# Patient Record
Sex: Male | Born: 1995 | Race: Asian | Hispanic: No | Marital: Single | Smoking: Never smoker
Health system: Southern US, Community
[De-identification: ages and names within clinical notes are randomized; demographics above are authoritative.]

## PROBLEM LIST (undated history)

## (undated) DIAGNOSIS — S83519A Sprain of anterior cruciate ligament of unspecified knee, initial encounter: Secondary | ICD-10-CM

## (undated) HISTORY — PX: WISDOM TOOTH EXTRACTION: SHX21

---
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
## 2020-03-13 ENCOUNTER — Ambulatory Visit (HOSPITAL_COMMUNITY)
Admission: EM | Admit: 2020-03-13 | Discharge: 2020-03-13 | Disposition: A | Discharge status: Home / Self Care | Payer: Self-pay | Attending: Internal Medicine | Admitting: Internal Medicine

## 2020-03-13 ENCOUNTER — Encounter (HOSPITAL_COMMUNITY): Payer: Self-pay | Admitting: *Deleted

## 2020-03-13 ENCOUNTER — Ambulatory Visit (INDEPENDENT_AMBULATORY_CARE_PROVIDER_SITE_OTHER): Payer: Self-pay

## 2020-03-13 ENCOUNTER — Other Ambulatory Visit: Payer: Self-pay

## 2020-03-13 DIAGNOSIS — S86911A Strain of unspecified muscle(s) and tendon(s) at lower leg level, right leg, initial encounter: Secondary | ICD-10-CM

## 2020-03-13 DIAGNOSIS — M25561 Pain in right knee: Secondary | ICD-10-CM

## 2020-03-13 MED ORDER — TIZANIDINE HCL 2 MG PO CAPS: 2.0000 mg | ORAL_CAPSULE | Freq: Three times a day (TID) | ORAL | 0 refills | Status: DC

## 2020-03-13 NOTE — ED Provider Notes (Signed)
[facility]    CSN: [id number] Arrival date & time: 03/13/20  1007      History   Chief Complaint Chief Complaint  Patient presents with  . Leg Pain    Rt    HPI Dean Nichols is a 25 y.o. male presenting with R knee pain and swelling x3-4 weeks, with current exacerbation. States  he was playing basketball 3 to 4 weeks ago, when he fell onto his R kneecap.  States he heard a pop when this happened.  He used a brace and this felt better; however, 3 days ago he played basketball again, and when he landed from a jump he heard another pop and immediately felt pain again.  Today states that the knee looks swollen. denies sensation changes. Has been using brace at home with some improvement. Denies injury elsewhere, head trauma, LOC, headaches. No history of prior injuries or surgeries to this knee.  HPI  History reviewed. No pertinent past medical history.  There are no problems to display for this patient.   History reviewed. No pertinent surgical history.     Home Medications    Prior to Admission medications   Medication Sig Start Date End Date Taking? Authorizing Provider  tizanidine (ZANAFLEX) 2 MG capsule Take 1 capsule (2 mg total) by mouth 3 (three) times daily. 03/13/20  Yes Rhys Martini, PA-C    Family History History reviewed. No pertinent family history.  Social History Social History   Tobacco Use  . Smoking status: Never Smoker  . Smokeless tobacco: Never Used     Allergies   Patient has no known allergies.   Review of Systems Review of Systems  Musculoskeletal:       R knee pain and swelling  All other systems reviewed and are negative.    Physical Exam Triage Vital Signs ED Triage Vitals  Enc Vitals Group     BP 03/13/20 1036 135/75     Pulse Rate 03/13/20 1036 65     Resp 03/13/20 1036 16     Temp 03/13/20 1036 98.4 F (36.9 C)     Temp Source 03/13/20 1036 Oral     SpO2 03/13/20 1036 100 %     Weight --      Height  --      Head Circumference --      Peak Flow --      Pain Score 03/13/20 1038 7     Pain Loc --      Pain Edu? --      Excl. in GC? --    No data found.  Updated Vital Signs BP 135/75 (BP Location: Right Arm)   Pulse 65   Temp 98.4 F (36.9 C) (Oral)   Resp 16   SpO2 100%   Visual Acuity Right Eye Distance:   Left Eye Distance:   Bilateral Distance:    Right Eye Near:   Left Eye Near:    Bilateral Near:     Physical Exam Vitals reviewed.  Constitutional:      Appearance: Normal appearance.  Cardiovascular:     Rate and Rhythm: Normal rate and regular rhythm.     Heart sounds: Normal heart sounds.  Pulmonary:     Effort: Pulmonary effort is normal.     Breath sounds: Normal breath sounds.  Musculoskeletal:     Right knee: Swelling and effusion present. No deformity, erythema, ecchymosis, lacerations, bony tenderness or crepitus. Normal range of motion. Tenderness present. No  medial joint line, lateral joint line, MCL, LCL, ACL, PCL or patellar tendon tenderness. No LCL laxity, MCL laxity, ACL laxity or PCL laxity. Normal alignment, normal meniscus and normal patellar mobility. Normal pulse.     Instability Tests: Anterior drawer test negative. Posterior drawer test negative. Anterior Lachman test negative. Medial McMurray test negative and lateral McMurray test negative.     Left knee: Normal.     Comments: R knee mildly swollen, TTP distal hamstring. ROM intact but pain with full extension. Sensation intact, neurovascularly intact.   Neurological:     General: No focal deficit present.     Mental Status: He is alert and oriented to person, place, and time.  Psychiatric:        Mood and Affect: Mood normal.        Behavior: Behavior normal.        Thought Content: Thought content normal.        Judgment: Judgment normal.      UC Treatments / Results  Labs (all labs ordered are listed, but only abnormal results are displayed) Labs Reviewed - No data to  display  EKG   Radiology DG Knee Complete 4 Views Right  Result Date: 03/13/2020 CLINICAL DATA:  Right knee pain. EXAM: RIGHT KNEE - COMPLETE 4+ VIEW COMPARISON:  None. FINDINGS: Right knee is located without a fracture. There appears to be a suprapatellar joint effusion. Alignment is normal. No significant joint space narrowing or degenerative changes. IMPRESSION: Probable suprapatellar joint effusion without acute bone abnormality. Electronically Signed   By: Richarda Overlie M.D.   On: 03/13/2020 11:01    Procedures Procedures (including critical care time)  Medications Ordered in UC Medications - No data to display  Initial Impression / Assessment and Plan / UC Course  I have reviewed the triage vital signs and the nursing notes.  Pertinent labs & imaging results that were available during my care of the patient were reviewed by me and considered in my medical decision making (see chart for details).      25 year old male presenting with R knee pain x3-4 weeks following injury, with repeat injury 3 days ago.  Xray R knee- Probable suprapatellar joint effusion without acute bone abnormality.  Discussed option of new knee brace today; pt prefers to use the one he already has. RICE. F/u with ortho given duration of symptoms; information provided.  Spent over 40 minutes obtaining H&P, performing physical, interpreting films, discussing results, treatment plan and plan for follow-up with patient. Patient agrees with plan.     Final Clinical Impressions(s) / UC Diagnoses   Final diagnoses:  Knee strain, right, initial encounter     Discharge Instructions     -Continue knee brace. Also try ice, resting the leg as much as possible, and tylenol/ibuprofen as needed.  -You can also try the muscle relaxer- Tizanidine (Zanaflex), up to 3x daily. This can make you drowsy so take when you don't need to drive or operate machinery. -If no improvement in 5-7 days, contact ortho for  follow-up appointment (information below).    ED Prescriptions    Medication Sig Dispense Auth. Provider   tizanidine (ZANAFLEX) 2 MG capsule Take 1 capsule (2 mg total) by mouth 3 (three) times daily. 21 capsule Rhys Martini, PA-C     PDMP not reviewed this encounter.   Rhys Martini, PA-C 03/13/20 1139

## 2020-03-13 NOTE — ED Triage Notes (Signed)
Pt reports Rt knee pain is worse over last 3 days. Pt injured Rt knee 3-4 weeks ago playing basket ball . P thas a brace on knee on arrival to room.

## 2020-03-13 NOTE — Discharge Instructions (Addendum)
-  Continue knee brace. Also try ice, resting the leg as much as possible, and tylenol/ibuprofen as needed.  -You can also try the muscle relaxer- Tizanidine (Zanaflex), up to 3x daily. This can make you drowsy so take when you don't need to drive or operate machinery. -If no improvement in 5-7 days, contact ortho for follow-up appointment (information below).

## 2020-03-19 ENCOUNTER — Ambulatory Visit (INDEPENDENT_AMBULATORY_CARE_PROVIDER_SITE_OTHER): Payer: Self-pay | Admitting: Family Medicine

## 2020-03-19 ENCOUNTER — Other Ambulatory Visit: Payer: Self-pay

## 2020-03-19 VITALS — BP 122/70 | Ht 72.0 in | Wt 155.0 lb

## 2020-03-19 DIAGNOSIS — M25561 Pain in right knee: Secondary | ICD-10-CM

## 2020-03-19 NOTE — Progress Notes (Addendum)
Office Visit Note   Patient: Dean Nichols           Date of Birth: 1995-11-05           MRN: [id number] Visit Date: 03/19/2020 Requested by: No referring provider defined for this encounter. PCP: Pcp, No  Subjective: CC: Right Knee Pain  HPI: 25 year old male presenting to clinic with concerns of approximately 1 month of right knee pain.  Patient states he was playing basketball, went up for a lay up and fell down awkwardly on his right leg.  He says he immediately had to stop playing and get a ride home.  He noticed his knee became very swollen, and had trouble walking on the leg for several days afterward.  He bought a knee brace over-the-counter, and says that this offered some support, so he decided to try playing basketball on his leg again.  He says while he was playing he felt another "pop" within his right knee, accompanied by similar severe pain, and return of significant swelling.  He then presented to the urgent care for evaluation, where he was diagnosed with a knee strain and recommended to follow-up here.  He has been wearing his knee brace, which she says offer significant improvement to his symptoms.  Denies any catching or locking of the knee.  States that his knee feels "unstable" when he is not wearing his knee brace.  He is also been trying to ice the knee, which helps with the comfort but now with the swelling.  He says otherwise he is healthy, with no additional concerns today.              ROS:   All other systems were reviewed and are negative.  Objective: Vital Signs: BP 122/70   Ht 6' (1.829 m)   Wt 155 lb (70.3 kg)   BMI 21.02 kg/m   Physical Exam:  General:  Alert and oriented, in no acute distress. Pulm:  Breathing unlabored. Psy:  Normal mood, congruent affect. Skin:  Right knee with no bruising, rashes, or erythema. Overlying skin intact.   Right knee exam:  General: Antalgic gait favoring the right leg  Seated Exam:  No significant patellar crepitus,  Negative J-Sign.   Palpation: No significant tenderness to palpation over medial or lateral joint lines. No tenderness with palpation of patella or patellar tendon. No tenderness over patellar facets.   Supine exam: Large effusion, normal patellar mobility.   Ligamentous Exam:  Endorses pain and has laxity with anterior drawer.  Left knee without pain or laxity upon anterior drawer or Lachman.  No pain or laxity with varus/valgus stress across the knee.   Meniscus:  McMurray with no pain or deep clicking.   Strength: Hip flexion (L1), Hip Aduction (L2), Knee Extension (L3) are 5/5 Bilaterally Foot Inversion (L4), Dorsiflexion (L5), and Eversion (S1) 5/5 Bilaterally  Sensation: Intact to light touch medial and lateral aspects of lower extremities, and lateral, dorsal, and medial aspects of foot.    Imaging: No results found.  Assessment & Plan: 25 year old male presenting to clinic with 1 month of right knee pain after awkward landing while playing basketball.  Examination with a large effusion as well as laxity upon Lachman's and anterior drawer.  Concern for ACL tear.   -Given his age and active lifestyle, recommend that patient speak with orthopedic surgeon to consider repair.  Referral placed to [facility], for assistance with evaluation and treatment. -Continue to wear previously obtained knee  brace.  Provided with crutches. -Patient was agreeable with plan, and no further questions or concerns today.   I was the preceptor for this visit and available for immediate consultation Marsa Aris, DO

## 2020-03-19 NOTE — Patient Instructions (Signed)
Thank you for visiting [place] today! I am concerned that you may have torn your ACL, major supportive ligament within the knee. A referral has been placed for you to see an orthopedic surgeon to assist with evaluation of this injury.  While you are waiting for this referral, continue to wear your knee brace and ice your knee.  Use crutches if you feel that you are limping.

## 2021-09-29 IMAGING — DX DG KNEE COMPLETE 4+V*R*
5 series · 5 of 5 positions shown · non-contrast
Comparison: None.

CLINICAL DATA: Right knee pain.

EXAM:
RIGHT KNEE - COMPLETE 4+ VIEW

[knee ap]
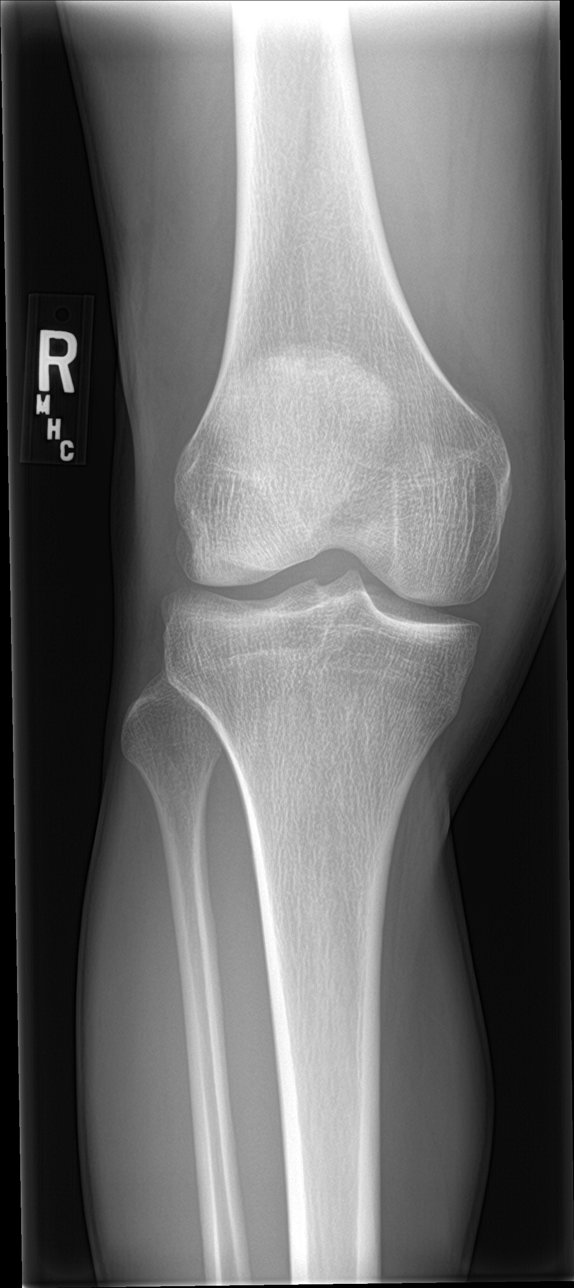

[knee obl (1 of 2)]
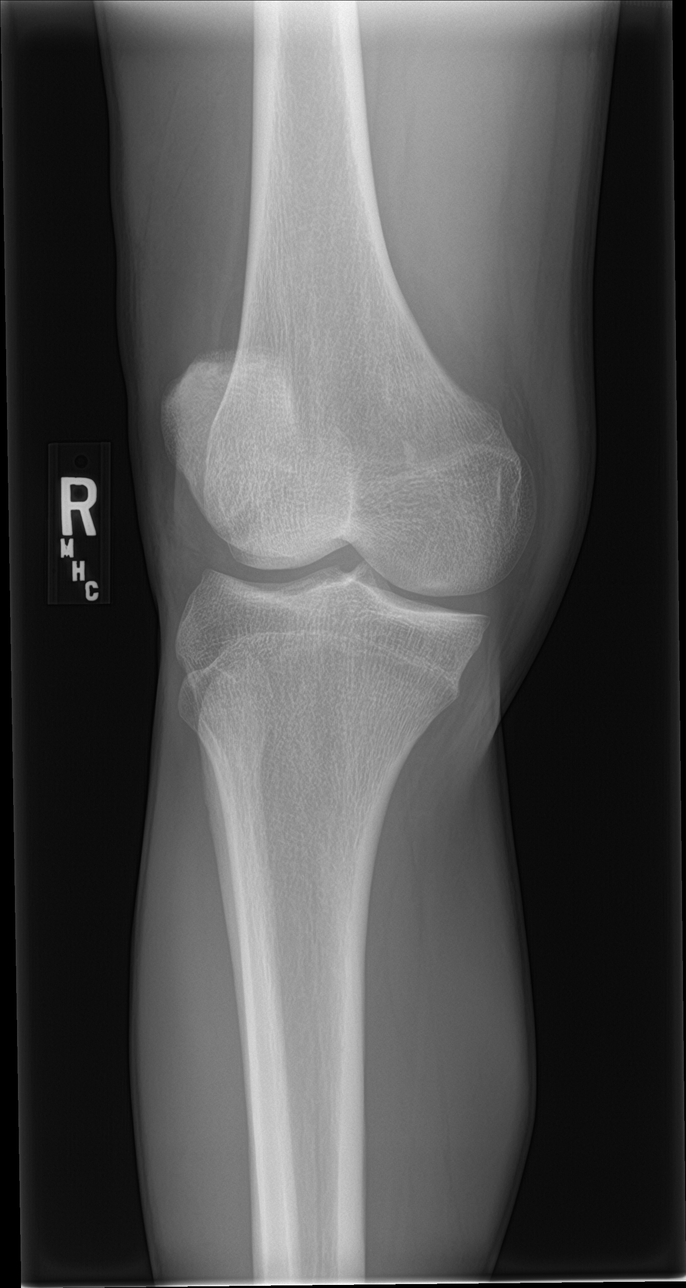

[knee obl (2 of 2)]
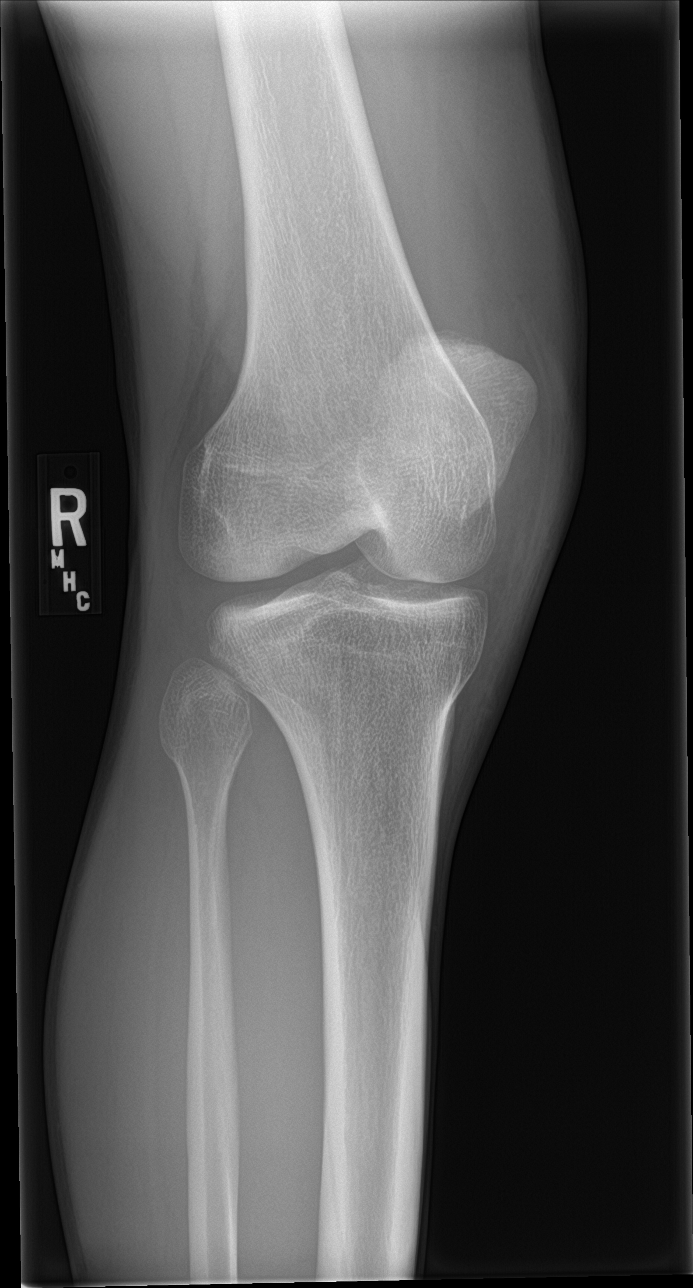

[knee lat]
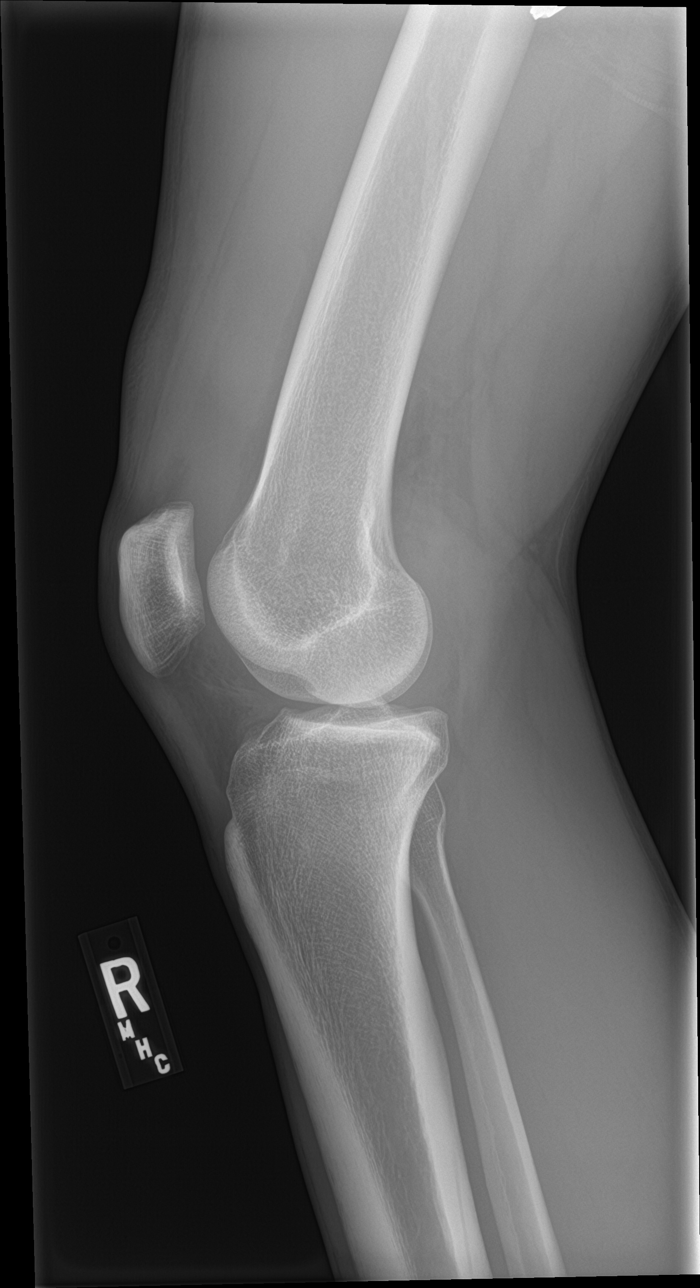

[knee sunrise]
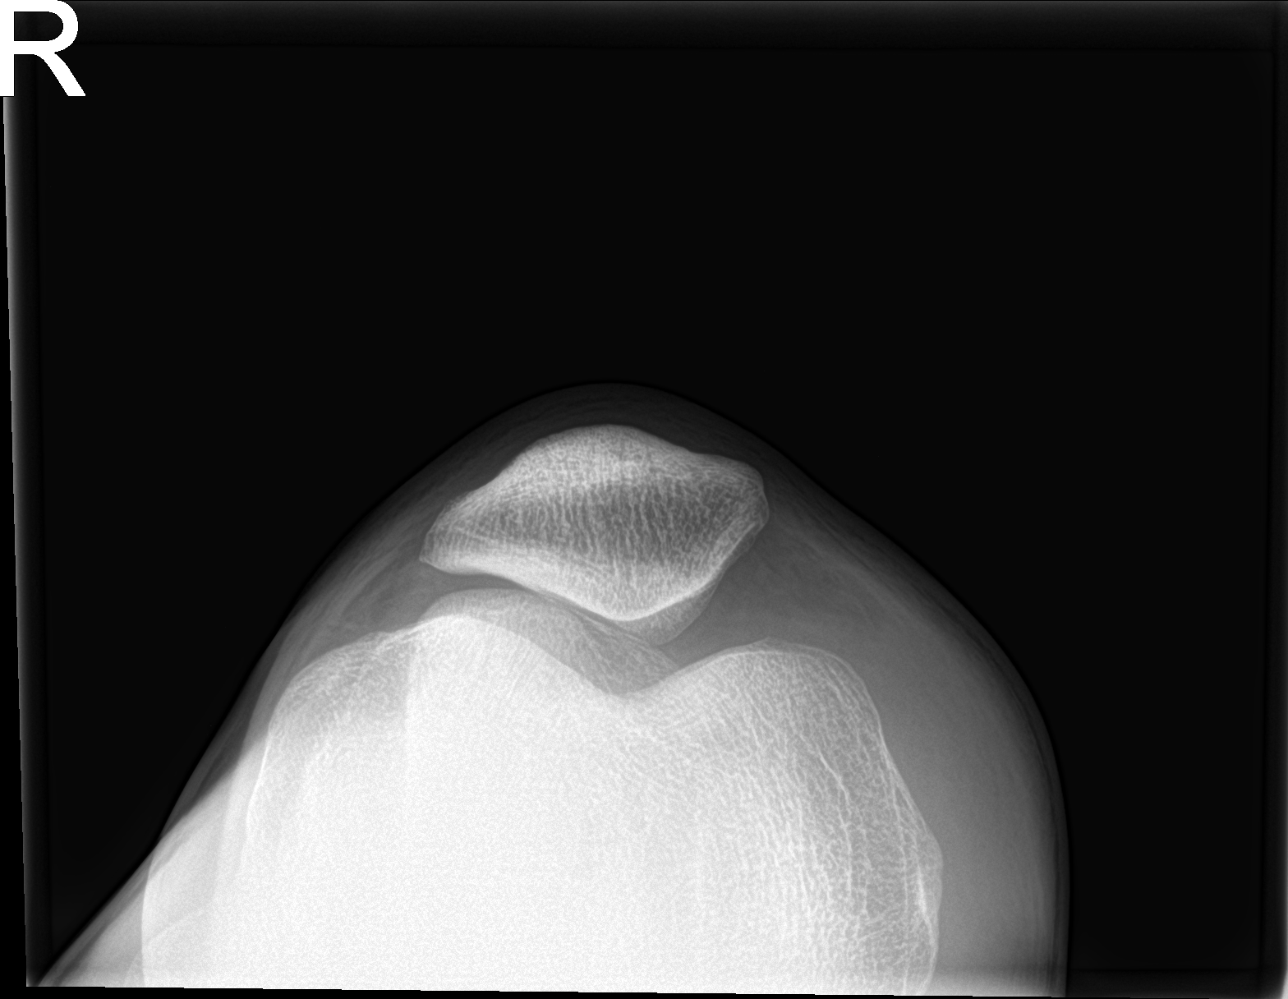

[5 of 5 positions shown; findings below may reference images not displayed]

FINDINGS: Right knee is located without a fracture. There appears to be a
suprapatellar joint effusion. Alignment is normal. No significant
joint space narrowing or degenerative changes.
IMPRESSION: Probable suprapatellar joint effusion without acute bone
abnormality.

## 2021-12-16 DIAGNOSIS — Z419 Encounter for procedure for purposes other than remedying health state, unspecified: Secondary | ICD-10-CM | POA: Diagnosis not present

## 2022-01-16 DIAGNOSIS — Z419 Encounter for procedure for purposes other than remedying health state, unspecified: Secondary | ICD-10-CM | POA: Diagnosis not present

## 2022-02-16 DIAGNOSIS — Z419 Encounter for procedure for purposes other than remedying health state, unspecified: Secondary | ICD-10-CM | POA: Diagnosis not present

## 2022-03-17 DIAGNOSIS — Z419 Encounter for procedure for purposes other than remedying health state, unspecified: Secondary | ICD-10-CM | POA: Diagnosis not present

## 2022-04-17 DIAGNOSIS — Z419 Encounter for procedure for purposes other than remedying health state, unspecified: Secondary | ICD-10-CM | POA: Diagnosis not present

## 2022-05-17 DIAGNOSIS — Z419 Encounter for procedure for purposes other than remedying health state, unspecified: Secondary | ICD-10-CM | POA: Diagnosis not present

## 2022-05-23 ENCOUNTER — Other Ambulatory Visit: Payer: Self-pay

## 2022-06-17 DIAGNOSIS — Z419 Encounter for procedure for purposes other than remedying health state, unspecified: Secondary | ICD-10-CM | POA: Diagnosis not present

## 2022-06-20 ENCOUNTER — Ambulatory Visit: Payer: Commercial Managed Care - HMO | Admitting: Family Medicine

## 2022-06-20 ENCOUNTER — Telehealth: Payer: Self-pay | Admitting: Family Medicine

## 2022-06-20 NOTE — Telephone Encounter (Signed)
6.4.24 new pt no show no letter sent but block for future schedule

## 2022-07-17 DIAGNOSIS — Z419 Encounter for procedure for purposes other than remedying health state, unspecified: Secondary | ICD-10-CM | POA: Diagnosis not present

## 2022-08-17 DIAGNOSIS — Z419 Encounter for procedure for purposes other than remedying health state, unspecified: Secondary | ICD-10-CM | POA: Diagnosis not present

## 2022-09-17 DIAGNOSIS — Z419 Encounter for procedure for purposes other than remedying health state, unspecified: Secondary | ICD-10-CM | POA: Diagnosis not present

## 2022-10-17 DIAGNOSIS — Z419 Encounter for procedure for purposes other than remedying health state, unspecified: Secondary | ICD-10-CM | POA: Diagnosis not present

## 2022-10-18 ENCOUNTER — Emergency Department (HOSPITAL_BASED_OUTPATIENT_CLINIC_OR_DEPARTMENT_OTHER): Payer: Commercial Managed Care - HMO | Admitting: Radiology

## 2022-10-18 ENCOUNTER — Inpatient Hospital Stay (HOSPITAL_BASED_OUTPATIENT_CLINIC_OR_DEPARTMENT_OTHER)
Admission: EM | Admit: 2022-10-18 | Discharge: 2022-10-30 | DRG: 853 | Disposition: A | Discharge status: Home Health | Payer: Commercial Managed Care - HMO | Attending: Family Medicine | Admitting: Family Medicine

## 2022-10-18 ENCOUNTER — Encounter (HOSPITAL_BASED_OUTPATIENT_CLINIC_OR_DEPARTMENT_OTHER): Payer: Self-pay

## 2022-10-18 ENCOUNTER — Other Ambulatory Visit: Payer: Self-pay

## 2022-10-18 ENCOUNTER — Emergency Department (HOSPITAL_BASED_OUTPATIENT_CLINIC_OR_DEPARTMENT_OTHER): Payer: Commercial Managed Care - HMO

## 2022-10-18 DIAGNOSIS — B950 Streptococcus, group A, as the cause of diseases classified elsewhere: Secondary | ICD-10-CM

## 2022-10-18 DIAGNOSIS — R6521 Severe sepsis with septic shock: Secondary | ICD-10-CM | POA: Diagnosis not present

## 2022-10-18 DIAGNOSIS — Z23 Encounter for immunization: Secondary | ICD-10-CM

## 2022-10-18 DIAGNOSIS — K7200 Acute and subacute hepatic failure without coma: Secondary | ICD-10-CM | POA: Diagnosis not present

## 2022-10-18 DIAGNOSIS — A400 Sepsis due to streptococcus, group A: Principal | ICD-10-CM | POA: Diagnosis present

## 2022-10-18 DIAGNOSIS — E876 Hypokalemia: Secondary | ICD-10-CM | POA: Diagnosis not present

## 2022-10-18 DIAGNOSIS — S81011A Laceration without foreign body, right knee, initial encounter: Secondary | ICD-10-CM | POA: Diagnosis not present

## 2022-10-18 DIAGNOSIS — A419 Sepsis, unspecified organism: Secondary | ICD-10-CM | POA: Diagnosis not present

## 2022-10-18 DIAGNOSIS — R7989 Other specified abnormal findings of blood chemistry: Secondary | ICD-10-CM | POA: Diagnosis not present

## 2022-10-18 DIAGNOSIS — W268XXA Contact with other sharp object(s), not elsewhere classified, initial encounter: Secondary | ICD-10-CM

## 2022-10-18 DIAGNOSIS — M726 Necrotizing fasciitis: Secondary | ICD-10-CM | POA: Diagnosis not present

## 2022-10-18 DIAGNOSIS — E871 Hypo-osmolality and hyponatremia: Secondary | ICD-10-CM | POA: Diagnosis present

## 2022-10-18 DIAGNOSIS — K130 Diseases of lips: Secondary | ICD-10-CM | POA: Diagnosis not present

## 2022-10-18 DIAGNOSIS — M25561 Pain in right knee: Secondary | ICD-10-CM | POA: Diagnosis not present

## 2022-10-18 DIAGNOSIS — N179 Acute kidney failure, unspecified: Secondary | ICD-10-CM | POA: Diagnosis not present

## 2022-10-18 DIAGNOSIS — R000 Tachycardia, unspecified: Secondary | ICD-10-CM | POA: Diagnosis not present

## 2022-10-18 DIAGNOSIS — M7989 Other specified soft tissue disorders: Secondary | ICD-10-CM | POA: Diagnosis not present

## 2022-10-18 DIAGNOSIS — L03115 Cellulitis of right lower limb: Secondary | ICD-10-CM | POA: Diagnosis not present

## 2022-10-18 DIAGNOSIS — L0390 Cellulitis, unspecified: Secondary | ICD-10-CM

## 2022-10-18 DIAGNOSIS — K820 Obstruction of gallbladder: Secondary | ICD-10-CM | POA: Diagnosis not present

## 2022-10-18 DIAGNOSIS — R7881 Bacteremia: Secondary | ICD-10-CM | POA: Diagnosis not present

## 2022-10-18 DIAGNOSIS — R600 Localized edema: Secondary | ICD-10-CM | POA: Diagnosis not present

## 2022-10-18 HISTORY — DX: Sprain of anterior cruciate ligament of unspecified knee, initial encounter: S83.519A

## 2022-10-18 LAB — CBC WITH DIFFERENTIAL/PLATELET
Abs Immature Granulocytes: 0.9 10*3/uL — ABNORMAL HIGH (ref 0.00–0.07)
Band Neutrophils: 20 %
Basophils Absolute: 0 10*3/uL (ref 0.0–0.1)
Basophils Relative: 0 %
Eosinophils Absolute: 0 10*3/uL (ref 0.0–0.5)
Eosinophils Relative: 0 %
HCT: 44.9 % (ref 39.0–52.0)
Hemoglobin: 16.2 g/dL (ref 13.0–17.0)
Lymphocytes Relative: 0 %
Lymphs Abs: 0 10*3/uL — ABNORMAL LOW (ref 0.7–4.0)
MCH: 31.2 pg (ref 26.0–34.0)
MCHC: 36.1 g/dL — ABNORMAL HIGH (ref 30.0–36.0)
MCV: 86.3 fL (ref 80.0–100.0)
Metamyelocytes Relative: 4 %
Monocytes Absolute: 0.7 10*3/uL (ref 0.1–1.0)
Monocytes Relative: 4 %
Myelocytes: 1 %
Neutro Abs: 16.3 10*3/uL — ABNORMAL HIGH (ref 1.7–7.7)
Neutrophils Relative %: 71 %
Platelets: 227 10*3/uL (ref 150–400)
RBC Morphology: NONE SEEN
RBC: 5.2 MIL/uL (ref 4.22–5.81)
RDW: 12.2 % (ref 11.5–15.5)
WBC Morphology: INCREASED
WBC: 17.9 10*3/uL — ABNORMAL HIGH (ref 4.0–10.5)
nRBC: 0 % (ref 0.0–0.2)

## 2022-10-18 LAB — COMPREHENSIVE METABOLIC PANEL
ALT: 19 U/L (ref 0–44)
AST: 21 U/L (ref 15–41)
Albumin: 4.6 g/dL (ref 3.5–5.0)
Alkaline Phosphatase: 70 U/L (ref 38–126)
Anion gap: 13 (ref 5–15)
BUN: 25 mg/dL — ABNORMAL HIGH (ref 6–20)
CO2: 25 mmol/L (ref 22–32)
Calcium: 9.5 mg/dL (ref 8.9–10.3)
Chloride: 93 mmol/L — ABNORMAL LOW (ref 98–111)
Creatinine, Ser: 1.5 mg/dL — ABNORMAL HIGH (ref 0.61–1.24)
GFR, Estimated: >60 mL/min (ref >60)
Glucose, Bld: 113 mg/dL — ABNORMAL HIGH (ref 70–99)
Potassium: 3.7 mmol/L (ref 3.5–5.1)
Sodium: 131 mmol/L — ABNORMAL LOW (ref 135–145)
Total Bilirubin: 1.2 mg/dL (ref 0.3–1.2)
Total Protein: 8.5 g/dL — ABNORMAL HIGH (ref 6.5–8.1)

## 2022-10-18 LAB — CBC
HCT: 37.9 % — ABNORMAL LOW (ref 39.0–52.0)
Hemoglobin: 13.8 g/dL (ref 13.0–17.0)
MCH: 31.4 pg (ref 26.0–34.0)
MCHC: 36.4 g/dL — ABNORMAL HIGH (ref 30.0–36.0)
MCV: 86.1 fL (ref 80.0–100.0)
Platelets: 174 10*3/uL (ref 150–400)
RBC: 4.4 MIL/uL (ref 4.22–5.81)
RDW: 12.1 % (ref 11.5–15.5)
WBC: 13.9 10*3/uL — ABNORMAL HIGH (ref 4.0–10.5)
nRBC: 0 % (ref 0.0–0.2)

## 2022-10-18 LAB — URINALYSIS, W/ REFLEX TO CULTURE (INFECTION SUSPECTED)
Bacteria, UA: NONE SEEN
Bilirubin Urine: NEGATIVE
Glucose, UA: NEGATIVE mg/dL
Nitrite: NEGATIVE
Protein, ur: 30 mg/dL — AB
Specific Gravity, Urine: 1.028 (ref 1.005–1.030)
Trans Epithel, UA: <1
pH: 6.5 (ref 5.0–8.0)

## 2022-10-18 LAB — LACTIC ACID, PLASMA
Lactic Acid, Venous: 3.1 mmol/L (ref 0.5–1.9)
Lactic Acid, Venous: 2 mmol/L (ref 0.5–1.9)

## 2022-10-18 LAB — CREATININE, SERUM
Creatinine, Ser: 1.43 mg/dL — ABNORMAL HIGH (ref 0.61–1.24)
GFR, Estimated: >60 mL/min (ref >60)

## 2022-10-18 LAB — PROTIME-INR
INR: 1.4 — ABNORMAL HIGH (ref 0.8–1.2)
Prothrombin Time: 17 s — ABNORMAL HIGH (ref 11.4–15.2)

## 2022-10-18 LAB — C-REACTIVE PROTEIN: CRP: 34.2 mg/dL — ABNORMAL HIGH (ref <1.0)

## 2022-10-18 LAB — HIV ANTIBODY (ROUTINE TESTING W REFLEX): HIV Screen 4th Generation wRfx: NONREACTIVE

## 2022-10-18 LAB — SEDIMENTATION RATE: Sed Rate: 26 mm/h — ABNORMAL HIGH (ref 0–16)

## 2022-10-18 MED ORDER — SODIUM CHLORIDE 0.9 % IV BOLUS
1000.0000 mL | Freq: Once | INTRAVENOUS | Status: AC
  Administered 2022-10-18: 1000 mL via INTRAVENOUS

## 2022-10-18 MED ORDER — ACETAMINOPHEN 650 MG RE SUPP: 650.0000 mg | Freq: Four times a day (QID) | RECTAL | Status: DC | PRN

## 2022-10-18 MED ORDER — ONDANSETRON HCL 4 MG/2ML IJ SOLN: 4.0000 mg | Freq: Four times a day (QID) | INTRAMUSCULAR | Status: DC | PRN

## 2022-10-18 MED ORDER — MORPHINE SULFATE (PF) 4 MG/ML IV SOLN
4.0000 mg | Freq: Once | INTRAVENOUS | Status: AC
  Administered 2022-10-18: 4 mg via INTRAVENOUS
  Filled 2022-10-18: qty 1

## 2022-10-18 MED ORDER — LACTATED RINGERS IV SOLN
150.0000 mL/h | INTRAVENOUS | Status: DC
  Administered 2022-10-18 – 2022-10-19 (×2): 150 mL/h via INTRAVENOUS

## 2022-10-18 MED ORDER — METRONIDAZOLE 500 MG/100ML IV SOLN
500.0000 mg | Freq: Once | INTRAVENOUS | Status: AC
  Administered 2022-10-18: 500 mg via INTRAVENOUS
  Filled 2022-10-18: qty 100

## 2022-10-18 MED ORDER — ACETAMINOPHEN 325 MG PO TABS
650.0000 mg | ORAL_TABLET | Freq: Four times a day (QID) | ORAL | Status: DC | PRN
  Administered 2022-10-19 – 2022-10-20 (×5): 650 mg via ORAL
  Filled 2022-10-18 (×5): qty 2

## 2022-10-18 MED ORDER — SODIUM CHLORIDE 0.9 % IV SOLN
2.0000 g | Freq: Once | INTRAVENOUS | Status: AC
  Administered 2022-10-18: 2 g via INTRAVENOUS
  Filled 2022-10-18: qty 12.5

## 2022-10-18 MED ORDER — LACTATED RINGERS IV BOLUS
500.0000 mL | Freq: Once | INTRAVENOUS | Status: AC
  Administered 2022-10-18: 500 mL via INTRAVENOUS

## 2022-10-18 MED ORDER — ACETAMINOPHEN 500 MG PO TABS
1000.0000 mg | ORAL_TABLET | Freq: Once | ORAL | Status: AC
  Administered 2022-10-18: 1000 mg via ORAL
  Filled 2022-10-18: qty 2

## 2022-10-18 MED ORDER — VANCOMYCIN HCL IN DEXTROSE 1-5 GM/200ML-% IV SOLN
1000.0000 mg | Freq: Two times a day (BID) | INTRAVENOUS | Status: DC
  Administered 2022-10-19: 1000 mg via INTRAVENOUS
  Filled 2022-10-18: qty 200

## 2022-10-18 MED ORDER — LACTATED RINGERS IV BOLUS (SEPSIS)
1220.0000 mL | Freq: Once | INTRAVENOUS | Status: AC
  Administered 2022-10-18: 1220 mL via INTRAVENOUS

## 2022-10-18 MED ORDER — KETOROLAC TROMETHAMINE 15 MG/ML IJ SOLN
15.0000 mg | Freq: Four times a day (QID) | INTRAMUSCULAR | Status: AC | PRN
  Administered 2022-10-19 – 2022-10-23 (×4): 15 mg via INTRAVENOUS
  Filled 2022-10-18 (×5): qty 1

## 2022-10-18 MED ORDER — ENOXAPARIN SODIUM 40 MG/0.4ML IJ SOSY
40.0000 mg | PREFILLED_SYRINGE | INTRAMUSCULAR | Status: DC
  Administered 2022-10-19 – 2022-10-24 (×6): 40 mg via SUBCUTANEOUS
  Filled 2022-10-18 (×7): qty 0.4

## 2022-10-18 MED ORDER — ONDANSETRON HCL 4 MG PO TABS
4.0000 mg | ORAL_TABLET | Freq: Four times a day (QID) | ORAL | Status: DC | PRN
  Administered 2022-10-20: 4 mg via ORAL
  Filled 2022-10-18: qty 1

## 2022-10-18 MED ORDER — IBUPROFEN 800 MG PO TABS
800.0000 mg | ORAL_TABLET | Freq: Once | ORAL | Status: AC
  Administered 2022-10-18: 800 mg via ORAL
  Filled 2022-10-18: qty 1

## 2022-10-18 MED ORDER — VANCOMYCIN HCL IN DEXTROSE 1-5 GM/200ML-% IV SOLN: 1000.0000 mg | Freq: Once | INTRAVENOUS | Status: DC

## 2022-10-18 MED ORDER — SODIUM CHLORIDE 0.9 % IV SOLN
2.0000 g | Freq: Three times a day (TID) | INTRAVENOUS | Status: DC
  Administered 2022-10-19 (×2): 2 g via INTRAVENOUS
  Filled 2022-10-18 (×2): qty 12.5

## 2022-10-18 MED ORDER — VANCOMYCIN HCL IN DEXTROSE 1-5 GM/200ML-% IV SOLN
1000.0000 mg | Freq: Once | INTRAVENOUS | Status: AC
  Administered 2022-10-18: 1000 mg via INTRAVENOUS
  Filled 2022-10-18: qty 200

## 2022-10-18 MED ORDER — TETANUS-DIPHTH-ACELL PERTUSSIS 5-2.5-18.5 LF-MCG/0.5 IM SUSY
0.5000 mL | PREFILLED_SYRINGE | Freq: Once | INTRAMUSCULAR | Status: AC
  Administered 2022-10-18: 0.5 mL via INTRAMUSCULAR
  Filled 2022-10-18: qty 0.5

## 2022-10-18 MED ORDER — LACTATED RINGERS IV SOLN: INTRAVENOUS | Status: AC

## 2022-10-18 NOTE — ED Notes (Signed)
Attempted to call report again to [facility]. Was placed on hold for 5+ minutes with no further answer. Attempted to call again, no answer from unit. [facility] given report and pt being transported at this time.

## 2022-10-18 NOTE — ED Triage Notes (Addendum)
Send from urgent care for further eval of right knee swelling, pain, erythema, and possible infection. Cut his knee Monday with a metal pipe. No medical treatment previous to today. Chills at home intermittently. Nausea and vomiting last night. Last dose ibuprofen 1000 today and last dose "fever medication" 0530 this am. BP at urgent care 92/70 and HR 124.

## 2022-10-18 NOTE — ED Notes (Signed)
Blood cultures x2 collected before administration of antibiotics.

## 2022-10-18 NOTE — Sepsis Progress Note (Signed)
[facility] is following this Code Sepsis. °

## 2022-10-18 NOTE — Progress Notes (Signed)
Pharmacy Antibiotic Note  Dean Nichols is a 27 y.o. male for which pharmacy has been consulted for cefepime and vancomycin dosing for cellulitis.  SCr 1.5 WBC 17.9; LA 3.1; T 98.3; HR 122; RR 22  Plan: Cefepime 2g q8hr  Vancomycin 1000 mg given in ED --Plan for 1000 mg q12hr (eAUC 521.6) unless change in renal function Monitor WBC, fever, renal function, cultures De-escalate when able Levels at steady state  Height: 6' (182.9 cm) Weight: 74.8 kg (165 lb) IBW/kg (Calculated) : 77.6  Temp (24hrs), Avg:98.3 F (36.8 C), Min:98.3 F (36.8 C), Max:98.3 F (36.8 C)  Recent Labs  Lab 10/18/22 1527  WBC 17.9*  CREATININE 1.50*  LATICACIDVEN 3.1*    Estimated Creatinine Clearance: 77.6 mL/min (A) (by C-G formula based on SCr of 1.5 mg/dL (H)).    No Known Allergies  Microbiology results: Pending  Thank you for allowing pharmacy to be a part of this patient's care.  Delmar Landau, PharmD, BCPS 10/18/2022 4:28 PM ED Clinical Pharmacist -  [phone]

## 2022-10-18 NOTE — ED Notes (Signed)
CRITICAL VALUE STICKER  CRITICAL VALUE:3.1 lactic  RECEIVER (on-site recipient of call):Melodye Ped  DATE & TIME NOTIFIED: 10/18/2022 now  MESSENGER (representative from lab):  MD NOTIFIED: horton, kristie  TIME OF NOTIFICATION:4:17 PM   RESPONSE:

## 2022-10-18 NOTE — H&P (Signed)
History and Physical    Patient: Dean Nichols:[id number] DOB: [birth year] DOA: 10/18/2022 DOS: the patient was seen and examined on 10/18/2022 PCP: Pcp, No  Patient coming from: Home  Chief Complaint:  Chief Complaint  Patient presents with   Knee Pain   HPI: Dean Nichols is a 27 y.o. 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  He was working in the yard when he accidentally cut his leg around the lateral aspect of the right knee.  He was with a metal.  Subsequently started having pain and swelling.  Also redness of the knee.  This is associated with fever and some pain.  Patient went to the [facility] emergency room today where he was seen and evaluated.  His knee appeared to be markedly swollen.  Warm, red and tender.  Initially suspicion for septic arthritis was made.  After workup however there was no evidence of septic arthritis.  Patient appears to have cellulitis of the overlying skin and soft tissue of the right knee.  He also meets sepsis criteria with his white count and temperature.  As a result patient is being admitted to the hospital for full sepsis workup.  He is currently hemodynamically stable.  No evidence of sepsis or septic shock  Review of Systems: As mentioned in the history of present illness. All other systems reviewed and are negative. Past Medical History:  Diagnosis Date   ACL tear    right   History reviewed. No pertinent surgical history. Social History:  reports that he has never smoked. He has never used smokeless tobacco. He reports that he does not currently use alcohol. He reports current drug use. Drug: Marijuana.  No Known Allergies  History reviewed. No pertinent family history.  Prior to Admission medications   Medication Sig Start Date End Date Taking? Authorizing Provider  tizanidine (ZANAFLEX) 2 MG capsule  Take 1 capsule (2 mg total) by mouth 3 (three) times daily. 03/13/20   Rhys Martini, PA-C    Physical Exam: Vitals:   10/18/22 1920 10/18/22 1928 10/18/22 1930 10/18/22 2016  BP: (!) 99/51  (!) 103/57 107/60  Pulse: (!) 120  (!) 115 (!) 107  Resp: (!) 23  19 18   Temp:  (!) 102.4 F (39.1 C)  99.7 F (37.6 C)  TempSrc:  Oral  Oral  SpO2: 100%  100% 100%  Weight:      Height:       Constitutional: NAD, calm, comfortable Eyes: PERRL, lids and conjunctivae normal ENMT: Mucous membranes are moist. Posterior pharynx clear of any exudate or lesions.Normal dentition.  Neck: normal, supple, no masses, no thyromegaly Respiratory: clear to auscultation bilaterally, no wheezing, no crackles. Normal respiratory effort. No accessory muscle use.  Cardiovascular: Regular rate and rhythm, no murmurs / rubs / gallops. No extremity edema. 2+ pedal pulses. No carotid bruits.  Abdomen: no tenderness, no masses palpated. No hepatosplenomegaly. Bowel sounds positive.  Musculoskeletal: Right knee swollen, decreased range of motion, tender, red, and warm, Skin: no rashes, lesions, ulcers. No induration Neurologic: CN 2-12 grossly intact. Sensation intact, DTR normal. Strength 5/5 in all 4.  Psychiatric: Normal judgment and insight. Alert and oriented x 3. Normal mood  Data Reviewed:  Temperature is 102.4, blood pressure 99/51, pulse 120, respirate 23, lactic acid 2.0, CRP of 34.2, white count 13.9, hemoglobin 13.8 and platelets 174.  Sed rate  of 26.  HIV screen negative.  Doppler ultrasound showed no DVT.  X-ray of the knee showed no active disease.  PT 17.0 INR 1.4.  Sodium 131, chloride 93, glucose 113 creatinine 1.50.  Assessment and Plan:  #1 sepsis due to cellulitis: Patient has sepsis due to cellulitis.  At this point no evidence of septic arthritis.  Patient will be admitted for IV antibiotics.  Treatment of sepsis per protocol.  Initiate cefepime and vancomycin.  Orthopedic consult if there is  evidence of abscess.  At this point no clear-cut evidence of abscess.  Patient has been evaluated for tetanus shot and updated in the ER.  #2 AKI: Most likely prerenal.  Hydrate and monitor renal function.  #3 hyponatremia: Continue to hydrate.    Advance Care Planning:   Code Status: Full Code   Consults: None  Family Communication: Mother at bedside  Severity of Illness: The appropriate patient status for this patient is INPATIENT. Inpatient status is judged to be reasonable and necessary in order to provide the required intensity of service to ensure the patient's safety. The patient's presenting symptoms, physical exam findings, and initial radiographic and laboratory data in the context of their chronic comorbidities is felt to place them at high risk for further clinical deterioration. Furthermore, it is not anticipated that the patient will be medically stable for discharge from the hospital within 2 midnights of admission.   * I certify that at the point of admission it is my clinical judgment that the patient will require inpatient hospital care spanning beyond 2 midnights from the point of admission due to high intensity of service, high risk for further deterioration and high frequency of surveillance required.*  AuthorLonia Blood, MD 10/18/2022 8:34 PM  For on call review [email or web address].

## 2022-10-18 NOTE — ED Provider Notes (Signed)
Dean Nichols   CSN: [id number] Arrival date & time: 10/18/22  1502     History  Chief Complaint  Patient presents with   Knee Pain    Dean Nichols is a 27 y.o. male.  HPI   27 year old male presents emergency department with right knee injury.  Patient states 2 days ago he cut the lateral side of his right knee on a metal pipe.  Does not know when his last tetanus was.  He states since then he has been developing redness and pain around the right knee with some redness tracking up his medial thigh.  He denies any other injury to the knee.  Denies any discoloration or numbness/coldness of the right foot.  He endorses fever, chills, decreased appetite and episode of nausea/vomiting.  Home Medications Prior to Admission medications   Medication Sig Start Date End Date Taking? Authorizing Provider  tizanidine (ZANAFLEX) 2 MG capsule Take 1 capsule (2 mg total) by mouth 3 (three) times daily. 03/13/20   Rhys Martini, PA-C      Allergies    Patient has no known allergies.    Review of Systems   Review of Systems  Constitutional:  Positive for chills, fatigue and fever.  Respiratory:  Negative for shortness of breath.   Cardiovascular:  Negative for chest pain.  Gastrointestinal:  Negative for abdominal pain, diarrhea and vomiting.  Musculoskeletal:        + Right knee pain and swelling  Skin:        Overlying cellulitis, warmth involving the right knee, posterior calf as well as streaking up the right thigh  Neurological:  Negative for headaches.    Physical Exam Updated Vital Signs BP 121/75 (BP Location: Right Arm)   Pulse (!) 126   Temp 98.3 F (36.8 C) (Oral)   Resp (!) 24   Ht 6' (1.829 m)   Wt 74.8 kg   SpO2 100%   BMI 22.38 kg/m  Physical Exam Vitals and nursing Nichols reviewed.  Constitutional:      General: He is not in acute distress.    Appearance: Normal appearance.  HENT:     Head:  Normocephalic.     Mouth/Throat:     Mouth: Mucous membranes are moist.  Cardiovascular:     Rate and Rhythm: Normal rate.  Pulmonary:     Effort: Pulmonary effort is normal. No respiratory distress.  Abdominal:     Palpations: Abdomen is soft.     Tenderness: There is no abdominal tenderness.  Musculoskeletal:     Comments: The right lower lateral knee is erythematous, with cellulitic changes involving the posterior knee cap area and wrapping around laterally to the posterior calf/popliteal fossa.  There is also redness streaking up the right mid thigh.  Patient is able to fully flex the knee actively and has minimal pain with passive range of motion in flexion all the way to 90 degrees.  There is a approximately 3 cm linear laceration to the right lateral knee that has begun healing.  He also has multiple scabs to the anterior right shin.  Normal peripheral perfusion.  Skin:    General: Skin is warm.  Neurological:     Mental Status: He is alert and oriented to person, place, and time. Mental status is at baseline.  Psychiatric:        Mood and Affect: Mood normal.  ED Results / Procedures / Treatments   Labs (all labs ordered are listed, but only abnormal results are displayed) Labs Reviewed  CBC WITH DIFFERENTIAL/PLATELET - Abnormal; Notable for the following components:      Result Value   WBC 17.9 (*)    MCHC 36.1 (*)    All other components within normal limits  CULTURE, BLOOD (ROUTINE X 2)  CULTURE, BLOOD (ROUTINE X 2)  COMPREHENSIVE METABOLIC PANEL  LACTIC ACID, PLASMA  LACTIC ACID, PLASMA  PROTIME-INR  URINALYSIS, W/ REFLEX TO CULTURE (INFECTION SUSPECTED)  C-REACTIVE PROTEIN  SEDIMENTATION RATE    EKG EKG Interpretation Date/Time:  Wednesday October 18 2022 15:15:43 EDT Ventricular Rate:  122 PR Interval:  122 QRS Duration:  90 QT Interval:  294 QTC Calculation: 419 R Axis:   86  Text Interpretation: Sinus tachycardia Consider right  atrial enlargement ST elev, probable normal early repol pattern Confirmed by Coralee Pesa [phone]) on 10/18/2022 3:37:25 PM  Radiology No results found.  Procedures .Critical Care  Performed by: Rozelle Logan, DO Authorized by: Rozelle Logan, DO   Critical care provider statement:    Critical care time (minutes):  45   Critical care time was exclusive of:  Separately billable procedures and treating other patients   Critical care was necessary to treat or prevent imminent or life-threatening deterioration of the following conditions:  Sepsis   Critical care was time spent personally by me on the following activities:  Development of treatment plan with patient or surrogate, discussions with consultants, evaluation of patient's response to treatment, examination of patient, ordering and review of laboratory studies, ordering and review of radiographic studies, ordering and performing treatments and interventions, pulse oximetry, re-evaluation of patient's condition and review of old charts   I assumed direction of critical care for this patient from another provider in my specialty: no     Care discussed with: admitting provider       Medications Ordered in ED Medications  sodium chloride 0.9 % bolus 1,000 mL (has no administration in time range)  morphine (PF) 4 MG/ML injection 4 mg (has no administration in time range)    ED Course/ Medical Decision Making/ A&P                                 Medical Decision Making Amount and/or Complexity of Data Reviewed Labs: ordered. Radiology: ordered.  Risk OTC drugs. Prescription drug management. Decision regarding hospitalization.   27 year old male presents emergency department with redness and warmth of the right lateral knee secondary to a laceration he sustained 2 days ago.  Tetanus was updated at this visit.  He is tachycardic but stable blood pressure, not acutely ill-appearing.  The right knee has findings of  cellulitis which wraps around to the popliteal fossa and upper posterior calf.  He also has a linear area of cellulitis tracking in the medial thigh.  Otherwise the knee itself is not swollen, the patella is nontender.  He is able to actively and passively range it with full motion.  I doubt septic arthritis at this time.  Blood work shows a leukocytosis and AKI.  DVT study is negative.  X-ray shows no effusion or other bony findings.  Again lower suspicion for septic arthritis even though his ESR is slightly elevated.  Code sepsis placed and patient hydrated, culture sent, initial lactic over 3 but downtrending to 2.  Antibiotics already given and we  will plan for admission secondary to sepsis.  Patients evaluation and results requires admission for further treatment and care.  Spoke with hospitalist, reviewed patient's ED course and they accept admission.  Patient agrees with admission plan, offers no new complaints and is stable/unchanged at time of admit.        Final Clinical Impression(s) / ED Diagnoses Final diagnoses:  None    Rx / DC Orders ED Discharge Orders     None         Rozelle Logan, DO 10/18/22 1846

## 2022-10-18 NOTE — ED Notes (Signed)
Attempted to call report x1, no nurse available or assigned to pt at this time per 2W. Will call back in 10 min as requested by 2W.

## 2022-10-18 NOTE — ED Notes (Signed)
Blood Cultures drawn before start of antibotics

## 2022-10-18 NOTE — ED Notes (Signed)
Patient given another 500cc bolus per Dr. Wilkie Aye

## 2022-10-19 ENCOUNTER — Inpatient Hospital Stay (HOSPITAL_COMMUNITY): Payer: Commercial Managed Care - HMO

## 2022-10-19 DIAGNOSIS — L0390 Cellulitis, unspecified: Secondary | ICD-10-CM | POA: Diagnosis not present

## 2022-10-19 DIAGNOSIS — N179 Acute kidney failure, unspecified: Secondary | ICD-10-CM | POA: Diagnosis not present

## 2022-10-19 DIAGNOSIS — R7881 Bacteremia: Secondary | ICD-10-CM

## 2022-10-19 DIAGNOSIS — B950 Streptococcus, group A, as the cause of diseases classified elsewhere: Secondary | ICD-10-CM | POA: Diagnosis not present

## 2022-10-19 DIAGNOSIS — A419 Sepsis, unspecified organism: Secondary | ICD-10-CM | POA: Diagnosis not present

## 2022-10-19 LAB — CBC
HCT: 34.6 % — ABNORMAL LOW (ref 39.0–52.0)
Hemoglobin: 12.4 g/dL — ABNORMAL LOW (ref 13.0–17.0)
MCH: 30.6 pg (ref 26.0–34.0)
MCHC: 35.8 g/dL (ref 30.0–36.0)
MCV: 85.4 fL (ref 80.0–100.0)
Platelets: 155 10*3/uL (ref 150–400)
RBC: 4.05 MIL/uL — ABNORMAL LOW (ref 4.22–5.81)
RDW: 12.5 % (ref 11.5–15.5)
WBC: 11.9 10*3/uL — ABNORMAL HIGH (ref 4.0–10.5)
nRBC: 0 % (ref 0.0–0.2)

## 2022-10-19 LAB — COMPREHENSIVE METABOLIC PANEL
ALT: 36 U/L (ref 0–44)
AST: 48 U/L — ABNORMAL HIGH (ref 15–41)
Albumin: 2.5 g/dL — ABNORMAL LOW (ref 3.5–5.0)
Alkaline Phosphatase: 54 U/L (ref 38–126)
Anion gap: 11 (ref 5–15)
BUN: 17 mg/dL (ref 6–20)
CO2: 25 mmol/L (ref 22–32)
Calcium: 8 mg/dL — ABNORMAL LOW (ref 8.9–10.3)
Chloride: 98 mmol/L (ref 98–111)
Creatinine, Ser: 1.47 mg/dL — ABNORMAL HIGH (ref 0.61–1.24)
GFR, Estimated: >60 mL/min (ref >60)
Glucose, Bld: 113 mg/dL — ABNORMAL HIGH (ref 70–99)
Potassium: 3.7 mmol/L (ref 3.5–5.1)
Sodium: 134 mmol/L — ABNORMAL LOW (ref 135–145)
Total Bilirubin: 1.1 mg/dL (ref 0.3–1.2)
Total Protein: 5.4 g/dL — ABNORMAL LOW (ref 6.5–8.1)

## 2022-10-19 LAB — BLOOD CULTURE ID PANEL (REFLEXED) - BCID2

## 2022-10-19 LAB — PROTIME-INR
INR: 1.5 — ABNORMAL HIGH (ref 0.8–1.2)
Prothrombin Time: 18.8 s — ABNORMAL HIGH (ref 11.4–15.2)

## 2022-10-19 LAB — PROCALCITONIN: Procalcitonin: 45.04 ng/mL

## 2022-10-19 LAB — CORTISOL-AM, BLOOD: Cortisol - AM: 26 ug/dL — ABNORMAL HIGH (ref 6.7–22.6)

## 2022-10-19 MED ORDER — LACTATED RINGERS IV SOLN: INTRAVENOUS | Status: DC

## 2022-10-19 MED ORDER — LACTATED RINGERS IV BOLUS
1000.0000 mL | Freq: Once | INTRAVENOUS | Status: AC
  Administered 2022-10-19: 1000 mL via INTRAVENOUS

## 2022-10-19 MED ORDER — LINEZOLID 600 MG/300ML IV SOLN
600.0000 mg | Freq: Two times a day (BID) | INTRAVENOUS | Status: DC
  Administered 2022-10-19 – 2022-10-20 (×3): 600 mg via INTRAVENOUS
  Filled 2022-10-19 (×3): qty 300

## 2022-10-19 MED ORDER — LACTATED RINGERS IV BOLUS
500.0000 mL | Freq: Once | INTRAVENOUS | Status: AC
  Administered 2022-10-19: 500 mL via INTRAVENOUS

## 2022-10-19 MED ORDER — PENICILLIN G POTASSIUM 20000000 UNITS IJ SOLR
12.0000 10*6.[IU] | Freq: Two times a day (BID) | INTRAVENOUS | Status: DC
  Administered 2022-10-19 – 2022-10-30 (×22): 12 10*6.[IU] via INTRAVENOUS
  Filled 2022-10-19 (×26): qty 12

## 2022-10-19 NOTE — Plan of Care (Signed)
  Problem: Education: Goal: Knowledge of General Education information will improve Description: Including pain rating scale, medication(s)/side effects and non-pharmacologic comfort measures Outcome: Progressing   Problem: Health Behavior/Discharge Planning: Goal: Ability to manage health-related needs will improve Outcome: Progressing   Problem: Activity: Goal: Risk for activity intolerance will decrease Outcome: Progressing   Problem: Coping: Goal: Level of anxiety will decrease Outcome: Progressing   Problem: Elimination: Goal: Will not experience complications related to bowel motility Outcome: Progressing   Problem: Safety: Goal: Ability to remain free from injury will improve Outcome: Progressing   Problem: Skin Integrity: Goal: Risk for impaired skin integrity will decrease Outcome: Progressing   

## 2022-10-19 NOTE — Consult Note (Signed)
[facility]    Date of Admission:  10/18/2022     Total days of antibiotics 2  Penicillin 10/3 >> c   Linezolid 10/3 >> c   Vancomycin x 1 + Cefepime x 3 dose                Reason for Consult: GAS Bacteremia     Referring Provider: [name]  Primary Care Provider: Pcp, No   Assessment: Dean Nichols is a 27 y.o. male otherwise healthy presenting with acute abrupt illness after laceration of right knee found to have:    Group A Streptococcus Bacteremia -  -Start continuous IV penicillin infusion  -Start linezolid to assist with toxin inhibition - can do IV today and if he proves to avoid vomiting can switch this to PO tomorrow.  -Not typical offender for endocarditis given pyogenic strep - don't feel strongly we need to pursue endocarditis work up at this time    Right Knee / LE Cellulitis 2/2 Wound -  -Knee joint does not seem involved as he is able to bear weight and perform ROM to at least 90d. Feels more tight around the knee vs in it. Ortho has seen and also don't suspect intraarticular infection.  -Continue to follow on IV antibiotics  -Wounds on knee and anterior shin are dry appearing but with surrounding cellulitis.  -A1C in AM with chronic LE wound    SIRS  -  -HR 101 and BPs hanging Systolic 90s and low 100s. Lactate 2.0 -Follow for improvement, IVF as directed by [name].     Plan: IV penicillin and IV linezolid  Follow for improvement in nausea to time switch to oral linezolid to continue 48-72h for toxin inhibition  Agree no concern for intraarticular infection on exam Rt knee - follow for changes  IVF / management per [name].      Principal Problem:   Sepsis due to cellulitis [facility]) Active Problems:   AKI (acute kidney injury) (HCC)   Hyponatremia    enoxaparin (LOVENOX) injection  40 mg Subcutaneous Q24H    HPI: Dean Nichols is a 27 y.o. male otherwise healthy admitted for malaise, fatigue,   Was  helping his grandmother Monday at home outside build a fence around a pond in backyard and got a cut on the right knee. Tuesday 10/1 he was not feeling well and left work early. Wednesday he continued to feel poorly and sought care at urgent care - given lymphangitic spread up more proximal thigh he was recommended to go to ER for care.  Blood cultures were collected and growing  He had some vomiting and malaise at home PTA but not vomiting currently and holding some fluids down.   Works at [employer].  Also has some abrasions on Right lower anterior shin that he says are more chronic and non painful.  Marijuana use recreationally. No other drug use and infrequent alcohol use.     Review of Systems: Review of Systems  Constitutional:  Positive for chills, fever and malaise/fatigue.  Gastrointestinal:  Positive for nausea and vomiting.  Musculoskeletal:  Positive for joint pain.  Skin:  Positive for rash.    Past Medical History:  Diagnosis Date   ACL tear    right    Social History   Tobacco Use   Smoking status: Never   Smokeless tobacco: Never  Vaping Use   Vaping status: Never Used  Substance Use Topics   Alcohol use: Not Currently   Drug use: Yes    Types: Marijuana    History reviewed. No pertinent family history. No Known Allergies  OBJECTIVE: Blood pressure (!) 106/52, pulse (!) 101, temperature 99 F (37.2 C), temperature source Oral, resp. rate 18, height 6' (1.829 m), weight 75.1 kg, SpO2 100%.  Physical Exam Constitutional:      Appearance: Normal appearance. He is not ill-appearing or toxic-appearing.  Cardiovascular:     Rate and Rhythm: Regular rhythm. Tachycardia present.  Pulmonary:     Effort: Pulmonary effort is normal.  Abdominal:     General: Bowel sounds are normal. There is no distension.  Musculoskeletal:        General: Swelling present.  Skin:    General: Skin is warm.     Capillary Refill: Capillary refill takes less than 2 seconds.      Findings: Rash present.  Neurological:     Mental Status: He is alert and oriented to person, place, and time.     Lab Results Lab Results  Component Value Date   WBC 11.9 (H) 10/19/2022   HGB 12.4 (L) 10/19/2022   HCT 34.6 (L) 10/19/2022   MCV 85.4 10/19/2022   PLT 155 10/19/2022    Lab Results  Component Value Date   CREATININE 1.47 (H) 10/19/2022   BUN 17 10/19/2022   NA 134 (L) 10/19/2022   K 3.7 10/19/2022   CL 98 10/19/2022   CO2 25 10/19/2022    Lab Results  Component Value Date   ALT 36 10/19/2022   AST 48 (H) 10/19/2022   ALKPHOS 54 10/19/2022   BILITOT 1.1 10/19/2022     Microbiology: Recent Results (from the past 240 hour(s))  Culture, blood (Routine x 2)     Status: None (Preliminary result)   Collection Time: 10/18/22  3:26 PM   Specimen: BLOOD LEFT ARM  Result Value Ref Range Status   Specimen Description   Final    BLOOD LEFT ARM Performed at [facility], [address] [place], [place] [address]    Special Requests   Final    BOTTLES DRAWN AEROBIC AND ANAEROBIC Blood Culture adequate volume Performed at [facility], [address], [place], [place] [address]    Culture  Setup Time   Final    GRAM POSITIVE COCCI IN CHAINS ANAEROBIC BOTTLE ONLY CRITICAL RESULT CALLED TO, READ BACK BY AND VERIFIED WITH: PHARMD BAILEY A 1153 FCP Performed at [facility], [address] [place], [place] [address]    Culture GRAM POSITIVE COCCI  Final   Report Status PENDING  Incomplete  Blood Culture ID Panel (Reflexed)     Status: Abnormal   Collection Time: 10/18/22  3:26 PM  Result Value Ref Range Status   Enterococcus faecalis NOT DETECTED NOT DETECTED Final   Enterococcus Faecium NOT DETECTED NOT DETECTED Final   Listeria monocytogenes NOT DETECTED NOT DETECTED Final   Staphylococcus species NOT DETECTED NOT DETECTED Final   Staphylococcus aureus (BCID) NOT DETECTED NOT DETECTED Final   Staphylococcus epidermidis NOT  DETECTED NOT DETECTED Final   Staphylococcus lugdunensis NOT DETECTED NOT DETECTED Final   Streptococcus species DETECTED (A) NOT DETECTED Final    Comment: CRITICAL RESULT CALLED TO, READ BACK BY AND VERIFIED WITH: PHARMD BAILEY A 1153 FCP    Streptococcus agalactiae NOT DETECTED NOT DETECTED Final   Streptococcus pneumoniae NOT DETECTED NOT DETECTED Final   Streptococcus pyogenes DETECTED (  A) NOT DETECTED Final    Comment: CRITICAL RESULT CALLED TO, READ BACK BY AND VERIFIED WITH: PHARMD BAILEY A 1153 FCP    A.calcoaceticus-baumannii NOT DETECTED NOT DETECTED Final   Bacteroides fragilis NOT DETECTED NOT DETECTED Final   Enterobacterales NOT DETECTED NOT DETECTED Final   Enterobacter cloacae complex NOT DETECTED NOT DETECTED Final   Escherichia coli NOT DETECTED NOT DETECTED Final   Klebsiella aerogenes NOT DETECTED NOT DETECTED Final   Klebsiella oxytoca NOT DETECTED NOT DETECTED Final   Klebsiella pneumoniae NOT DETECTED NOT DETECTED Final   Proteus species NOT DETECTED NOT DETECTED Final   Salmonella species NOT DETECTED NOT DETECTED Final   Serratia marcescens NOT DETECTED NOT DETECTED Final   Haemophilus influenzae NOT DETECTED NOT DETECTED Final   Neisseria meningitidis NOT DETECTED NOT DETECTED Final   Pseudomonas aeruginosa NOT DETECTED NOT DETECTED Final   Stenotrophomonas maltophilia NOT DETECTED NOT DETECTED Final   Candida albicans NOT DETECTED NOT DETECTED Final   Candida auris NOT DETECTED NOT DETECTED Final   Candida glabrata NOT DETECTED NOT DETECTED Final   Candida krusei NOT DETECTED NOT DETECTED Final   Candida parapsilosis NOT DETECTED NOT DETECTED Final   Candida tropicalis NOT DETECTED NOT DETECTED Final   Cryptococcus neoformans/gattii NOT DETECTED NOT DETECTED Final    Comment: Performed at [facility], [address] [place], [place] [address]  Culture, blood (Routine x 2)     Status: None (Preliminary result)   Collection Time: 10/18/22  3:31 PM    Specimen: BLOOD  Result Value Ref Range Status   Specimen Description   Final    BLOOD LEFT ANTECUBITAL Performed at [facility], [address] [place], [place] [address]    Special Requests   Final    BOTTLES DRAWN AEROBIC AND ANAEROBIC Blood Culture adequate volume Performed at [facility], [address], [place], [place] [address]    Culture   Final    NO GROWTH < 24 HOURS Performed at [facility], [address], [place], [place] [address]    Report Status PENDING  Incomplete     Rexene Alberts, MSN, NP-C [facility]  [email or web address] Pager: [phone] Office: [phone] [facility] Main Line: [phone] *Secure Chat Communication Welcome

## 2022-10-19 NOTE — Plan of Care (Signed)

## 2022-10-19 NOTE — Progress Notes (Signed)
Echocardiogram 2D Echocardiogram has been performed.  Dean Nichols 10/19/2022, 5:59 PM

## 2022-10-19 NOTE — Progress Notes (Addendum)
PHARMACY - PHYSICIAN COMMUNICATION CRITICAL VALUE ALERT - BLOOD CULTURE IDENTIFICATION (BCID)  Dean Nichols is an 27 y.o. male who presented to [facility] on 10/18/2022 with a chief complaint of skin and soft tissue infection  Assessment:  Group A Strep Bacteremia, likely SSTI source  Name of physician (or Provider) Contacted: Dr. Lacretia Nicks  Current antibiotics: Vancomycin + Cefepime  Changes to prescribed antibiotics recommended: IV Penicillin G + Linezolid Recommendations accepted by provider  Results for orders placed or performed during the hospital encounter of 10/18/22  Blood Culture ID Panel (Reflexed) (Collected: 10/18/2022  3:26 PM)  Result Value Ref Range   Enterococcus faecalis NOT DETECTED NOT DETECTED   Enterococcus Faecium NOT DETECTED NOT DETECTED   Listeria monocytogenes NOT DETECTED NOT DETECTED   Staphylococcus species NOT DETECTED NOT DETECTED   Staphylococcus aureus (BCID) NOT DETECTED NOT DETECTED   Staphylococcus epidermidis NOT DETECTED NOT DETECTED   Staphylococcus lugdunensis NOT DETECTED NOT DETECTED   Streptococcus species DETECTED (A) NOT DETECTED   Streptococcus agalactiae NOT DETECTED NOT DETECTED   Streptococcus pneumoniae NOT DETECTED NOT DETECTED   Streptococcus pyogenes DETECTED (A) NOT DETECTED   A.calcoaceticus-baumannii NOT DETECTED NOT DETECTED   Bacteroides fragilis NOT DETECTED NOT DETECTED   Enterobacterales NOT DETECTED NOT DETECTED   Enterobacter cloacae complex NOT DETECTED NOT DETECTED   Escherichia coli NOT DETECTED NOT DETECTED   Klebsiella aerogenes NOT DETECTED NOT DETECTED   Klebsiella oxytoca NOT DETECTED NOT DETECTED   Klebsiella pneumoniae NOT DETECTED NOT DETECTED   Proteus species NOT DETECTED NOT DETECTED   Salmonella species NOT DETECTED NOT DETECTED   Serratia marcescens NOT DETECTED NOT DETECTED   Haemophilus influenzae NOT DETECTED NOT DETECTED   Neisseria meningitidis NOT DETECTED NOT DETECTED   Pseudomonas  aeruginosa NOT DETECTED NOT DETECTED   Stenotrophomonas maltophilia NOT DETECTED NOT DETECTED   Candida albicans NOT DETECTED NOT DETECTED   Candida auris NOT DETECTED NOT DETECTED   Candida glabrata NOT DETECTED NOT DETECTED   Candida krusei NOT DETECTED NOT DETECTED   Candida parapsilosis NOT DETECTED NOT DETECTED   Candida tropicalis NOT DETECTED NOT DETECTED   Cryptococcus neoformans/gattii NOT DETECTED NOT DETECTED     [name] 10/19/2022  12:54 PM

## 2022-10-19 NOTE — Consult Note (Signed)
Reason for Consult:Right leg cellulitis Referring Physician: Lacretia Nicks Time called: 1159 Time at bedside: 1338   Dean Nichols is an 27 y.o. male.  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 He treated it at home but his leg became red and swollen and he went to [facility] yesterday who directed him come to the ED. He was admitted and orthopedic surgery was consulted the following day to r/o septic joint. He had fevers, chills, sweats, and N/V early on but all of that has resolved. He c/o significant pain with WB that is the entire leg. He denies prior hx/o similar.  Past Medical History:  Diagnosis Date   ACL tear    right    History reviewed. No pertinent surgical history.  History reviewed. No pertinent family history.  Social History:  reports that he has never smoked. He has never used smokeless tobacco. He reports that he does not currently use alcohol. He reports current drug use. Drug: Marijuana.  Allergies: No Known Allergies  Medications: I have reviewed the patient's current medications.  Results for orders placed or performed during the hospital encounter of 10/18/22 (from the past 48 hour(s))  Culture, blood (Routine x 2)     Status: None (Preliminary result)   Collection Time: 10/18/22  3:26 PM   Specimen: BLOOD LEFT ARM  Result Value Ref Range   Specimen Description      BLOOD LEFT ARM Performed at [facility], [address] [place], [place] [address]    Special Requests      BOTTLES DRAWN AEROBIC AND ANAEROBIC Blood Culture adequate volume Performed at [facility], [address], [place], [place] [address]    Culture  Setup Time      GRAM POSITIVE COCCI IN CHAINS ANAEROBIC BOTTLE ONLY CRITICAL RESULT CALLED TO, READ BACK BY AND VERIFIED WITH: PHARMD BAILEY A 1153 FCP Performed at [facility], [address], [place], [place] [address]    Culture GRAM POSITIVE COCCI    Report  Status PENDING   Blood Culture ID Panel (Reflexed)     Status: Abnormal   Collection Time: 10/18/22  3:26 PM  Result Value Ref Range   Enterococcus faecalis NOT DETECTED NOT DETECTED   Enterococcus Faecium NOT DETECTED NOT DETECTED   Listeria monocytogenes NOT DETECTED NOT DETECTED   Staphylococcus species NOT DETECTED NOT DETECTED   Staphylococcus aureus (BCID) NOT DETECTED NOT DETECTED   Staphylococcus epidermidis NOT DETECTED NOT DETECTED   Staphylococcus lugdunensis NOT DETECTED NOT DETECTED   Streptococcus species DETECTED (A) NOT DETECTED    Comment: CRITICAL RESULT CALLED TO, READ BACK BY AND VERIFIED WITH: PHARMD BAILEY A 1153 FCP    Streptococcus agalactiae NOT DETECTED NOT DETECTED   Streptococcus pneumoniae NOT DETECTED NOT DETECTED   Streptococcus pyogenes DETECTED (A) NOT DETECTED    Comment: CRITICAL RESULT CALLED TO, READ BACK BY AND VERIFIED WITH: PHARMD BAILEY A 1153 FCP    A.calcoaceticus-baumannii NOT DETECTED NOT DETECTED   Bacteroides fragilis NOT DETECTED NOT DETECTED   Enterobacterales NOT DETECTED NOT DETECTED   Enterobacter cloacae complex NOT DETECTED NOT DETECTED   Escherichia coli NOT DETECTED NOT DETECTED   Klebsiella aerogenes NOT DETECTED NOT DETECTED   Klebsiella oxytoca NOT DETECTED NOT DETECTED   Klebsiella pneumoniae NOT DETECTED NOT DETECTED   Proteus species NOT DETECTED NOT DETECTED   Salmonella species NOT DETECTED NOT DETECTED   Serratia marcescens NOT DETECTED  NOT DETECTED   Haemophilus influenzae NOT DETECTED NOT DETECTED   Neisseria meningitidis NOT DETECTED NOT DETECTED   Pseudomonas aeruginosa NOT DETECTED NOT DETECTED   Stenotrophomonas maltophilia NOT DETECTED NOT DETECTED   Candida albicans NOT DETECTED NOT DETECTED   Candida auris NOT DETECTED NOT DETECTED   Candida glabrata NOT DETECTED NOT DETECTED   Candida krusei NOT DETECTED NOT DETECTED   Candida parapsilosis NOT DETECTED NOT DETECTED   Candida tropicalis NOT DETECTED NOT  DETECTED   Cryptococcus neoformans/gattii NOT DETECTED NOT DETECTED    Comment: Performed at [facility], [address] [place], [place] [address]  Comprehensive metabolic panel     Status: Abnormal   Collection Time: 10/18/22  3:27 PM  Result Value Ref Range   Sodium 131 (L) 135 - 145 mmol/L   Potassium 3.7 3.5 - 5.1 mmol/L   Chloride 93 (L) 98 - 111 mmol/L   CO2 25 22 - 32 mmol/L   Glucose, Bld 113 (H) 70 - 99 mg/dL    Comment: Glucose reference range applies only to samples taken after fasting for at least 8 hours.   BUN 25 (H) 6 - 20 mg/dL   Creatinine, Ser 6.04 (H) 0.61 - 1.24 mg/dL   Calcium 9.5 8.9 - 54.0 mg/dL   Total Protein 8.5 (H) 6.5 - 8.1 g/dL   Albumin 4.6 3.5 - 5.0 g/dL   AST 21 15 - 41 U/L   ALT 19 0 - 44 U/L   Alkaline Phosphatase 70 38 - 126 U/L   Total Bilirubin 1.2 0.3 - 1.2 mg/dL   GFR, Estimated >98 >11 mL/min    Comment: (NOTE) Calculated using the CKD-EPI Creatinine Equation (2021)    Anion gap 13 5 - 15    Comment: Performed at [facility], [address], [place], [place] [address]  Lactic acid, plasma     Status: Abnormal   Collection Time: 10/18/22  3:27 PM  Result Value Ref Range   Lactic Acid, Venous 3.1 (HH) 0.5 - 1.9 mmol/L    Comment: CRITICAL RESULT CALLED TO, READ BACK BY AND VERIFIED WITH: ANGELA BARLOW RN,10/18/22 @ 1615 BY JYANG Performed at [facility], [address] [place], [place], [place] [address]   CBC with Differential     Status: Abnormal   Collection Time: 10/18/22  3:27 PM  Result Value Ref Range   WBC 17.9 (H) 4.0 - 10.5 K/uL   RBC 5.20 4.22 - 5.81 MIL/uL   Hemoglobin 16.2 13.0 - 17.0 g/dL   HCT 13.0 86.5 - 78.4 %   MCV 86.3 80.0 - 100.0 fL   MCH 31.2 26.0 - 34.0 pg   MCHC 36.1 (H) 30.0 - 36.0 g/dL   RDW 69.6 29.5 - 28.4 %   Platelets 227 150 - 400 K/uL   nRBC 0.0 0.0 - 0.2 %   Neutrophils Relative % 71 %   Neutro Abs 16.3 (H) 1.7 - 7.7 K/uL   Band Neutrophils 20 %    Lymphocytes Relative 0 %   Lymphs Abs 0.0 (L) 0.7 - 4.0 K/uL   Monocytes Relative 4 %   Monocytes Absolute 0.7 0.1 - 1.0 K/uL   Eosinophils Relative 0 %   Eosinophils Absolute 0.0 0.0 - 0.5 K/uL   Basophils Relative 0 %   Basophils Absolute 0.0 0.0 - 0.1 K/uL   WBC Morphology INCREASED BANDS (>20% BANDS)     Comment: Mild Left Shift (1-5% metas, occ myelo) VACUOLATED NEUTROPHILS  RBC Morphology NO SCHISTOCYTES SEEN    Metamyelocytes Relative 4 %   Myelocytes 1 %   Abs Immature Granulocytes 0.90 (H) 0.00 - 0.07 K/uL    Comment: Performed at [facility], [address] [place], [place] [address]  Protime-INR     Status: Abnormal   Collection Time: 10/18/22  3:27 PM  Result Value Ref Range   Prothrombin Time 17.0 (H) 11.4 - 15.2 seconds   INR 1.4 (H) 0.8 - 1.2    Comment: (NOTE) INR goal varies based on device and disease states. Performed at [facility], [address], [place], [place] [address]   Culture, blood (Routine x 2)     Status: None (Preliminary result)   Collection Time: 10/18/22  3:31 PM   Specimen: BLOOD  Result Value Ref Range   Specimen Description      BLOOD LEFT ANTECUBITAL Performed at [facility], [address], [place], [place] [address]    Special Requests      BOTTLES DRAWN AEROBIC AND ANAEROBIC Blood Culture adequate volume Performed at [facility], [address] [place], [place] [address]    Culture      NO GROWTH < 24 HOURS Performed at [facility], [address] [place], [place] [address]    Report Status PENDING   Urinalysis, w/ Reflex to Culture (Infection Suspected) -Urine, Clean Catch     Status: Abnormal   Collection Time: 10/18/22  3:34 PM  Result Value Ref Range   Specimen Source URINE, CATHETERIZED    Color, Urine YELLOW YELLOW   APPearance CLEAR CLEAR   Specific Gravity, Urine 1.028 1.005 - 1.030   pH 6.5 5.0 - 8.0   Glucose, UA  NEGATIVE NEGATIVE mg/dL   Hgb urine dipstick TRACE (A) NEGATIVE   Bilirubin Urine NEGATIVE NEGATIVE   Ketones, ur TRACE (A) NEGATIVE mg/dL   Protein, ur 30 (A) NEGATIVE mg/dL   Nitrite NEGATIVE NEGATIVE   Leukocytes,Ua TRACE (A) NEGATIVE   RBC / HPF 0-5 0 - 5 RBC/hpf   WBC, UA 0-5 0 - 5 WBC/hpf    Comment:        Reflex urine culture not performed if WBC <=10, OR if Squamous epithelial cells >5. If Squamous epithelial cells >5 suggest recollection.    Bacteria, UA NONE SEEN NONE SEEN   Squamous Epithelial / HPF 0-5 0 - 5 /HPF   Trans Epithel, UA <1     Comment: Performed at [facility], [address] [place], [place] [address]  C-reactive protein     Status: Abnormal   Collection Time: 10/18/22  4:15 PM  Result Value Ref Range   CRP 34.2 (H) <1.0 mg/dL    Comment: Performed at [facility], [address], [place], [place] [address]  Sedimentation rate     Status: Abnormal   Collection Time: 10/18/22  4:25 PM  Result Value Ref Range   Sed Rate 26 (H) 0 - 16 mm/hr    Comment: Performed at [facility], [address], [place], [place] [address]  Lactic acid, plasma     Status: Abnormal   Collection Time: 10/18/22  5:39 PM  Result Value Ref Range   Lactic Acid, Venous 2.0 (HH) 0.5 - 1.9 mmol/L    Comment: CRITICAL VALUE NOTED.  VALUE IS CONSISTENT WITH PREVIOUSLY REPORTED AND CALLED VALUE. Performed at [facility], [address] [place], [place] [address]   HIV Antibody (routine testing  w rflx)     Status: None   Collection Time: 10/18/22  8:50 PM  Result Value Ref Range   HIV Screen 4th Generation wRfx Non Reactive Non Reactive    Comment: Performed at [facility], [address] [place], [place] [address]  CBC     Status: Abnormal   Collection Time: 10/18/22  8:50 PM  Result Value Ref Range   WBC 13.9 (H) 4.0 - 10.5 K/uL   RBC 4.40 4.22 - 5.81 MIL/uL   Hemoglobin 13.8 13.0 - 17.0 g/dL   HCT  62.1 (L) 30.8 - 52.0 %   MCV 86.1 80.0 - 100.0 fL   MCH 31.4 26.0 - 34.0 pg   MCHC 36.4 (H) 30.0 - 36.0 g/dL   RDW 65.7 84.6 - 96.2 %   Platelets 174 150 - 400 K/uL   nRBC 0.0 0.0 - 0.2 %    Comment: Performed at [facility], [address], [place], [place] [address]  Creatinine, serum     Status: Abnormal   Collection Time: 10/18/22  8:50 PM  Result Value Ref Range   Creatinine, Ser 1.43 (H) 0.61 - 1.24 mg/dL   GFR, Estimated >13 >24 mL/min    Comment: (NOTE) Calculated using the CKD-EPI Creatinine Equation (2021) Performed at [facility], [address], [place], [place] [address]   Protime-INR     Status: Abnormal   Collection Time: 10/19/22  6:33 AM  Result Value Ref Range   Prothrombin Time 18.8 (H) 11.4 - 15.2 seconds   INR 1.5 (H) 0.8 - 1.2    Comment: (NOTE) INR goal varies based on device and disease states. Performed at [facility], [address] [place], [place] [address]   Cortisol-am, blood     Status: Abnormal   Collection Time: 10/19/22  6:33 AM  Result Value Ref Range   Cortisol - AM 26.0 (H) 6.7 - 22.6 ug/dL    Comment: Performed at [facility], [address] [place], [place] [address]  Procalcitonin     Status: None   Collection Time: 10/19/22  6:33 AM  Result Value Ref Range   Procalcitonin 45.04 ng/mL    Comment:        Interpretation: PCT >= 10 ng/mL: Important systemic inflammatory response, almost exclusively due to severe bacterial sepsis or septic shock. (NOTE)       Sepsis PCT Algorithm           Lower Respiratory Tract                                      Infection PCT Algorithm    ----------------------------     ----------------------------         PCT < 0.25 ng/mL                PCT < 0.10 ng/mL          Strongly encourage             Strongly discourage   discontinuation of antibiotics    initiation of antibiotics    ----------------------------     -----------------------------       PCT 0.25 - 0.50  ng/mL            PCT 0.10 - 0.25 ng/mL               OR       >  80% decrease in PCT            Discourage initiation of                                            antibiotics      Encourage discontinuation           of antibiotics    ----------------------------     -----------------------------         PCT >= 0.50 ng/mL              PCT 0.26 - 0.50 ng/mL                AND       <80% decrease in PCT             Encourage initiation of                                             antibiotics       Encourage continuation           of antibiotics    ----------------------------     -----------------------------        PCT >= 0.50 ng/mL                  PCT > 0.50 ng/mL               AND         increase in PCT                  Strongly encourage                                      initiation of antibiotics    Strongly encourage escalation           of antibiotics                                     -----------------------------                                           PCT <= 0.25 ng/mL                                                 OR                                        > 80% decrease in PCT                                      Discontinue / Do not initiate  antibiotics  Performed at [facility], [address] [place], [place] [address]   CBC     Status: Abnormal   Collection Time: 10/19/22  6:33 AM  Result Value Ref Range   WBC 11.9 (H) 4.0 - 10.5 K/uL   RBC 4.05 (L) 4.22 - 5.81 MIL/uL   Hemoglobin 12.4 (L) 13.0 - 17.0 g/dL   HCT 60.4 (L) 54.0 - 98.1 %   MCV 85.4 80.0 - 100.0 fL   MCH 30.6 26.0 - 34.0 pg   MCHC 35.8 30.0 - 36.0 g/dL   RDW 19.1 47.8 - 29.5 %   Platelets 155 150 - 400 K/uL   nRBC 0.0 0.0 - 0.2 %    Comment: Performed at [facility], [address], [place], [place] [address]  Comprehensive metabolic panel     Status: Abnormal   Collection Time: 10/19/22  6:33 AM  Result Value Ref Range    Sodium 134 (L) 135 - 145 mmol/L   Potassium 3.7 3.5 - 5.1 mmol/L   Chloride 98 98 - 111 mmol/L   CO2 25 22 - 32 mmol/L   Glucose, Bld 113 (H) 70 - 99 mg/dL    Comment: Glucose reference range applies only to samples taken after fasting for at least 8 hours.   BUN 17 6 - 20 mg/dL   Creatinine, Ser 8.65 (H) 0.61 - 1.24 mg/dL   Calcium 8.0 (L) 8.9 - 10.3 mg/dL   Total Protein 5.4 (L) 6.5 - 8.1 g/dL   Albumin 2.5 (L) 3.5 - 5.0 g/dL   AST 48 (H) 15 - 41 U/L   ALT 36 0 - 44 U/L   Alkaline Phosphatase 54 38 - 126 U/L   Total Bilirubin 1.1 0.3 - 1.2 mg/dL   GFR, Estimated >78 >46 mL/min    Comment: (NOTE) Calculated using the CKD-EPI Creatinine Equation (2021)    Anion gap 11 5 - 15    Comment: Performed at [facility], [address] [place], [place] [address]    US Venous Img Lower Right (DVT Study)  Result Date: 10/18/2022 CLINICAL DATA:  Right knee cellulitis, now with pain and edema. Evaluate for DVT. EXAM: RIGHT LOWER EXTREMITY VENOUS DOPPLER ULTRASOUND TECHNIQUE: Gray-scale sonography with graded compression, as well as color Doppler and duplex ultrasound were performed to evaluate the lower extremity deep venous systems from the level of the common femoral vein and including the common femoral, femoral, profunda femoral, popliteal and calf veins including the posterior tibial, peroneal and gastrocnemius veins when visible. The superficial great saphenous vein was also interrogated. Spectral Doppler was utilized to evaluate flow at rest and with distal augmentation maneuvers in the common femoral, femoral and popliteal veins. COMPARISON:  None Available. FINDINGS: Common Femoral Vein: No evidence of thrombus. Normal compressibility, respiratory phasicity and response to augmentation. Saphenofemoral Junction: No evidence of thrombus. Normal compressibility and flow on color Doppler imaging. Profunda Femoral Vein: No evidence of thrombus. Normal compressibility and flow on color Doppler  imaging. Femoral Vein: No evidence of thrombus. Normal compressibility, respiratory phasicity and response to augmentation. Popliteal Vein: No evidence of thrombus. Normal compressibility, respiratory phasicity and response to augmentation. Calf Veins: No evidence of thrombus. Normal compressibility and flow on color Doppler imaging. Superficial Great Saphenous Vein: No evidence of thrombus. Normal compressibility. Other Findings: Note is made of a mildly prominent though non pathologically enlarged and benign-appearing right inguinal lymph node. The lymph node is not enlarged by size criteria measuring  1 cm in greatest short axis diameter and maintains a benign fatty hilum (image 8), presumably reactive in etiology. IMPRESSION: No evidence of DVT within the right lower extremity. Electronically Signed   By: Simonne Come M.D.   On: 10/18/2022 16:46   DG Chest 2 View  Result Date: 10/18/2022 CLINICAL DATA:  Extremity cellulitis, infection, concern for sepsis EXAM: CHEST - 2 VIEW COMPARISON:  None Available. FINDINGS: The heart size and mediastinal contours are within normal limits. Both lungs are clear. The visualized skeletal structures are unremarkable. IMPRESSION: No active cardiopulmonary disease. Electronically Signed   By: Judie Petit.  Shick M.D.   On: 10/18/2022 16:19   DG Knee Complete 4 Views Right  Result Date: 10/18/2022 CLINICAL DATA:  Right knee pain, soft tissue swelling, concern for cellulitis EXAM: RIGHT KNEE - COMPLETE 4+ VIEW COMPARISON:  03/13/2020 FINDINGS: No evidence of fracture, dislocation, or joint effusion. No evidence of arthropathy or other focal bone abnormality. Diffuse soft tissue swelling noted more pronounced laterally. IMPRESSION: Soft tissue swelling. No acute osseous finding. Electronically Signed   By: Judie Petit.  Shick M.D.   On: 10/18/2022 16:18    Review of Systems  HENT:  Negative for ear discharge, ear pain, hearing loss and tinnitus.   Eyes:  Negative for photophobia and pain.   Respiratory:  Negative for cough and shortness of breath.   Cardiovascular:  Negative for chest pain.  Gastrointestinal:  Negative for abdominal pain, nausea and vomiting.  Genitourinary:  Negative for dysuria, flank pain, frequency and urgency.  Musculoskeletal:  Positive for arthralgias (RLE). Negative for back pain, myalgias and neck pain.  Neurological:  Negative for dizziness and headaches.  Hematological:  Does not bruise/bleed easily.  Psychiatric/Behavioral:  The patient is not nervous/anxious.    Blood pressure (!) 101/49, pulse (!) 112, temperature (!) 100.5 F (38.1 C), resp. rate 18, height 6' (1.829 m), weight 75.1 kg, SpO2 100%. Physical Exam Constitutional:      General: He is not in acute distress.    Appearance: He is well-developed. He is not diaphoretic.  HENT:     Head: Normocephalic and atraumatic.  Eyes:     General: No scleral icterus.       Right eye: No discharge.        Left eye: No discharge.     Conjunctiva/sclera: Conjunctivae normal.  Cardiovascular:     Rate and Rhythm: Normal rate and regular rhythm.  Pulmonary:     Effort: Pulmonary effort is normal. No respiratory distress.  Musculoskeletal:     Cervical back: Normal range of motion.     Comments: RLE Ant lower leg excoriations, 2cm lac anterolateral inferior knee, generalized erythema and edema, mild TTP  Full AROM with minimal pain  No knee or ankle effusion  Knee stable to varus/ valgus and anterior/posterior stress  Sens DPN, SPN, TN intact  Motor EHL, ext, flex, evers 5/5  DP 2+, PT 2+, No significant edema  Skin:    General: Skin is warm and dry.  Neurological:     Mental Status: He is alert.  Psychiatric:        Mood and Affect: Mood normal.        Behavior: Behavior normal.     Assessment/Plan: RLE cellulitis -- I find no e/o knee joint involvement. Do not think arthrocentesis would be diagnostic or therapeutic.As long as he continues to improve with treatment of his cellulitis  he does not need orthopedic f/u.    Freeman Caldron, PA-C Orthopedic  Surgery [phone] 10/19/2022, 2:40 PM

## 2022-10-19 NOTE — Progress Notes (Addendum)
PROGRESS NOTE    Dean Nichols  ZOX:[id number] DOB: 1995/10/01 DOA: 10/18/2022 PCP: Pcp, No  Chief Complaint  Patient presents with   Knee Pain    Brief Narrative:   Dean Nichols is Dean Nichols 27 y.o. 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  He was working in the yard when he accidentally cut his leg around the lateral aspect of the right knee with Dean Nichols metal pipe.  He presented to the ED from urgent care with fevers, chills, nausea, vomiting -> sepsis -> was admitted for sepsis due to right lower extremity cellulitis.  Assessment & Plan:   Principal Problem:   Sepsis due to cellulitis [facility]) Active Problems:   AKI (acute kidney injury) (HCC)   Hyponatremia  Sepsis due to Strep pyogenes Bacteremia Concern for Septic Arthritis  Cellulitis of Right Lower Extremity  Met criteria for sepsis on presentation, continues to meet criteria for sepsis with tachycardia, fever, leukocytosis Blood cultures with gram positive cocci (BCID with strep pyogenes) Concern for septic arthritis -> orthopedics consulted (PA messaged on secure chat) and MRI R knee pending Procalcitonin 45 CRP 34.2 Abx adjusted to pcn and linezolid after discussion with pharmacy Will consult infectious disease Repeat cultures 10/4 AM Echocardiogram  S/p Tdap 10/2 in ED  Acute kidney Injury  Baseline creatinine unknown Suspect due to sepsis Follow renal US, UA unrevealing Continue IVF    DVT prophylaxis: lovenox Code Status: full Family Communication: friend at bedside Disposition:   Status is: Inpatient Remains inpatient appropriate because: need for continued inpatient care   Consultants:  orthopedics  Procedures:  none  Antimicrobials:  Anti-infectives (From admission, onward)    Start     Dose/Rate Route Frequency Ordered Stop   10/19/22 1300  penicillin G potassium 12  Million Units in dextrose 5 % 500 mL CONTINUOUS infusion        12 Million Units 41.7 mL/hr over 12 Hours Intravenous Every 12 hours 10/19/22 1213     10/19/22 1300  linezolid (ZYVOX) IVPB 600 mg        600 mg 300 mL/hr over 60 Minutes Intravenous Every 12 hours 10/19/22 1213     10/19/22 0600  vancomycin (VANCOCIN) IVPB 1000 mg/200 mL premix  Status:  Discontinued        1,000 mg 200 mL/hr over 60 Minutes Intravenous Every 12 hours 10/18/22 1734 10/19/22 1213   10/19/22 0000  ceFEPIme (MAXIPIME) 2 g in sodium chloride 0.9 % 100 mL IVPB  Status:  Discontinued        2 g 200 mL/hr over 30 Minutes Intravenous Every 8 hours 10/18/22 1734 10/19/22 1213   10/18/22 2130  vancomycin (VANCOCIN) IVPB 1000 mg/200 mL premix  Status:  Discontinued        1,000 mg 200 mL/hr over 60 Minutes Intravenous  Once 10/18/22 2033 10/18/22 2047   10/18/22 1630  ceFEPIme (MAXIPIME) 2 g in sodium chloride 0.9 % 100 mL IVPB        2 g 200 mL/hr over 30 Minutes Intravenous  Once 10/18/22 1616 10/18/22 1820   10/18/22 1630  metroNIDAZOLE (FLAGYL) IVPB 500 mg        500 mg 100 mL/hr over 60 Minutes Intravenous  Once 10/18/22 1616 10/18/22 1819   10/18/22 1630  vancomycin (VANCOCIN) IVPB 1000 mg/200 mL premix        1,000 mg 200 mL/hr  over 60 Minutes Intravenous  Once 10/18/22 1616 10/18/22 1924       Subjective: C/o knee pain, unchanged  Objective: Vitals:   10/19/22 0150 10/19/22 0243 10/19/22 0350 10/19/22 0756  BP: (!) 82/36 (!) 94/55 99/63 (!) 101/49  Pulse: 81 83 (!) 102 (!) 112  Resp:   18   Temp:    (!) 100.5 F (38.1 C)  TempSrc:      SpO2: 99% 100% 100% 100%  Weight:   75.1 kg   Height:        Intake/Output Summary (Last 24 hours) at 10/19/2022 1349 Last data filed at 10/19/2022 0300 Gross per 24 hour  Intake 7069 ml  Output --  Net 7069 ml   Filed Weights   10/18/22 1518 10/19/22 0350  Weight: 74.8 kg 75.1 kg    Examination:  General exam: Appears calm and comfortable   Respiratory system: unlabored Cardiovascular system: S1 & S2 heard, RRR. Central nervous system: Alert and oriented. No focal neurological deficits. Extremities: RLE swollen and erythematous, most around the knee.  Relatively good ROM to R knee, but limited when compared to left and with significant pain with range.      Data Reviewed: I have personally reviewed following labs and imaging studies  CBC: Recent Labs  Lab 10/18/22 1527 10/18/22 2050 10/19/22 0633  WBC 17.9* 13.9* 11.9*  NEUTROABS 16.3*  --   --   HGB 16.2 13.8 12.4*  HCT 44.9 37.9* 34.6*  MCV 86.3 86.1 85.4  PLT 227 174 155    Basic Metabolic Panel: Recent Labs  Lab 10/18/22 1527 10/18/22 2050 10/19/22 0633  NA 131*  --  134*  K 3.7  --  3.7  CL 93*  --  98  CO2 25  --  25  GLUCOSE 113*  --  113*  BUN 25*  --  17  CREATININE 1.50* 1.43* 1.47*  CALCIUM 9.5  --  8.0*    GFR: Estimated Creatinine Clearance: 79.5 mL/min (Dean Nichols) (by C-G formula based on SCr of 1.47 mg/dL (H)).  Liver Function Tests: Recent Labs  Lab 10/18/22 1527 10/19/22 0633  AST 21 48*  ALT 19 36  ALKPHOS 70 54  BILITOT 1.2 1.1  PROT 8.5* 5.4*  ALBUMIN 4.6 2.5*    CBG: No results for input(s): "GLUCAP" in the last 168 hours.   Recent Results (from the past 240 hour(s))  Culture, blood (Routine x 2)     Status: None (Preliminary result)   Collection Time: 10/18/22  3:26 PM   Specimen: BLOOD LEFT ARM  Result Value Ref Range Status   Specimen Description   Final    BLOOD LEFT ARM Performed at [facility], [address] [place], [place] [address]    Special Requests   Final    BOTTLES DRAWN AEROBIC AND ANAEROBIC Blood Culture adequate volume Performed at [facility], [address], [place], [place] [address]    Culture  Setup Time   Final    GRAM POSITIVE COCCI IN CHAINS ANAEROBIC BOTTLE ONLY CRITICAL RESULT CALLED TO, READ BACK BY AND VERIFIED WITH: PHARMD Dean Nichols 1153 FCP Performed at  [facility], [address], [place], [place] [address]    Culture [facility] POSITIVE COCCI  Final   Report Status PENDING  Incomplete  Blood Culture ID Panel (Reflexed)     Status: Abnormal   Collection Time: 10/18/22  3:26 PM  Result Value Ref Range Status   Enterococcus faecalis  NOT DETECTED NOT DETECTED Final   Enterococcus Faecium NOT DETECTED NOT DETECTED Final   Listeria monocytogenes NOT DETECTED NOT DETECTED Final   Staphylococcus species NOT DETECTED NOT DETECTED Final   Staphylococcus aureus (BCID) NOT DETECTED NOT DETECTED Final   Staphylococcus epidermidis NOT DETECTED NOT DETECTED Final   Staphylococcus lugdunensis NOT DETECTED NOT DETECTED Final   Streptococcus species DETECTED (Eulises Kijowski) NOT DETECTED Final    Comment: CRITICAL RESULT CALLED TO, READ BACK BY AND VERIFIED WITH: PHARMD Dean Alexandre Faries 1153 FCP    Streptococcus agalactiae NOT DETECTED NOT DETECTED Final   Streptococcus pneumoniae NOT DETECTED NOT DETECTED Final   Streptococcus pyogenes DETECTED (Jondavid Schreier) NOT DETECTED Final    Comment: CRITICAL RESULT CALLED TO, READ BACK BY AND VERIFIED WITH: PHARMD Dean Jeancarlos Marchena 1153 FCP    Veola Cafaro.calcoaceticus-baumannii NOT DETECTED NOT DETECTED Final   Bacteroides fragilis NOT DETECTED NOT DETECTED Final   Enterobacterales NOT DETECTED NOT DETECTED Final   Enterobacter cloacae complex NOT DETECTED NOT DETECTED Final   Escherichia coli NOT DETECTED NOT DETECTED Final   Klebsiella aerogenes NOT DETECTED NOT DETECTED Final   Klebsiella oxytoca NOT DETECTED NOT DETECTED Final   Klebsiella pneumoniae NOT DETECTED NOT DETECTED Final   Proteus species NOT DETECTED NOT DETECTED Final   Salmonella species NOT DETECTED NOT DETECTED Final   Serratia marcescens NOT DETECTED NOT DETECTED Final   Haemophilus influenzae NOT DETECTED NOT DETECTED Final   Neisseria meningitidis NOT DETECTED NOT DETECTED Final   Pseudomonas aeruginosa NOT DETECTED NOT DETECTED Final   Stenotrophomonas maltophilia NOT DETECTED  NOT DETECTED Final   Candida albicans NOT DETECTED NOT DETECTED Final   Candida auris NOT DETECTED NOT DETECTED Final   Candida glabrata NOT DETECTED NOT DETECTED Final   Candida krusei NOT DETECTED NOT DETECTED Final   Candida parapsilosis NOT DETECTED NOT DETECTED Final   Candida tropicalis NOT DETECTED NOT DETECTED Final   Cryptococcus neoformans/gattii NOT DETECTED NOT DETECTED Final    Comment: Performed at [facility], [address] [place], [place] [address]  Culture, blood (Routine x 2)     Status: None (Preliminary result)   Collection Time: 10/18/22  3:31 PM   Specimen: BLOOD  Result Value Ref Range Status   Specimen Description   Final    BLOOD LEFT ANTECUBITAL Performed at [facility], [address] [place], [place] [address]    Special Requests   Final    BOTTLES DRAWN AEROBIC AND ANAEROBIC Blood Culture adequate volume Performed at [facility], [address] [place], [place] [address]    Culture   Final    NO GROWTH < 24 HOURS Performed at [facility], [address] [place], [place] [address]    Report Status PENDING  Incomplete         Radiology Studies: US Venous Img Lower Right (DVT Study)  Result Date: 10/18/2022 CLINICAL DATA:  Right knee cellulitis, now with pain and edema. Evaluate for DVT. EXAM: RIGHT LOWER EXTREMITY VENOUS DOPPLER ULTRASOUND TECHNIQUE: Gray-scale sonography with graded compression, as well as color Doppler and duplex ultrasound were performed to evaluate the lower extremity deep venous systems from the level of the common femoral vein and including the common femoral, femoral, profunda femoral, popliteal and calf veins including the posterior tibial, peroneal and gastrocnemius veins when visible. The superficial great saphenous vein was also interrogated. Spectral Doppler was utilized to evaluate flow at rest and with distal augmentation maneuvers in the common femoral, femoral  and popliteal veins. COMPARISON:  None Available. FINDINGS: Common Femoral Vein: No evidence of thrombus. Normal compressibility, respiratory phasicity and response to augmentation. Saphenofemoral Junction: No evidence of thrombus. Normal compressibility and flow on color Doppler imaging. Profunda Femoral Vein: No evidence of thrombus. Normal compressibility and flow on color Doppler imaging. Femoral Vein: No evidence of thrombus. Normal compressibility, respiratory phasicity and response to augmentation. Popliteal Vein: No evidence of thrombus. Normal compressibility, respiratory phasicity and response to augmentation. Calf Veins: No evidence of thrombus. Normal compressibility and flow on color Doppler imaging. Superficial Great Saphenous Vein: No evidence of thrombus. Normal compressibility. Other Findings: Note is made of Khloey Chern mildly prominent though non pathologically enlarged and benign-appearing right inguinal lymph node. The lymph node is not enlarged by size criteria measuring 1 cm in greatest short axis diameter and maintains Alesia Oshields benign fatty hilum (image 8), presumably reactive in etiology. IMPRESSION: No evidence of DVT within the right lower extremity. Electronically Signed   By: Simonne Come M.D.   On: 10/18/2022 16:46   DG Chest 2 View  Result Date: 10/18/2022 CLINICAL DATA:  Extremity cellulitis, infection, concern for sepsis EXAM: CHEST - 2 VIEW COMPARISON:  None Available. FINDINGS: The heart size and mediastinal contours are within normal limits. Both lungs are clear. The visualized skeletal structures are unremarkable. IMPRESSION: No active cardiopulmonary disease. Electronically Signed   By: Judie Petit.  Shick M.D.   On: 10/18/2022 16:19   DG Knee Complete 4 Views Right  Result Date: 10/18/2022 CLINICAL DATA:  Right knee pain, soft tissue swelling, concern for cellulitis EXAM: RIGHT KNEE - COMPLETE 4+ VIEW COMPARISON:  03/13/2020 FINDINGS: No evidence of fracture, dislocation, or joint effusion. No  evidence of arthropathy or other focal bone abnormality. Diffuse soft tissue swelling noted more pronounced laterally. IMPRESSION: Soft tissue swelling. No acute osseous finding. Electronically Signed   By: Judie Petit.  Shick M.D.   On: 10/18/2022 16:18        Scheduled Meds:  enoxaparin (LOVENOX) injection  40 mg Subcutaneous Q24H   Continuous Infusions:  lactated ringers     linezolid (ZYVOX) IV     penicillin G potassium 12 Million Units in dextrose 5 % 500 mL CONTINUOUS infusion       LOS: 1 day    Time spent: over 30 min 40 min critical care time with sepsis    Lacretia Nicks, MD [facility]   To contact the attending provider between 7A-7P or the covering provider during after hours 7P-7A, please log into the web site [email or web address] and access using universal [place] password for that web site. If you do not have the password, please call the hospital operator.  10/19/2022, 1:49 PM

## 2022-10-19 NOTE — Progress Notes (Addendum)
TR[facility] night cross cover note:   I was notified by RN that this patient's blood pressure is now slightly lower, most recently 96/59 with heart rates in the 70s, compared to systolic blood pressures in the low 100s during in the latter part of dayshift as well as to night shift.  Most recent temperature 98.5, respiratory rate 18, and he is maintaining oxygen saturations of 100% on room air.  Per brief chart review, he is here with sepsis due to cellulitis. is currently on lactated Ringer's at 150 cc/h.  I subsequently ordered a 500 cc LR bolus.   Update: BP without significant improvement following 500 cc LR bolus.  The patient is asymptomatic at this time.  Will proceed with a 1 L LR bolus, with close monitoring of ensuing blood pressure trend in response to these IV fluids.    Newton Pigg, DO Hospitalist

## 2022-10-20 ENCOUNTER — Inpatient Hospital Stay (HOSPITAL_COMMUNITY): Payer: Commercial Managed Care - HMO

## 2022-10-20 DIAGNOSIS — A419 Sepsis, unspecified organism: Secondary | ICD-10-CM | POA: Diagnosis not present

## 2022-10-20 DIAGNOSIS — L0390 Cellulitis, unspecified: Secondary | ICD-10-CM | POA: Diagnosis not present

## 2022-10-20 DIAGNOSIS — R7989 Other specified abnormal findings of blood chemistry: Secondary | ICD-10-CM | POA: Diagnosis not present

## 2022-10-20 DIAGNOSIS — K820 Obstruction of gallbladder: Secondary | ICD-10-CM | POA: Diagnosis not present

## 2022-10-20 LAB — COMPREHENSIVE METABOLIC PANEL
ALT: 271 U/L — ABNORMAL HIGH (ref 0–44)
AST: 406 U/L — ABNORMAL HIGH (ref 15–41)
Albumin: 2.2 g/dL — ABNORMAL LOW (ref 3.5–5.0)
Alkaline Phosphatase: 75 U/L (ref 38–126)
Anion gap: 8 (ref 5–15)
BUN: 14 mg/dL (ref 6–20)
CO2: 24 mmol/L (ref 22–32)
Calcium: 7.8 mg/dL — ABNORMAL LOW (ref 8.9–10.3)
Chloride: 98 mmol/L (ref 98–111)
Creatinine, Ser: 1.14 mg/dL (ref 0.61–1.24)
GFR, Estimated: >60 mL/min (ref >60)
Glucose, Bld: 112 mg/dL — ABNORMAL HIGH (ref 70–99)
Potassium: 3.5 mmol/L (ref 3.5–5.1)
Sodium: 130 mmol/L — ABNORMAL LOW (ref 135–145)
Total Bilirubin: 1.1 mg/dL (ref 0.3–1.2)
Total Protein: 5.1 g/dL — ABNORMAL LOW (ref 6.5–8.1)

## 2022-10-20 LAB — CBC WITH DIFFERENTIAL/PLATELET
Abs Immature Granulocytes: 0.17 10*3/uL — ABNORMAL HIGH (ref 0.00–0.07)
Basophils Absolute: 0.1 10*3/uL (ref 0.0–0.1)
Basophils Relative: 0 %
Eosinophils Absolute: 0.1 10*3/uL (ref 0.0–0.5)
Eosinophils Relative: 1 %
HCT: 32.7 % — ABNORMAL LOW (ref 39.0–52.0)
Hemoglobin: 12 g/dL — ABNORMAL LOW (ref 13.0–17.0)
Immature Granulocytes: 1 %
Lymphocytes Relative: 2 %
Lymphs Abs: 0.4 10*3/uL — ABNORMAL LOW (ref 0.7–4.0)
MCH: 32.1 pg (ref 26.0–34.0)
MCHC: 36.7 g/dL — ABNORMAL HIGH (ref 30.0–36.0)
MCV: 87.4 fL (ref 80.0–100.0)
Monocytes Absolute: 0.7 10*3/uL (ref 0.1–1.0)
Monocytes Relative: 4 %
Neutro Abs: 18.5 10*3/uL — ABNORMAL HIGH (ref 1.7–7.7)
Neutrophils Relative %: 92 %
Platelets: 172 10*3/uL (ref 150–400)
RBC: 3.74 MIL/uL — ABNORMAL LOW (ref 4.22–5.81)
RDW: 12.2 % (ref 11.5–15.5)
WBC: 19.9 10*3/uL — ABNORMAL HIGH (ref 4.0–10.5)
nRBC: 0 % (ref 0.0–0.2)

## 2022-10-20 LAB — C-REACTIVE PROTEIN: CRP: 28.3 mg/dL — ABNORMAL HIGH (ref <1.0)

## 2022-10-20 LAB — PHOSPHORUS: Phosphorus: 1.4 mg/dL — ABNORMAL LOW (ref 2.5–4.6)

## 2022-10-20 LAB — ECHOCARDIOGRAM COMPLETE
AR max vel: 3.66 cm2
AV Peak grad: 5.6 mm[Hg]
Ao pk vel: 1.18 m/s
Area-P 1/2: 4.63 cm2
Height: 72 in
S' Lateral: 3.4 cm
Weight: 2649.05 [oz_av]

## 2022-10-20 LAB — MAGNESIUM: Magnesium: 1.8 mg/dL (ref 1.7–2.4)

## 2022-10-20 LAB — HEPATITIS PANEL, ACUTE
HCV Ab: NONREACTIVE
Hep A IgM: NONREACTIVE
Hep B C IgM: NONREACTIVE
Hepatitis B Surface Ag: NONREACTIVE

## 2022-10-20 LAB — LACTIC ACID, PLASMA: Lactic Acid, Venous: 1.2 mmol/L (ref 0.5–1.9)

## 2022-10-20 LAB — HEMOGLOBIN A1C
Hgb A1c MFr Bld: 5 % (ref 4.8–5.6)
Mean Plasma Glucose: 96.8 mg/dL

## 2022-10-20 MED ORDER — LINEZOLID 600 MG PO TABS
600.0000 mg | ORAL_TABLET | Freq: Two times a day (BID) | ORAL | Status: AC
  Administered 2022-10-20 – 2022-10-27 (×15): 600 mg via ORAL
  Filled 2022-10-20 (×15): qty 1

## 2022-10-20 MED ORDER — LACTATED RINGERS IV BOLUS
1000.0000 mL | Freq: Once | INTRAVENOUS | Status: AC
  Administered 2022-10-20: 1000 mL via INTRAVENOUS

## 2022-10-20 MED ORDER — K PHOS MONO-SOD PHOS DI & MONO 155-852-130 MG PO TABS
500.0000 mg | ORAL_TABLET | Freq: Four times a day (QID) | ORAL | Status: AC
  Administered 2022-10-20 – 2022-10-22 (×7): 500 mg via ORAL
  Filled 2022-10-20 (×8): qty 2

## 2022-10-20 NOTE — Progress Notes (Signed)
PROGRESS NOTE    RYLON POITRA  WUJ:[id number] DOB: 1995-10-15 DOA: 10/18/2022 PCP: Pcp, No  Chief Complaint  Patient presents with   Knee Pain    Brief Narrative:   CANDON CARAS is Dung Prien 27 y.o. 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  He was working in the yard when he accidentally cut his leg around the lateral aspect of the right knee with Julizza Sassone metal pipe.  He presented to the ED from urgent care with fevers, chills, nausea, vomiting -> sepsis -> was admitted for sepsis due to right lower extremity cellulitis.  Assessment & Plan:   Principal Problem:   Sepsis due to cellulitis [facility]) Active Problems:   AKI (acute kidney injury) (HCC)   Hyponatremia  Sepsis due to Strep pyogenes Bacteremia Concern for Septic Arthritis  Cellulitis of Right Lower Extremity  Met criteria for sepsis on presentation, continues to meet criteria for sepsis with tachycardia, fever, leukocytosis Intermittent hypotension with systolics in the 90's Blood cultures with gram positive cocci (BCID with strep pyogenes) Concern for septic arthritis -> orthopedics consulted (PA messaged on secure chat) and MRI R knee pending Procalcitonin 45 CRP 34.2 Penicillin and linezolid  ID c/s Repeat cultures 10/4 AM Echocardiogram  S/p Tdap 10/2 in ED Repeat lactic acid pending Low threshold for critical care c/s if continued issues with hypotension  Elevated LFT's ? Shock liver RUQ [place] pending Acute hepatitis panel   Acute kidney Injury  Baseline creatinine unknown Suspect due to sepsis Follow renal US (normal), UA unrevealing Continue IVF    DVT prophylaxis: lovenox Code Status: full Family Communication: friend at bedside Disposition:   Status is: Inpatient Remains inpatient appropriate because: need for continued inpatient care   Consultants:   orthopedics  Procedures:  none  Antimicrobials:  Anti-infectives (From admission, onward)    Start     Dose/Rate Route Frequency Ordered Stop   10/20/22 2200  linezolid (ZYVOX) tablet 600 mg        600 mg Oral Every 12 hours 10/20/22 1018     10/19/22 1300  penicillin G potassium 12 Million Units in dextrose 5 % 500 mL CONTINUOUS infusion        12 Million Units 41.7 mL/hr over 12 Hours Intravenous Every 12 hours 10/19/22 1213     10/19/22 1300  linezolid (ZYVOX) IVPB 600 mg  Status:  Discontinued        600 mg 300 mL/hr over 60 Minutes Intravenous Every 12 hours 10/19/22 1213 10/20/22 1018   10/19/22 0600  vancomycin (VANCOCIN) IVPB 1000 mg/200 mL premix  Status:  Discontinued        1,000 mg 200 mL/hr over 60 Minutes Intravenous Every 12 hours 10/18/22 1734 10/19/22 1213   10/19/22 0000  ceFEPIme (MAXIPIME) 2 g in sodium chloride 0.9 % 100 mL IVPB  Status:  Discontinued        2 g 200 mL/hr over 30 Minutes Intravenous Every 8 hours 10/18/22 1734 10/19/22 1213   10/18/22 2130  vancomycin (VANCOCIN) IVPB 1000 mg/200 mL premix  Status:  Discontinued        1,000 mg 200 mL/hr over 60 Minutes Intravenous  Once 10/18/22 2033 10/18/22 2047   10/18/22 1630  ceFEPIme (MAXIPIME) 2 g in sodium chloride 0.9 % 100 mL IVPB        2 g 200 mL/hr over 30 Minutes Intravenous  Once 10/18/22 1616 10/18/22 1820   10/18/22 1630  metroNIDAZOLE (FLAGYL) IVPB 500 mg        500 mg 100 mL/hr over 60 Minutes Intravenous  Once 10/18/22 1616 10/18/22 1819   10/18/22 1630  vancomycin (VANCOCIN) IVPB 1000 mg/200 mL premix        1,000 mg 200 mL/hr over 60 Minutes Intravenous  Once 10/18/22 1616 10/18/22 1924       Subjective: Continued knee pain, unchanged  Objective: Vitals:   10/20/22 0253 10/20/22 0517 10/20/22 0838 10/20/22 1428  BP: (!) 103/53 (!) 108/53 (!) 110/49 (!) 90/52  Pulse: 99 94 94 89  Resp: 16 18 18 18   Temp: 100 F (37.8 C) 98.4 F (36.9 C)  98.9 F (37.2 C)  TempSrc: Oral  Oral    SpO2: 98% 99% 98% 100%  Weight:      Height:        Intake/Output Summary (Last 24 hours) at 10/20/2022 1511 Last data filed at 10/20/2022 0920 Gross per 24 hour  Intake 3056.9 ml  Output 801 ml  Net 2255.9 ml   Filed Weights   10/18/22 1518 10/19/22 0350  Weight: 74.8 kg 75.1 kg    Examination:  General: No acute distress. Cardiovascular: RRR Lungs: unlabored Neurological: Alert and oriented 3. Moves all extremities 4. Extremities: R knee swelling and erythema, no crepitus or fluctuance - seems slightly more swollen today    Data Reviewed: I have personally reviewed following labs and imaging studies  CBC: Recent Labs  Lab 10/18/22 1527 10/18/22 2050 10/19/22 0633 10/20/22 0552  WBC 17.9* 13.9* 11.9* 19.9*  NEUTROABS 16.3*  --   --  18.5*  HGB 16.2 13.8 12.4* 12.0*  HCT 44.9 37.9* 34.6* 32.7*  MCV 86.3 86.1 85.4 87.4  PLT 227 174 155 172    Basic Metabolic Panel: Recent Labs  Lab 10/18/22 1527 10/18/22 2050 10/19/22 0633 10/20/22 0552  NA 131*  --  134* 130*  K 3.7  --  3.7 3.5  CL 93*  --  98 98  CO2 25  --  25 24  GLUCOSE 113*  --  113* 112*  BUN 25*  --  17 14  CREATININE 1.50* 1.43* 1.47* 1.14  CALCIUM 9.5  --  8.0* 7.8*  MG  --   --   --  1.8  PHOS  --   --   --  1.4*    GFR: Estimated Creatinine Clearance: 102.5 mL/min (by C-G formula based on SCr of 1.14 mg/dL).  Liver Function Tests: Recent Labs  Lab 10/18/22 1527 10/19/22 0633 10/20/22 0552  AST 21 48* 406*  ALT 19 36 271*  ALKPHOS 70 54 75  BILITOT 1.2 1.1 1.1  PROT 8.5* 5.4* 5.1*  ALBUMIN 4.6 2.5* 2.2*    CBG: No results for input(s): "GLUCAP" in the last 168 hours.   Recent Results (from the past 240 hour(s))  Culture, blood (Routine x 2)     Status: Abnormal (Preliminary result)   Collection Time: 10/18/22  3:26 PM   Specimen: BLOOD LEFT ARM  Result Value Ref Range Status   Specimen Description   Final    BLOOD LEFT ARM Performed at [facility], [address] [place], [place] [address]    Special Requests   Final    BOTTLES DRAWN AEROBIC AND ANAEROBIC Blood Culture adequate volume Performed at [facility], [address] [place], [place] [address]    Culture  Setup Time  Final    GRAM POSITIVE COCCI IN CHAINS ANAEROBIC BOTTLE ONLY CRITICAL RESULT CALLED TO, READ BACK BY AND VERIFIED WITH: PHARMD BAILEY Mikailah Morel 1153 FCP    Culture (Jawaun Celmer)  Final    GROUP Edrie Ehrich STREP (S.PYOGENES) ISOLATED SUSCEPTIBILITIES TO FOLLOW HEALTH DEPARTMENT NOTIFIED Performed at [facility], [address], [place], [place] [address]    Report Status PENDING  Incomplete  Blood Culture ID Panel (Reflexed)     Status: Abnormal   Collection Time: 10/18/22  3:26 PM  Result Value Ref Range Status   Enterococcus faecalis NOT DETECTED NOT DETECTED Final   Enterococcus Faecium NOT DETECTED NOT DETECTED Final   Listeria monocytogenes NOT DETECTED NOT DETECTED Final   Staphylococcus species NOT DETECTED NOT DETECTED Final   Staphylococcus aureus (BCID) NOT DETECTED NOT DETECTED Final   Staphylococcus epidermidis NOT DETECTED NOT DETECTED Final   Staphylococcus lugdunensis NOT DETECTED NOT DETECTED Final   Streptococcus species DETECTED (Hadi Dubin) NOT DETECTED Final    Comment: CRITICAL RESULT CALLED TO, READ BACK BY AND VERIFIED WITH: PHARMD BAILEY Lyrical Sowle 1153 FCP    Streptococcus agalactiae NOT DETECTED NOT DETECTED Final   Streptococcus pneumoniae NOT DETECTED NOT DETECTED Final   Streptococcus pyogenes DETECTED (Nychelle Cassata) NOT DETECTED Final    Comment: CRITICAL RESULT CALLED TO, READ BACK BY AND VERIFIED WITH: PHARMD BAILEY Lillyann Ahart 1153 FCP    Champ [place].calcoaceticus-baumannii NOT DETECTED NOT DETECTED Final   Bacteroides fragilis NOT DETECTED NOT DETECTED Final   Enterobacterales NOT DETECTED NOT DETECTED Final   Enterobacter cloacae complex NOT DETECTED NOT DETECTED Final   Escherichia coli NOT DETECTED NOT DETECTED Final   Klebsiella aerogenes NOT DETECTED NOT  DETECTED Final   Klebsiella oxytoca NOT DETECTED NOT DETECTED Final   Klebsiella pneumoniae NOT DETECTED NOT DETECTED Final   Proteus species NOT DETECTED NOT DETECTED Final   Salmonella species NOT DETECTED NOT DETECTED Final   Serratia marcescens NOT DETECTED NOT DETECTED Final   Haemophilus influenzae NOT DETECTED NOT DETECTED Final   Neisseria meningitidis NOT DETECTED NOT DETECTED Final   Pseudomonas aeruginosa NOT DETECTED NOT DETECTED Final   Stenotrophomonas maltophilia NOT DETECTED NOT DETECTED Final   Candida albicans NOT DETECTED NOT DETECTED Final   Candida auris NOT DETECTED NOT DETECTED Final   Candida glabrata NOT DETECTED NOT DETECTED Final   Candida krusei NOT DETECTED NOT DETECTED Final   Candida parapsilosis NOT DETECTED NOT DETECTED Final   Candida tropicalis NOT DETECTED NOT DETECTED Final   Cryptococcus neoformans/gattii NOT DETECTED NOT DETECTED Final    Comment: Performed at [facility], [address], [place], [place] [address]  Culture, blood (Routine x 2)     Status: None (Preliminary result)   Collection Time: 10/18/22  3:31 PM   Specimen: BLOOD  Result Value Ref Range Status   Specimen Description   Final    BLOOD LEFT ANTECUBITAL Performed at [facility], [address] [place], [place] [address]    Special Requests   Final    BOTTLES DRAWN AEROBIC AND ANAEROBIC Blood Culture adequate volume Performed at [facility], [address] [place], [place] [address]    Culture   Final    NO GROWTH 2 DAYS Performed at [facility], [address], [place], [place] [address]    Report Status PENDING  Incomplete         Radiology Studies: ECHOCARDIOGRAM COMPLETE  Result Date: 10/20/2022    ECHOCARDIOGRAM REPORT   Patient Name:  Keelon Robyne Askew Date of Exam: 10/19/2022 Medical Rec #:  [id number]     Height:       72.0 in Accession #:    [id number]    Weight:       165.6 lb Date of Birth:  [birth year]       BSA:          1.966 m Patient Age:    27 years      BP:           106/52 mmHg Patient Gender: M             HR:           96 bpm. Exam Location:  Inpatient Procedure: 2D Echo, Cardiac Doppler and Color Doppler Indications:    Bacteremia R78.81  History:        Patient has no prior history of Echocardiogram examinations.  Sonographer:    Lucendia Herrlich RCS Referring Phys: [phone] Auston Halfmann CALDWELL POWELL JR IMPRESSIONS  1. Left ventricular ejection fraction, by estimation, is 55 to 60%. The left ventricle has normal function. The left ventricle has no regional wall motion abnormalities. Left ventricular diastolic parameters were normal.  2. Right ventricular systolic function is normal. The right ventricular size is normal. There is normal pulmonary artery systolic pressure. The estimated right ventricular systolic pressure is 25.1 mmHg.  3. The mitral valve is normal in structure. No evidence of mitral valve regurgitation. No evidence of mitral stenosis.  4. The aortic valve is tricuspid. Aortic valve regurgitation is not visualized. No aortic stenosis is present.  5. The inferior vena cava is dilated in size with >50% respiratory variability, suggesting right atrial pressure of 8 mmHg.  6. No valvular vegetation noted. FINDINGS  Left Ventricle: Left ventricular ejection fraction, by estimation, is 55 to 60%. The left ventricle has normal function. The left ventricle has no regional wall motion abnormalities. The left ventricular internal cavity size was normal in size. There is  no left ventricular hypertrophy. Left ventricular diastolic parameters were normal. Right Ventricle: The right ventricular size is normal. No increase in right ventricular wall thickness. Right ventricular systolic function is normal. There is normal pulmonary artery systolic pressure. The tricuspid regurgitant velocity is 2.07 m/s, and  with an assumed right atrial pressure of 8 mmHg, the estimated right ventricular systolic pressure is 25.1  mmHg. Left Atrium: Left atrial size was normal in size. Right Atrium: Right atrial size was normal in size. Pericardium: Trivial pericardial effusion is present. Mitral Valve: The mitral valve is normal in structure. No evidence of mitral valve regurgitation. No evidence of mitral valve stenosis. Tricuspid Valve: The tricuspid valve is normal in structure. Tricuspid valve regurgitation is trivial. Aortic Valve: The aortic valve is tricuspid. Aortic valve regurgitation is not visualized. No aortic stenosis is present. Aortic valve peak gradient measures 5.6 mmHg. Pulmonic Valve: The pulmonic valve was normal in structure. Pulmonic valve regurgitation is trivial. Aorta: The aortic root is normal in size and structure. Venous: The inferior vena cava is dilated in size with greater than 50% respiratory variability, suggesting right atrial pressure of 8 mmHg. IAS/Shunts: No atrial level shunt detected by color flow Doppler.  LEFT VENTRICLE PLAX 2D LVIDd:         5.30 cm   Diastology LVIDs:         3.40 cm   LV e' medial:    10.60 cm/s LV PW:         1.00 cm  LV E/e' medial:  8.0 LV IVS:        0.70 cm   LV e' lateral:   16.60 cm/s LVOT diam:     2.40 cm   LV E/e' lateral: 5.1 LV SV:         73 LV SV Index:   37 LVOT Area:     4.52 cm  RIGHT VENTRICLE             IVC RV S prime:     19.20 cm/s  IVC diam: 2.30 cm TAPSE (M-mode): 2.4 cm LEFT ATRIUM             Index        RIGHT ATRIUM           Index LA diam:        3.80 cm 1.93 cm/m   RA Area:     15.50 cm LA Vol (A2C):   57.9 ml 29.45 ml/m  RA Volume:   40.10 ml  20.40 ml/m LA Vol (A4C):   47.5 ml 24.16 ml/m LA Biplane Vol: 54.6 ml 27.77 ml/m  AORTIC VALVE                 PULMONIC VALVE AV Area (Vmax): 3.66 cm     PR End Diast Vel: 2.03 msec AV Vmax:        118.00 cm/s AV Peak Grad:   5.6 mmHg LVOT Vmax:      95.50 cm/s LVOT Vmean:     62.400 cm/s LVOT VTI:       0.161 m  AORTA Ao Root diam: 3.30 cm Ao Asc diam:  3.00 cm MITRAL VALVE               TRICUSPID  VALVE MV Area (PHT): 4.63 cm    TR Peak grad:   17.1 mmHg MV Decel Time: 164 msec    TR Vmax:        207.00 cm/s MV E velocity: 84.40 cm/s MV Anthoney Sheppard velocity: 51.40 cm/s  SHUNTS MV E/Teah [place] ratio:  1.64        Systemic VTI:  0.16 m                            Systemic Diam: 2.40 cm Dalton McleanMD Electronically signed by Wilfred Lacy Signature Date/Time: 10/20/2022/8:33:34 AM    Final    US RENAL  Result Date: 10/19/2022 CLINICAL DATA:  Acute kidney injury EXAM: RENAL / URINARY TRACT ULTRASOUND COMPLETE COMPARISON:  None Available. FINDINGS: Right Kidney: Renal measurements: 13 x 4.2 x 5.5 cm = volume: 157 mL. Echogenicity within normal limits. No mass or hydronephrosis visualized. Left Kidney: Renal measurements: 10.6 x 5.9 x 5.3 cm = volume: 174 mL. Echogenicity within normal limits. No mass or hydronephrosis visualized. Bladder: Incompletely distended Other: None. IMPRESSION: Normal renal ultrasound. Electronically Signed   By: Corlis Leak M.D.   On: 10/19/2022 21:18   MR KNEE RIGHT WO CONTRAST  Result Date: 10/19/2022 CLINICAL DATA:  Recent right lateral knee laceration with increasing pain, swelling and erythema. Evaluate for septic arthritis or cellulitis. History of ACL tear. EXAM: MRI OF THE RIGHT KNEE WITHOUT CONTRAST TECHNIQUE: Multiplanar, multisequence MR imaging of the knee was performed. No intravenous contrast was administered. COMPARISON:  Radiographs 10/18/2022 and 03/13/2020. FINDINGS: MENISCI Medial meniscus:  Intact with normal morphology. Lateral meniscus: Possible small radial tear at the junction of the anterior horn and body, best seen on  sagittal image 8/5 and coronal image 10/4. No displaced meniscal fragment. LIGAMENTS Cruciates: The anterior cruciate ligament fibers are indistinct, especially distally. There is no edema in the intercondylar notch, and this may relate to the reported previous ACL injury. The PCL is intact. Collaterals: The medial and lateral collateral ligament complexes  are intact. CARTILAGE Patellofemoral:  Preserved. Medial:  Preserved. Lateral: There is Kasarah Sitts chronic impaction injury of the lateral femoral condyle anteriorly with associated overlying chondral thinning, grossly stable from previous radiographs. MISCELLANEOUS Joint:  Small nonspecific knee joint effusion. Popliteal Fossa: The popliteus muscle and tendon are intact. No significant Baker's cyst. Extensor Mechanism: The visualized quadriceps and patellar tendons are intact. 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 No evidence of acute fracture, dislocation or osteomyelitis. Other: Severe subcutaneous edema surrounding the knee, greatest laterally. Edema extends into the popliteal fossa and Hoffa's fat. No focal fluid collections are identified on noncontrast imaging. No focal muscular abnormalities are seen. IMPRESSION: 1. Severe subcutaneous edema surrounding the knee, greatest laterally. No focal fluid collections identified on noncontrast imaging. In this clinical context, these findings are consistent with cellulitis. The full extent of this is not imaged on this examination of the knee, and fasciitis and compartment syndrome not excluded. 2. Small nonspecific knee joint effusion. No evidence of osteomyelitis. 3. Chronic impaction fracture of the lateral femoral condyle anteriorly with associated overlying chondral thinning, grossly stable from previous radiographs. No acute osseous findings. 4. Possible small radial tear at the junction of the anterior horn and body of the lateral meniscus. The medial meniscus, collateral ligaments and PCL are intact. 5. Indistinct anterior cruciate ligament fibers, especially distally, likely related to the reported previous ACL injury. No acute ligamentous findings. Electronically Signed   By: Carey Bullocks M.D.   On: 10/19/2022 17:34   US Venous Img  Lower Right (DVT Study)  Result Date: 10/18/2022 CLINICAL DATA:  Right knee cellulitis, now with pain and edema. Evaluate for DVT. EXAM: RIGHT LOWER EXTREMITY VENOUS DOPPLER ULTRASOUND TECHNIQUE: Gray-scale sonography with graded compression, as well as color Doppler and duplex ultrasound were performed to evaluate the lower extremity deep venous systems from the level of the common femoral vein and including the common femoral, femoral, profunda femoral, popliteal and calf veins including the posterior tibial, peroneal and gastrocnemius veins when visible. The superficial great saphenous vein was also interrogated. Spectral Doppler was utilized to evaluate flow at rest and with distal augmentation maneuvers in the common femoral, femoral and popliteal veins. COMPARISON:  None Available. FINDINGS: Common Femoral Vein: No evidence of thrombus. Normal compressibility, respiratory phasicity and response to augmentation. Saphenofemoral Junction: No evidence of thrombus. Normal compressibility and flow on color Doppler imaging. Profunda Femoral Vein: No evidence of thrombus. Normal compressibility and flow on color Doppler imaging. Femoral Vein: No evidence of thrombus. Normal compressibility, respiratory phasicity and response to augmentation. Popliteal Vein: No evidence of thrombus. Normal compressibility, respiratory phasicity and response to augmentation. Calf Veins: No evidence of thrombus. Normal compressibility and flow on color Doppler imaging. Superficial Great Saphenous Vein: No evidence of thrombus. Normal compressibility. Other Findings: Note is made of Dahlila Pfahler mildly prominent though non pathologically enlarged and benign-appearing right inguinal lymph node. The lymph node is not enlarged by size criteria measuring 1 cm in greatest short axis diameter and maintains Amyre Segundo benign fatty hilum (image 8), presumably reactive in etiology. IMPRESSION: No evidence of DVT within the right  lower extremity. Electronically  Signed   By: Simonne Come M.D.   On: 10/18/2022 16:46   DG Chest 2 View  Result Date: 10/18/2022 CLINICAL DATA:  Extremity cellulitis, infection, concern for sepsis EXAM: CHEST - 2 VIEW COMPARISON:  None Available. FINDINGS: The heart size and mediastinal contours are within normal limits. Both lungs are clear. The visualized skeletal structures are unremarkable. IMPRESSION: No active cardiopulmonary disease. Electronically Signed   By: Judie Petit.  Shick M.D.   On: 10/18/2022 16:19   DG Knee Complete 4 Views Right  Result Date: 10/18/2022 CLINICAL DATA:  Right knee pain, soft tissue swelling, concern for cellulitis EXAM: RIGHT KNEE - COMPLETE 4+ VIEW COMPARISON:  03/13/2020 FINDINGS: No evidence of fracture, dislocation, or joint effusion. No evidence of arthropathy or other focal bone abnormality. Diffuse soft tissue swelling noted more pronounced laterally. IMPRESSION: Soft tissue swelling. No acute osseous finding. Electronically Signed   By: Judie Petit.  Shick M.D.   On: 10/18/2022 16:18        Scheduled Meds:  enoxaparin (LOVENOX) injection  40 mg Subcutaneous Q24H   linezolid  600 mg Oral Q12H   phosphorus  500 mg Oral QID   Continuous Infusions:  lactated ringers 125 mL/hr at 10/20/22 0543   penicillin G potassium 12 Million Units in dextrose 5 % 500 mL CONTINUOUS infusion 12 Million Units (10/20/22 0541)     LOS: 2 days    Time spent: over 30 min 40 min critical care time with sepsis    Lacretia Nicks, MD [facility]   To contact the attending provider between 7A-7P or the covering provider during after hours 7P-7A, please log into the web site [email or web address] and access using universal [place] password for that web site. If you do not have the password, please call the hospital operator.  10/20/2022, 3:11 PM

## 2022-10-20 NOTE — Progress Notes (Signed)
TR[facility] night cross cover note:   I was notified by RN of the patient's blood pressure, most recently 93/36 with MAP of 53 mmHg, in this patient her with cellulitis.  No new complaints at this time.  Additional vital signs appear stable, including afebrile, heart rates in the 90s, and oxygen saturation 97 to 98% on room air.  He is currently on lactated Ringer's at 125 cc/h.  I subsequently placed order for a 1 L LR bolus.     Newton Pigg, DO Hospitalist

## 2022-10-20 NOTE — Progress Notes (Signed)
[facility]  Date of Admission:  10/18/2022      Total days of antibiotics 3  Penicillin 10/03 >>  Linezolid 10/03 >>   Vanc/Cefepime 10/02 >> 10/03            ASSESSMENT: Dean Nichols is a 27 y.o. male otherwise healthy presenting with acute abrupt illness after laceration of right knee Monday 10/16/22 - found to have:      Group A Streptococcus Bacteremia -  -Continue IV penicillin infusion  -Continue Linezolid - if continues to hold down POs can switch from IV to PO this evening.  -Not typical offender for endocarditis given pyogenic strep - don't feel strongly we need to pursue endocarditis work up at this time  -Transaminitis > 2 fold - see below.  -Hypotensive O/N requiring IVF bolus and maintenance IVF - no tachycardia, no fevers > 100. Follow closely - if another episode of hypotension would consider consult with PCCM for supportive care in setting of possible TSS presentation.      Right Knee / LE Cellulitis 2/2 Wound -  -Knee joint does not seem involved as he is able to bear weight and perform ROM to at least 90d. Feels more tight around the knee vs in it. Ortho has seen and also don't suspect intraarticular infection.  -Continue to follow on IV antibiotics      SIRS  -  -Hypotensive O/N requiring IVF bolus and maintenance IVF - no tachycardia, no fevers > 100. Follow closely - if another episode of hypotension would consider consult with PCCM for supportive care out of concern for possible TSS presentation.   Transaminitis -  -Greater than 5x increase in ALT/AST over night - may be more due to hypotension/sepsis.  -D/W [name] - RUQ ultrasound (he is non-tender, non-icteric and n/v improved).  -FU acute hepatitis panel    PLAN: Continue IV pcn  If holding POs down and no further hypotension can switch linezolid to PO for next dose  Continue maintenance IVF Consider PCCM consult if needs BP intervention again.  FU RUQ  U/S  Repeat lactic acid + CRP   [name] is covering over the weekend and will continue to follow     Principal Problem:   Sepsis due to cellulitis [facility]) Active Problems:   AKI (acute kidney injury) (HCC)   Hyponatremia    enoxaparin (LOVENOX) injection  40 mg Subcutaneous Q24H   linezolid  600 mg Oral Q12H   phosphorus  500 mg Oral QID    SUBJECTIVE: Feeling about the same. No longer vomiting and holding down PO medications.  Not much appetite  Has some peri-umbilical abdominal discomfort.  Rt knee pain about the same - got up and walked / hobbled to the bathroom and unable to put weight on it much.  IVF o/n for SBP low 90s.  No new complaints.  TMax 100 - getting regular tylenol    Review of Systems: Review of Systems  Constitutional:  Positive for diaphoresis and malaise/fatigue. Negative for fever.  Cardiovascular:  Positive for leg swelling (rt).  Gastrointestinal:  Positive for abdominal pain (peri-umbilical discomfort.) and nausea. Negative for vomiting.  Genitourinary: Negative.   Musculoskeletal:  Positive for joint pain.  Skin:  Positive for rash (stable lymphangitic spread up right inner leg.).  Neurological:  Negative for dizziness and headaches.     No Known Allergies  OBJECTIVE: Vitals:   10/20/22 0037 10/20/22 0253 10/20/22  0517 10/20/22 0838  BP: (!) 93/36 (!) 103/53 (!) 108/53 (!) 110/49  Pulse: 99 99 94 94  Resp:  16 18 18   Temp:  100 F (37.8 C) 98.4 F (36.9 C)   TempSrc:  Oral Oral   SpO2: 97% 98% 99% 98%  Weight:      Height:       Body mass index is 22.45 kg/m.   Physical Exam Vitals and nursing note reviewed.  Constitutional:      General: He is not in acute distress.    Appearance: He is ill-appearing. He is not toxic-appearing.  HENT:     Mouth/Throat:     Mouth: Mucous membranes are moist.     Pharynx: No posterior oropharyngeal erythema.  Eyes:     General: No scleral icterus.    Conjunctiva/sclera: Conjunctivae normal.      Pupils: Pupils are equal, round, and reactive to light.  Cardiovascular:     Rate and Rhythm: Normal rate and regular rhythm.     Heart sounds: No murmur heard. Pulmonary:     Effort: Pulmonary effort is normal.     Breath sounds: Normal breath sounds.  Abdominal:     General: There is no distension.     Tenderness: There is no abdominal tenderness.     Comments: peri-umbilical discomfort reported but not guarding and no rebound on exam.  No hepatomegaly appreciated. No RUQ tenderness  Musculoskeletal:        General: Swelling (Rt thigh swollen. Stable appearing cut on lateral knee) present.  Skin:    General: Skin is warm.     Capillary Refill: Capillary refill takes less than 2 seconds.     Findings: Erythema (streaking erythema up inner thigh similar as yesterday) present.  Neurological:     Mental Status: He is alert and oriented to person, place, and time.      Lab Results Lab Results  Component Value Date   WBC 19.9 (H) 10/20/2022   HGB 12.0 (L) 10/20/2022   HCT 32.7 (L) 10/20/2022   MCV 87.4 10/20/2022   PLT 172 10/20/2022    Lab Results  Component Value Date   CREATININE 1.14 10/20/2022   BUN 14 10/20/2022   NA 130 (L) 10/20/2022   K 3.5 10/20/2022   CL 98 10/20/2022   CO2 24 10/20/2022    Lab Results  Component Value Date   ALT 271 (H) 10/20/2022   AST 406 (H) 10/20/2022   ALKPHOS 75 10/20/2022   BILITOT 1.1 10/20/2022     Microbiology: Recent Results (from the past 240 hour(s))  Culture, blood (Routine x 2)     Status: Abnormal (Preliminary result)   Collection Time: 10/18/22  3:26 PM   Specimen: BLOOD LEFT ARM  Result Value Ref Range Status   Specimen Description   Final    BLOOD LEFT ARM Performed at [facility], [address] [place], [place] [address]    Special Requests   Final    BOTTLES DRAWN AEROBIC AND ANAEROBIC Blood Culture adequate volume Performed at [facility], [address] [place],  [place] [address]    Culture  Setup Time   Final    GRAM POSITIVE COCCI IN CHAINS ANAEROBIC BOTTLE ONLY CRITICAL RESULT CALLED TO, READ BACK BY AND VERIFIED WITH: PHARMD BAILEY A 1153 FCP    Culture (A)  Final    GROUP A STREP (S.PYOGENES) ISOLATED SUSCEPTIBILITIES TO FOLLOW Performed at [facility], [address] [place], [place] [address]    Report Status PENDING  Incomplete  Blood Culture ID Panel (Reflexed)     Status: Abnormal   Collection Time: 10/18/22  3:26 PM  Result Value Ref Range Status   Enterococcus faecalis NOT DETECTED NOT DETECTED Final   Enterococcus Faecium NOT DETECTED NOT DETECTED Final   Listeria monocytogenes NOT DETECTED NOT DETECTED Final   Staphylococcus species NOT DETECTED NOT DETECTED Final   Staphylococcus aureus (BCID) NOT DETECTED NOT DETECTED Final   Staphylococcus epidermidis NOT DETECTED NOT DETECTED Final   Staphylococcus lugdunensis NOT DETECTED NOT DETECTED Final   Streptococcus species DETECTED (A) NOT DETECTED Final    Comment: CRITICAL RESULT CALLED TO, READ BACK BY AND VERIFIED WITH: PHARMD BAILEY A 1153 FCP    Streptococcus agalactiae NOT DETECTED NOT DETECTED Final   Streptococcus pneumoniae NOT DETECTED NOT DETECTED Final   Streptococcus pyogenes DETECTED (A) NOT DETECTED Final    Comment: CRITICAL RESULT CALLED TO, READ BACK BY AND VERIFIED WITH: PHARMD BAILEY A 1153 FCP    A.calcoaceticus-baumannii NOT DETECTED NOT DETECTED Final   Bacteroides fragilis NOT DETECTED NOT DETECTED Final   Enterobacterales NOT DETECTED NOT DETECTED Final   Enterobacter cloacae complex NOT DETECTED NOT DETECTED Final   Escherichia coli NOT DETECTED NOT DETECTED Final   Klebsiella aerogenes NOT DETECTED NOT DETECTED Final   Klebsiella oxytoca NOT DETECTED NOT DETECTED Final   Klebsiella pneumoniae NOT DETECTED NOT DETECTED Final   Proteus species NOT DETECTED NOT DETECTED Final   Salmonella species NOT DETECTED NOT DETECTED Final   Serratia  marcescens NOT DETECTED NOT DETECTED Final   Haemophilus influenzae NOT DETECTED NOT DETECTED Final   Neisseria meningitidis NOT DETECTED NOT DETECTED Final   Pseudomonas aeruginosa NOT DETECTED NOT DETECTED Final   Stenotrophomonas maltophilia NOT DETECTED NOT DETECTED Final   Candida albicans NOT DETECTED NOT DETECTED Final   Candida auris NOT DETECTED NOT DETECTED Final   Candida glabrata NOT DETECTED NOT DETECTED Final   Candida krusei NOT DETECTED NOT DETECTED Final   Candida parapsilosis NOT DETECTED NOT DETECTED Final   Candida tropicalis NOT DETECTED NOT DETECTED Final   Cryptococcus neoformans/gattii NOT DETECTED NOT DETECTED Final    Comment: Performed at [facility], [address], [place], [place] [address]  Culture, blood (Routine x 2)     Status: None (Preliminary result)   Collection Time: 10/18/22  3:31 PM   Specimen: BLOOD  Result Value Ref Range Status   Specimen Description   Final    BLOOD LEFT ANTECUBITAL Performed at [facility], [address], [place], [place] [address]    Special Requests   Final    BOTTLES DRAWN AEROBIC AND ANAEROBIC Blood Culture adequate volume Performed at [facility], [address], [place], [place] [address]    Culture   Final    NO GROWTH 2 DAYS Performed at [facility], [address] [place], [place] [address]    Report Status PENDING  Incomplete    Rexene Alberts, MSN, NP-C [facility]  [email or web address] Pager: [phone] Office: [phone] [facility] Main Line: [phone] *Secure Chat Communication Welcome

## 2022-10-20 NOTE — TOC Initial Note (Signed)
Transition of Care [facility]) - Initial/Assessment Note    Patient Details  Name: Dean Nichols MRN: [id number] Date of Birth: [birth year]  [facility] CM/SW Contact:    Janae Bridgeman, RN Phone Number: 10/20/2022, 4:08 PM  Clinical Narrative:                 CM met with the patient at the bedside to discuss TOC needs.  The patient admitted to the hospital with Septicemia relating to cellulitis in his leg after cutting it on a pipe at work.  Patient works as a Administrator.  Patient continues to be inpatient to receive IV antibiotics at this time and will be followed by ID over the weekend.  CM received call from [facility] - Maurine Minister with his worker's compensation company and states that he would reach out to them himself at [phone], fax [phone] and does not want clinicals shared without his permission at this time.    Patient remains inpatient over the weekend to receive IV antibiotics and should discharge home with family when medically stable for discharge.  Instructions for follow up to schedule with a PCP - [facility] was placed in the AVS for patient to call and schedule a hospital follow up.    Expected Discharge Plan: Home/Self Care Barriers to Discharge: Continued Medical Work up   Patient Goals and CMS Choice Patient states their goals for this hospitalization and ongoing recovery are:: To get better and return home CMS Medicare.gov Compare Post Acute Care list provided to:: Patient Choice offered to / list presented to : Patient [place] ownership interest in [facility]provided to:: Patient    Expected Discharge Plan and Services   Discharge Planning Services: CM Consult   Living arrangements for the past 2 months: Single Family Home                                      Prior Living Arrangements/Services Living arrangements for the past 2 months: Single Family Home Lives with:: Parents Patient  language and need for interpreter reviewed:: Yes        Need for Family Participation in Patient Care: Yes (Comment) Care giver support system in place?: Yes (comment) Current home services: Other (comment) (None) Criminal Activity/Legal Involvement Pertinent to Current Situation/Hospitalization: No - Comment as needed  Activities of Daily Living   ADL Screening (condition at time of admission) Independently performs ADLs?: Yes (appropriate for developmental age) Does the patient have a NEW difficulty with bathing/dressing/toileting/self-feeding that is expected to last >3 days?: No Does the patient have a NEW difficulty with getting in/out of bed, walking, or climbing stairs that is expected to last >3 days?: No Does the patient have a NEW difficulty with communication that is expected to last >3 days?: No Is the patient deaf or have difficulty hearing?: No Does the patient have difficulty seeing, even when wearing glasses/contacts?: No Does the patient have difficulty concentrating, remembering, or making decisions?: No  Permission Sought/Granted Permission sought to share information with : Case Manager, Magazine features editor Permission granted to share information with : No (Patient does not want information shared with [employer] RNCM without his permission)              Emotional Assessment Appearance:: Appears stated age Attitude/Demeanor/Rapport: Gracious Affect (typically observed): Accepting Orientation: : Oriented to Self, Oriented to Place, Oriented to  Time, Oriented to Situation Alcohol / Substance Use: Not Applicable Psych Involvement: No (comment)  Admission diagnosis:  Cellulitis [L03.90] Sepsis, due to unspecified organism, unspecified whether acute organ dysfunction present [facility]) [A41.9] Patient Active Problem List   Diagnosis Date Noted   Sepsis due to cellulitis (HCC) 10/18/2022   AKI (acute kidney injury) (HCC) 10/18/2022   Hyponatremia  10/18/2022   PCP:  Pcp, No Pharmacy:   [id number] - [place], [place] - [address] AT [facility] OF [address] [place] [address] Phone: [phone] Fax: [phone]  [id number] [place], [place] - [address] AT [facility] OF [address] [place] [address] Phone: [phone] Fax: [phone]     Social Determinants of Health (SDOH) Social History: SDOH Screenings   Food Insecurity: No Food Insecurity (10/18/2022)  Housing: Patient Declined (10/18/2022)  Transportation Needs: No Transportation Needs (10/18/2022)  Utilities: Not At Risk (10/18/2022)  Tobacco Use: Low Risk  (10/18/2022)   SDOH Interventions:     Readmission Risk Interventions     No data to display

## 2022-10-20 NOTE — Plan of Care (Signed)
  Problem: Education: Goal: Knowledge of General Education information will improve Description: Including pain rating scale, medication(s)/side effects and non-pharmacologic comfort measures Outcome: Progressing   Problem: Health Behavior/Discharge Planning: Goal: Ability to manage health-related needs will improve Outcome: Progressing   Problem: Activity: Goal: Risk for activity intolerance will decrease Outcome: Progressing   Problem: Coping: Goal: Level of anxiety will decrease Outcome: Progressing   Problem: Safety: Goal: Ability to remain free from injury will improve Outcome: Progressing   Problem: Skin Integrity: Goal: Risk for impaired skin integrity will decrease Outcome: Progressing

## 2022-10-20 NOTE — Plan of Care (Signed)

## 2022-10-21 DIAGNOSIS — A419 Sepsis, unspecified organism: Secondary | ICD-10-CM | POA: Diagnosis not present

## 2022-10-21 DIAGNOSIS — L0390 Cellulitis, unspecified: Secondary | ICD-10-CM | POA: Diagnosis not present

## 2022-10-21 LAB — COMPREHENSIVE METABOLIC PANEL
ALT: 184 U/L — ABNORMAL HIGH (ref 0–44)
AST: 157 U/L — ABNORMAL HIGH (ref 15–41)
Albumin: 2 g/dL — ABNORMAL LOW (ref 3.5–5.0)
Alkaline Phosphatase: 105 U/L (ref 38–126)
Anion gap: 8 (ref 5–15)
BUN: 12 mg/dL (ref 6–20)
CO2: 24 mmol/L (ref 22–32)
Calcium: 7.6 mg/dL — ABNORMAL LOW (ref 8.9–10.3)
Chloride: 100 mmol/L (ref 98–111)
Creatinine, Ser: 1.23 mg/dL (ref 0.61–1.24)
GFR, Estimated: >60 mL/min (ref >60)
Glucose, Bld: 100 mg/dL — ABNORMAL HIGH (ref 70–99)
Potassium: 3.3 mmol/L — ABNORMAL LOW (ref 3.5–5.1)
Sodium: 132 mmol/L — ABNORMAL LOW (ref 135–145)
Total Bilirubin: 1.2 mg/dL (ref 0.3–1.2)
Total Protein: 5.1 g/dL — ABNORMAL LOW (ref 6.5–8.1)

## 2022-10-21 LAB — CBC WITH DIFFERENTIAL/PLATELET
Abs Immature Granulocytes: 0.7 10*3/uL — ABNORMAL HIGH (ref 0.00–0.07)
Basophils Absolute: 0.1 10*3/uL (ref 0.0–0.1)
Basophils Relative: 0 %
Eosinophils Absolute: 0.2 10*3/uL (ref 0.0–0.5)
Eosinophils Relative: 1 %
HCT: 31.8 % — ABNORMAL LOW (ref 39.0–52.0)
Hemoglobin: 11.6 g/dL — ABNORMAL LOW (ref 13.0–17.0)
Immature Granulocytes: 3 %
Lymphocytes Relative: 6 %
Lymphs Abs: 1.4 10*3/uL (ref 0.7–4.0)
MCH: 30.9 pg (ref 26.0–34.0)
MCHC: 36.5 g/dL — ABNORMAL HIGH (ref 30.0–36.0)
MCV: 84.8 fL (ref 80.0–100.0)
Monocytes Absolute: 1.7 10*3/uL — ABNORMAL HIGH (ref 0.1–1.0)
Monocytes Relative: 8 %
Neutro Abs: 17.6 10*3/uL — ABNORMAL HIGH (ref 1.7–7.7)
Neutrophils Relative %: 82 %
Platelets: 206 10*3/uL (ref 150–400)
RBC: 3.75 MIL/uL — ABNORMAL LOW (ref 4.22–5.81)
RDW: 12.4 % (ref 11.5–15.5)
WBC: 21.6 10*3/uL — ABNORMAL HIGH (ref 4.0–10.5)
nRBC: 0 % (ref 0.0–0.2)

## 2022-10-21 LAB — MAGNESIUM: Magnesium: 1.8 mg/dL (ref 1.7–2.4)

## 2022-10-21 LAB — CULTURE, BLOOD (ROUTINE X 2): Special Requests: ADEQUATE

## 2022-10-21 LAB — PHOSPHORUS: Phosphorus: 2.7 mg/dL (ref 2.5–4.6)

## 2022-10-21 NOTE — Plan of Care (Signed)

## 2022-10-21 NOTE — Progress Notes (Signed)
PROGRESS NOTE    Dean Nichols  WGN:[id number] DOB: 11/30/1995 DOA: 10/18/2022 PCP: Pcp, No  Chief Complaint  Patient presents with   Knee Pain    Brief Narrative:   Dean Nichols is Dean Nichols 27 y.o. 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  He was working in the yard when he accidentally cut his leg around the lateral aspect of the right knee with Dean Nichols metal pipe.  He presented to the ED from urgent care with fevers, chills, nausea, vomiting -> sepsis -> was admitted for sepsis due to right lower extremity cellulitis.  Assessment & Plan:   Principal Problem:   Sepsis due to cellulitis [facility]) Active Problems:   AKI (acute kidney injury) (HCC)   Hyponatremia  Sepsis due to Strep pyogenes Bacteremia Cellulitis of Right Lower Extremity  Met criteria for sepsis on presentation, continues to meet criteria for sepsis with tachycardia, fever, leukocytosis Worsening leukocytosis, LE edema maybe slightly worse - no fluctuance/crepitus appreciated Blood cultures with gram positive cocci (BCID with strep pyogenes) Procalcitonin 45 CRP 34.2 MRI 10/3 with severe subcutaneous edema surrounding the knee, greatest laterally - c/w cellulitis - nonspecific small knee joint effusion Appreciate ortho recs - no rec for arthrocentesis  Penicillin and linezolid  ID c/s -> appreciate recs. low threshold for surgical consultation Repeat cultures 10/4 AM - NG Echocardiogram EF 55-60%, no RWMA S/p Tdap 10/2 in ED  Elevated LFT's ? Shock liver RUQ [place] without concerning findings of liver and gallbladder Acute hepatitis panel  negative  Acute kidney Injury  Baseline creatinine unknown Suspect due to sepsis Follow renal US (normal), UA unrevealing    DVT prophylaxis: lovenox Code Status: full Family Communication: friend at bedside Disposition:   Status is:  Inpatient Remains inpatient appropriate because: need for continued inpatient care   Consultants:  orthopedics  Procedures:  none  Antimicrobials:  Anti-infectives (From admission, onward)    Start     Dose/Rate Route Frequency Ordered Stop   10/20/22 2200  linezolid (ZYVOX) tablet 600 mg        600 mg Oral Every 12 hours 10/20/22 1018     10/19/22 1300  penicillin G potassium 12 Million Units in dextrose 5 % 500 mL CONTINUOUS infusion        12 Million Units 41.7 mL/hr over 12 Hours Intravenous Every 12 hours 10/19/22 1213     10/19/22 1300  linezolid (ZYVOX) IVPB 600 mg  Status:  Discontinued        600 mg 300 mL/hr over 60 Minutes Intravenous Every 12 hours 10/19/22 1213 10/20/22 1018   10/19/22 0600  vancomycin (VANCOCIN) IVPB 1000 mg/200 mL premix  Status:  Discontinued        1,000 mg 200 mL/hr over 60 Minutes Intravenous Every 12 hours 10/18/22 1734 10/19/22 1213   10/19/22 0000  ceFEPIme (MAXIPIME) 2 g in sodium chloride 0.9 % 100 mL IVPB  Status:  Discontinued        2 g 200 mL/hr over 30 Minutes Intravenous Every 8 hours 10/18/22 1734 10/19/22 1213   10/18/22 2130  vancomycin (VANCOCIN) IVPB 1000 mg/200 mL premix  Status:  Discontinued        1,000 mg 200 mL/hr over 60 Minutes Intravenous  Once 10/18/22 2033 10/18/22 2047   10/18/22 1630  ceFEPIme (MAXIPIME) 2 g in sodium chloride 0.9 % 100 mL IVPB  2 g 200 mL/hr over 30 Minutes Intravenous  Once 10/18/22 1616 10/18/22 1820   10/18/22 1630  metroNIDAZOLE (FLAGYL) IVPB 500 mg        500 mg 100 mL/hr over 60 Minutes Intravenous  Once 10/18/22 1616 10/18/22 1819   10/18/22 1630  vancomycin (VANCOCIN) IVPB 1000 mg/200 mL premix        1,000 mg 200 mL/hr over 60 Minutes Intravenous  Once 10/18/22 1616 10/18/22 1924       Subjective: Feels things are about the same  Objective: Vitals:   10/20/22 1525 10/20/22 2100 10/21/22 0421 10/21/22 0811  BP: 111/69 (!) 118/56 111/61 113/61  Pulse:  99 92 88  Resp:  18  16 18   Temp:  99.1 F (37.3 C) 99 F (37.2 C) 99.4 F (37.4 C)  TempSrc:  Oral Oral   SpO2:  96% 98% 98%  Weight:      Height:        Intake/Output Summary (Last 24 hours) at 10/21/2022 1528 Last data filed at 10/21/2022 0700 Gross per 24 hour  Intake 3295.87 ml  Output --  Net 3295.87 ml   Filed Weights   10/18/22 1518 10/19/22 0350  Weight: 74.8 kg 75.1 kg    Examination:  General: No acute distress. Cardiovascular: RRR Lungs: unlabored Neurological: Alert and oriented 3. Moves all extremities 4. Cranial nerves II through XII grossly intact. Extremities: RLE with  slight worsening swelling - no crepitus or fluctuance, good ROM     Data Reviewed: I have personally reviewed following labs and imaging studies  CBC: Recent Labs  Lab 10/18/22 1527 10/18/22 2050 10/19/22 0633 10/20/22 0552 10/21/22 0551  WBC 17.9* 13.9* 11.9* 19.9* 21.6*  NEUTROABS 16.3*  --   --  18.5* 17.6*  HGB 16.2 13.8 12.4* 12.0* 11.6*  HCT 44.9 37.9* 34.6* 32.7* 31.8*  MCV 86.3 86.1 85.4 87.4 84.8  PLT 227 174 155 172 206    Basic Metabolic Panel: Recent Labs  Lab 10/18/22 1527 10/18/22 2050 10/19/22 0633 10/20/22 0552 10/21/22 0551  NA 131*  --  134* 130* 132*  K 3.7  --  3.7 3.5 3.3*  CL 93*  --  98 98 100  CO2 25  --  25 24 24   GLUCOSE 113*  --  113* 112* 100*  BUN 25*  --  17 14 12   CREATININE 1.50* 1.43* 1.47* 1.14 1.23  CALCIUM 9.5  --  8.0* 7.8* 7.6*  MG  --   --   --  1.8 1.8  PHOS  --   --   --  1.4* 2.7    GFR: Estimated Creatinine Clearance: 95 mL/min (by C-G formula based on SCr of 1.23 mg/dL).  Liver Function Tests: Recent Labs  Lab 10/18/22 1527 10/19/22 0633 10/20/22 0552 10/21/22 0551  AST 21 48* 406* 157*  ALT 19 36 271* 184*  ALKPHOS 70 54 75 105  BILITOT 1.2 1.1 1.1 1.2  PROT 8.5* 5.4* 5.1* 5.1*  ALBUMIN 4.6 2.5* 2.2* 2.0*    CBG: No results for input(s): "GLUCAP" in the last 168 hours.   Recent Results (from the past 240 hour(s))   Culture, blood (Routine x 2)     Status: Abnormal   Collection Time: 10/18/22  3:26 PM   Specimen: BLOOD LEFT ARM  Result Value Ref Range Status   Specimen Description   Final    BLOOD LEFT ARM Performed at [facility], [address], [place], [place] [address]  Special Requests   Final    BOTTLES DRAWN AEROBIC AND ANAEROBIC Blood Culture adequate volume Performed at [facility], [address], [place], [place] [address]    Culture  Setup Time   Final    GRAM POSITIVE COCCI IN CHAINS ANAEROBIC BOTTLE ONLY CRITICAL RESULT CALLED TO, READ BACK BY AND VERIFIED WITH: PHARMD BAILEY Ori Trejos 1153 FCP    Culture (Latayvia Mandujano)  Final    STREPTOCOCCUS PYOGENES HEALTH DEPARTMENT NOTIFIED Performed at [facility], [address], [place], [place] [address]    Report Status 10/21/2022 FINAL  Final   Organism ID, Bacteria STREPTOCOCCUS PYOGENES  Final      Susceptibility   Streptococcus pyogenes - MIC*    PENICILLIN <=0.06 SENSITIVE Sensitive     CEFTRIAXONE <=0.12 SENSITIVE Sensitive     ERYTHROMYCIN <=0.12 SENSITIVE Sensitive     LEVOFLOXACIN 0.5 SENSITIVE Sensitive     VANCOMYCIN 0.25 SENSITIVE Sensitive     * STREPTOCOCCUS PYOGENES  Blood Culture ID Panel (Reflexed)     Status: Abnormal   Collection Time: 10/18/22  3:26 PM  Result Value Ref Range Status   Enterococcus faecalis NOT DETECTED NOT DETECTED Final   Enterococcus Faecium NOT DETECTED NOT DETECTED Final   Listeria monocytogenes NOT DETECTED NOT DETECTED Final   Staphylococcus species NOT DETECTED NOT DETECTED Final   Staphylococcus aureus (BCID) NOT DETECTED NOT DETECTED Final   Staphylococcus epidermidis NOT DETECTED NOT DETECTED Final   Staphylococcus lugdunensis NOT DETECTED NOT DETECTED Final   Streptococcus species DETECTED (Tajanay Hurley) NOT DETECTED Final    Comment: CRITICAL RESULT CALLED TO, READ BACK BY AND VERIFIED WITH: PHARMD BAILEY Emmaly Leech 1153 FCP    Streptococcus agalactiae NOT DETECTED NOT  DETECTED Final   Streptococcus pneumoniae NOT DETECTED NOT DETECTED Final   Streptococcus pyogenes DETECTED (Arra Connaughton) NOT DETECTED Final    Comment: CRITICAL RESULT CALLED TO, READ BACK BY AND VERIFIED WITH: PHARMD BAILEY Romelle Reiley 1153 FCP    Evann Koelzer.calcoaceticus-baumannii NOT DETECTED NOT DETECTED Final   Bacteroides fragilis NOT DETECTED NOT DETECTED Final   Enterobacterales NOT DETECTED NOT DETECTED Final   Enterobacter cloacae complex NOT DETECTED NOT DETECTED Final   Escherichia coli NOT DETECTED NOT DETECTED Final   Klebsiella aerogenes NOT DETECTED NOT DETECTED Final   Klebsiella oxytoca NOT DETECTED NOT DETECTED Final   Klebsiella pneumoniae NOT DETECTED NOT DETECTED Final   Proteus species NOT DETECTED NOT DETECTED Final   Salmonella species NOT DETECTED NOT DETECTED Final   Serratia marcescens NOT DETECTED NOT DETECTED Final   Haemophilus influenzae NOT DETECTED NOT DETECTED Final   Neisseria meningitidis NOT DETECTED NOT DETECTED Final   Pseudomonas aeruginosa NOT DETECTED NOT DETECTED Final   Stenotrophomonas maltophilia NOT DETECTED NOT DETECTED Final   Candida albicans NOT DETECTED NOT DETECTED Final   Candida auris NOT DETECTED NOT DETECTED Final   Candida glabrata NOT DETECTED NOT DETECTED Final   Candida krusei NOT DETECTED NOT DETECTED Final   Candida parapsilosis NOT DETECTED NOT DETECTED Final   Candida tropicalis NOT DETECTED NOT DETECTED Final   Cryptococcus neoformans/gattii NOT DETECTED NOT DETECTED Final    Comment: Performed at [facility], [address] [place], [place] [address]  Culture, blood (Routine x 2)     Status: None (Preliminary result)   Collection Time: 10/18/22  3:31 PM   Specimen: BLOOD  Result Value Ref Range Status   Specimen Description   Final    BLOOD LEFT ANTECUBITAL Performed at [facility], [address] [place], [place] [address]    Special Requests   Final    BOTTLES DRAWN AEROBIC AND ANAEROBIC Blood Culture  adequate volume Performed at [facility], [address] [place], [place] [address]    Culture   Final    NO GROWTH 3 DAYS Performed at [facility], [address], [place], [place] [address]    Report Status PENDING  Incomplete  Culture, blood (Routine X 2) w Reflex to ID Panel     Status: None (Preliminary result)   Collection Time: 10/20/22  5:52 AM   Specimen: BLOOD  Result Value Ref Range Status   Specimen Description BLOOD BLOOD RIGHT ARM  Final   Special Requests   Final    BOTTLES DRAWN AEROBIC AND ANAEROBIC Blood Culture adequate volume   Culture   Final    NO GROWTH < 24 HOURS Performed at [facility], [address], [place], [place] [address]    Report Status PENDING  Incomplete  Culture, blood (Routine X 2) w Reflex to ID Panel     Status: None (Preliminary result)   Collection Time: 10/20/22  5:57 AM   Specimen: BLOOD  Result Value Ref Range Status   Specimen Description BLOOD BLOOD RIGHT ARM  Final   Special Requests   Final    BOTTLES DRAWN AEROBIC AND ANAEROBIC Blood Culture adequate volume   Culture   Final    NO GROWTH < 24 HOURS Performed at [facility], [address], [place], [place] [address]    Report Status PENDING  Incomplete         Radiology Studies: US Abdomen Limited RUQ (LIVER/GB)  Result Date: 10/20/2022 CLINICAL DATA:  Elevated LFTs. EXAM: ULTRASOUND ABDOMEN LIMITED RIGHT UPPER QUADRANT COMPARISON:  None Available. FINDINGS: Gallbladder: No gallstones or wall thickening visualized. No sonographic Murphy sign noted by sonographer. The gallbladder is somewhat contracted. Maximal wall thickness is 2.4 mm. Common bile duct: Diameter: 3.6 mm, within normal limits. Liver: No focal lesion identified. Within normal limits in parenchymal echogenicity. Portal vein is patent on color Doppler imaging with normal direction of blood flow towards the liver. Other: The right kidney is incidentally imaged. The  parenchyma is hyperechoic relative to the index organ, the liver. No discrete lesions or obstruction are present. IMPRESSION: 1. Normal sonographic appearance of the liver and gallbladder. 2. Hyperechoic appearance of the right kidney. This is Imoni Kohen nonspecific finding, but can be seen in the setting of medical renal disease. Electronically Signed   By: Marin Roberts M.D.   On: 10/20/2022 19:03   ECHOCARDIOGRAM COMPLETE  Result Date: 10/20/2022    ECHOCARDIOGRAM REPORT   Patient Name:   BASTIEN STRAWSER Date of Exam: 10/19/2022 Medical Rec #:  [id number]     Height:       72.0 in Accession #:    [id number]    Weight:       165.6 lb Date of Birth:  1995/05/30      BSA:          1.966 m Patient Age:    27 years      BP:           106/52 mmHg Patient Gender: M             HR:           96 bpm. Exam Location:  Inpatient Procedure: 2D Echo, Cardiac Doppler and Color Doppler Indications:  Bacteremia R78.81  History:        Patient has no prior history of Echocardiogram examinations.  Sonographer:    Lucendia Herrlich RCS Referring Phys: [phone] Idabell Picking CALDWELL POWELL JR IMPRESSIONS  1. Left ventricular ejection fraction, by estimation, is 55 to 60%. The left ventricle has normal function. The left ventricle has no regional wall motion abnormalities. Left ventricular diastolic parameters were normal.  2. Right ventricular systolic function is normal. The right ventricular size is normal. There is normal pulmonary artery systolic pressure. The estimated right ventricular systolic pressure is 25.1 mmHg.  3. The mitral valve is normal in structure. No evidence of mitral valve regurgitation. No evidence of mitral stenosis.  4. The aortic valve is tricuspid. Aortic valve regurgitation is not visualized. No aortic stenosis is present.  5. The inferior vena cava is dilated in size with >50% respiratory variability, suggesting right atrial pressure of 8 mmHg.  6. No valvular vegetation noted. FINDINGS  Left Ventricle: Left ventricular  ejection fraction, by estimation, is 55 to 60%. The left ventricle has normal function. The left ventricle has no regional wall motion abnormalities. The left ventricular internal cavity size was normal in size. There is  no left ventricular hypertrophy. Left ventricular diastolic parameters were normal. Right Ventricle: The right ventricular size is normal. No increase in right ventricular wall thickness. Right ventricular systolic function is normal. There is normal pulmonary artery systolic pressure. The tricuspid regurgitant velocity is 2.07 m/s, and  with an assumed right atrial pressure of 8 mmHg, the estimated right ventricular systolic pressure is 25.1 mmHg. Left Atrium: Left atrial size was normal in size. Right Atrium: Right atrial size was normal in size. Pericardium: Trivial pericardial effusion is present. Mitral Valve: The mitral valve is normal in structure. No evidence of mitral valve regurgitation. No evidence of mitral valve stenosis. Tricuspid Valve: The tricuspid valve is normal in structure. Tricuspid valve regurgitation is trivial. Aortic Valve: The aortic valve is tricuspid. Aortic valve regurgitation is not visualized. No aortic stenosis is present. Aortic valve peak gradient measures 5.6 mmHg. Pulmonic Valve: The pulmonic valve was normal in structure. Pulmonic valve regurgitation is trivial. Aorta: The aortic root is normal in size and structure. Venous: The inferior vena cava is dilated in size with greater than 50% respiratory variability, suggesting right atrial pressure of 8 mmHg. IAS/Shunts: No atrial level shunt detected by color flow Doppler.  LEFT VENTRICLE PLAX 2D LVIDd:         5.30 cm   Diastology LVIDs:         3.40 cm   LV e' medial:    10.60 cm/s LV PW:         1.00 cm   LV E/e' medial:  8.0 LV IVS:        0.70 cm   LV e' lateral:   16.60 cm/s LVOT diam:     2.40 cm   LV E/e' lateral: 5.1 LV SV:         73 LV SV Index:   37 LVOT Area:     4.52 cm  RIGHT VENTRICLE              IVC RV S prime:     19.20 cm/s  IVC diam: 2.30 cm TAPSE (M-mode): 2.4 cm LEFT ATRIUM             Index        RIGHT ATRIUM           Index LA  diam:        3.80 cm 1.93 cm/m   RA Area:     15.50 cm LA Vol (A2C):   57.9 ml 29.45 ml/m  RA Volume:   40.10 ml  20.40 ml/m LA Vol (A4C):   47.5 ml 24.16 ml/m LA Biplane Vol: 54.6 ml 27.77 ml/m  AORTIC VALVE                 PULMONIC VALVE AV Area (Vmax): 3.66 cm     PR End Diast Vel: 2.03 msec AV Vmax:        118.00 cm/s AV Peak Grad:   5.6 mmHg LVOT Vmax:      95.50 cm/s LVOT Vmean:     62.400 cm/s LVOT VTI:       0.161 m  AORTA Ao Root diam: 3.30 cm Ao Asc diam:  3.00 cm MITRAL VALVE               TRICUSPID VALVE MV Area (PHT): 4.63 cm    TR Peak grad:   17.1 mmHg MV Decel Time: 164 msec    TR Vmax:        207.00 cm/s MV E velocity: 84.40 cm/s MV Rand Boller velocity: 51.40 cm/s  SHUNTS MV E/Jaxon Mynhier ratio:  1.64        Systemic VTI:  0.16 m                            Systemic Diam: 2.40 cm Dalton McleanMD Electronically signed by Wilfred Lacy Signature Date/Time: 10/20/2022/8:33:34 AM    Final    US RENAL  Result Date: 10/19/2022 CLINICAL DATA:  Acute kidney injury EXAM: RENAL / URINARY TRACT ULTRASOUND COMPLETE COMPARISON:  None Available. FINDINGS: Right Kidney: Renal measurements: 13 x 4.2 x 5.5 cm = volume: 157 mL. Echogenicity within normal limits. No mass or hydronephrosis visualized. Left Kidney: Renal measurements: 10.6 x 5.9 x 5.3 cm = volume: 174 mL. Echogenicity within normal limits. No mass or hydronephrosis visualized. Bladder: Incompletely distended Other: None. IMPRESSION: Normal renal ultrasound. Electronically Signed   By: Corlis Leak M.D.   On: 10/19/2022 21:18        Scheduled Meds:  enoxaparin (LOVENOX) injection  40 mg Subcutaneous Q24H   linezolid  600 mg Oral Q12H   phosphorus  500 mg Oral QID   Continuous Infusions:  penicillin G potassium 12 Million Units in dextrose 5 % 500 mL CONTINUOUS infusion 12 Million Units (10/21/22 0504)      LOS: 3 days    Time spent: over 30 min 40 min critical care time with sepsis    Lacretia Nicks, MD [facility]   To contact the attending provider between 7A-7P or the covering provider during after hours 7P-7A, please log into the web site [email or web address] and access using universal [place] password for that web site. If you do not have the password, please call the hospital operator.  10/21/2022, 3:28 PM

## 2022-10-21 NOTE — Progress Notes (Signed)
[facility]  Date of Admission:  10/18/2022      Total days of antibiotics 3  Penicillin 10/03 >>  Linezolid 10/03 >>   Vanc/Cefepime 10/02 >> 10/03            ASSESSMENT: Dean Nichols is a 27 y.o. male otherwise healthy presenting with acute abrupt illness after laceration of right knee Monday 10/16/22 - found to have:      Group A Streptococcus Bacteremia -  Right LE cellulitis  10/21/22 assessment he had septic shock and required fluid resuscitation now with slight worsening rle swelling and slight bump wbc would keep close eyes monitoring for sign of nec fasc which at this time I don't see obvious Transaminitis/shock liver is improving; acute hepatitis panel negative Hiv screen negative No sign of TSS. Fever had resolved since admission    -Continue IV penicillin infusion  -Continue Linezolid for now. If clinical improvement in the next 2 days can stop -of note, if the wbc continued increasing and any sign of proximal pain migration I would involve surgery sooner than later in setting potential group a associated nec fasc. He doesn't have any significant pain/tenderness worse from yesterday or crepitus/bullae (which are not as sensitive) and he seems clinically well/stable today as of noon      Principal Problem:   Sepsis due to cellulitis [facility]) Active Problems:   AKI (acute kidney injury) (HCC)   Hyponatremia    enoxaparin (LOVENOX) injection  40 mg Subcutaneous Q24H   linezolid  600 mg Oral Q12H   phosphorus  500 mg Oral QID    SUBJECTIVE: Afebrile Slight bump wbc count Pain stable, slight increased swelling and he hasn't elevated his leg    Review of Systems: All other ros negative  No Known Allergies  OBJECTIVE: Vitals:   10/20/22 1525 10/20/22 2100 10/21/22 0421 10/21/22 0811  BP: 111/69 (!) 118/56 111/61 113/61  Pulse:  99 92 88  Resp:  18 16 18   Temp:  99.1 F (37.3 C) 99 F (37.2 C) 99.4 F (37.4 C)   TempSrc:  Oral Oral   SpO2:  96% 98% 98%  Weight:      Height:       Body mass index is 22.45 kg/m.  Physical exam: General/constitutional: no distress, pleasant; gir;lfriend by bedside HEENT: Normocephalic, PER, Conj Clear, EOMI, Oropharynx clear Neck supple CV: rrr no mrg Lungs: clear to auscultation, normal respiratory effort Abd: Soft, Nontender Neuro: nonfocal MSK/skin; right LE swelling with scabs/eschar anterior tibia; mildly tender no crepitus, slightly erythamatous hue extending from foot to above knee; no bullae, induration; yellowish blister posterior distal thigh    Lab Results Lab Results  Component Value Date   WBC 21.6 (H) 10/21/2022   HGB 11.6 (L) 10/21/2022   HCT 31.8 (L) 10/21/2022   MCV 84.8 10/21/2022   PLT 206 10/21/2022    Lab Results  Component Value Date   CREATININE 1.23 10/21/2022   BUN 12 10/21/2022   NA 132 (L) 10/21/2022   K 3.3 (L) 10/21/2022   CL 100 10/21/2022   CO2 24 10/21/2022    Lab Results  Component Value Date   ALT 184 (H) 10/21/2022   AST 157 (H) 10/21/2022   ALKPHOS 105 10/21/2022   BILITOT 1.2 10/21/2022     Microbiology: Recent Results (from the past 240 hour(s))  Culture, blood (Routine x 2)     Status: Abnormal   Collection Time:  10/18/22  3:26 PM   Specimen: BLOOD LEFT ARM  Result Value Ref Range Status   Specimen Description   Final    BLOOD LEFT ARM Performed at [facility], [address] [place], [place] [address]    Special Requests   Final    BOTTLES DRAWN AEROBIC AND ANAEROBIC Blood Culture adequate volume Performed at [facility], [address], [place], [place] [address]    Culture  Setup Time   Final    GRAM POSITIVE COCCI IN CHAINS ANAEROBIC BOTTLE ONLY CRITICAL RESULT CALLED TO, READ BACK BY AND VERIFIED WITH: PHARMD BAILEY A 1153 FCP    Culture (A)  Final    STREPTOCOCCUS PYOGENES HEALTH DEPARTMENT NOTIFIED Performed at [facility], [address], [place], [place] [address]    Report Status 10/21/2022 FINAL  Final   Organism ID, Bacteria STREPTOCOCCUS PYOGENES  Final      Susceptibility   Streptococcus pyogenes - MIC*    PENICILLIN <=0.06 SENSITIVE Sensitive     CEFTRIAXONE <=0.12 SENSITIVE Sensitive     ERYTHROMYCIN <=0.12 SENSITIVE Sensitive     LEVOFLOXACIN 0.5 SENSITIVE Sensitive     VANCOMYCIN 0.25 SENSITIVE Sensitive     * STREPTOCOCCUS PYOGENES  Blood Culture ID Panel (Reflexed)     Status: Abnormal   Collection Time: 10/18/22  3:26 PM  Result Value Ref Range Status   Enterococcus faecalis NOT DETECTED NOT DETECTED Final   Enterococcus Faecium NOT DETECTED NOT DETECTED Final   Listeria monocytogenes NOT DETECTED NOT DETECTED Final   Staphylococcus species NOT DETECTED NOT DETECTED Final   Staphylococcus aureus (BCID) NOT DETECTED NOT DETECTED Final   Staphylococcus epidermidis NOT DETECTED NOT DETECTED Final   Staphylococcus lugdunensis NOT DETECTED NOT DETECTED Final   Streptococcus species DETECTED (A) NOT DETECTED Final    Comment: CRITICAL RESULT CALLED TO, READ BACK BY AND VERIFIED WITH: PHARMD BAILEY A 1153 FCP    Streptococcus agalactiae NOT DETECTED NOT DETECTED Final   Streptococcus pneumoniae NOT DETECTED NOT DETECTED Final   Streptococcus pyogenes DETECTED (A) NOT DETECTED Final    Comment: CRITICAL RESULT CALLED TO, READ BACK BY AND VERIFIED WITH: PHARMD BAILEY A 1153 FCP    A.calcoaceticus-baumannii NOT DETECTED NOT DETECTED Final   Bacteroides fragilis NOT DETECTED NOT DETECTED Final   Enterobacterales NOT DETECTED NOT DETECTED Final   Enterobacter cloacae complex NOT DETECTED NOT DETECTED Final   Escherichia coli NOT DETECTED NOT DETECTED Final   Klebsiella aerogenes NOT DETECTED NOT DETECTED Final   Klebsiella oxytoca NOT DETECTED NOT DETECTED Final   Klebsiella pneumoniae NOT DETECTED NOT DETECTED Final   Proteus species NOT DETECTED NOT DETECTED Final   Salmonella species NOT DETECTED NOT  DETECTED Final   Serratia marcescens NOT DETECTED NOT DETECTED Final   Haemophilus influenzae NOT DETECTED NOT DETECTED Final   Neisseria meningitidis NOT DETECTED NOT DETECTED Final   Pseudomonas aeruginosa NOT DETECTED NOT DETECTED Final   Stenotrophomonas maltophilia NOT DETECTED NOT DETECTED Final   Candida albicans NOT DETECTED NOT DETECTED Final   Candida auris NOT DETECTED NOT DETECTED Final   Candida glabrata NOT DETECTED NOT DETECTED Final   Candida krusei NOT DETECTED NOT DETECTED Final   Candida parapsilosis NOT DETECTED NOT DETECTED Final   Candida tropicalis NOT DETECTED NOT DETECTED Final   Cryptococcus neoformans/gattii NOT DETECTED NOT DETECTED Final    Comment: Performed at [facility], [address] [place], [place] [address]  Culture, blood (Routine  x 2)     Status: None (Preliminary result)   Collection Time: 10/18/22  3:31 PM   Specimen: BLOOD  Result Value Ref Range Status   Specimen Description   Final    BLOOD LEFT ANTECUBITAL Performed at [facility], [address], [place], [place] [address]    Special Requests   Final    BOTTLES DRAWN AEROBIC AND ANAEROBIC Blood Culture adequate volume Performed at [facility], [address], [place], [place] [address]    Culture   Final    NO GROWTH 3 DAYS Performed at [facility], [address], [place], [place] [address]    Report Status PENDING  Incomplete  Culture, blood (Routine X 2) w Reflex to ID Panel     Status: None (Preliminary result)   Collection Time: 10/20/22  5:52 AM   Specimen: BLOOD  Result Value Ref Range Status   Specimen Description BLOOD BLOOD RIGHT ARM  Final   Special Requests   Final    BOTTLES DRAWN AEROBIC AND ANAEROBIC Blood Culture adequate volume   Culture   Final    NO GROWTH < 24 HOURS Performed at [facility], [address] [place], [place] [address]    Report Status PENDING  Incomplete  Culture, blood (Routine  X 2) w Reflex to ID Panel     Status: None (Preliminary result)   Collection Time: 10/20/22  5:57 AM   Specimen: BLOOD  Result Value Ref Range Status   Specimen Description BLOOD BLOOD RIGHT ARM  Final   Special Requests   Final    BOTTLES DRAWN AEROBIC AND ANAEROBIC Blood Culture adequate volume   Culture   Final    NO GROWTH < 24 HOURS Performed at [facility], [address], [place], [place] [address]    Report Status PENDING  Incomplete          Raymondo Band, MD [facility] [phone]  pager   [phone] cell 10/21/2022, 2:44 PM

## 2022-10-22 ENCOUNTER — Encounter (HOSPITAL_COMMUNITY)
Admission: EM | Disposition: A | Discharge status: Home Health | Payer: Self-pay | Source: Home / Self Care | Attending: Family Medicine

## 2022-10-22 ENCOUNTER — Encounter (HOSPITAL_COMMUNITY): Payer: Self-pay | Admitting: Obstetrics and Gynecology

## 2022-10-22 ENCOUNTER — Inpatient Hospital Stay (HOSPITAL_COMMUNITY): Payer: Commercial Managed Care - HMO | Admitting: Anesthesiology

## 2022-10-22 ENCOUNTER — Inpatient Hospital Stay (HOSPITAL_COMMUNITY): Payer: Commercial Managed Care - HMO

## 2022-10-22 DIAGNOSIS — L03116 Cellulitis of left lower limb: Secondary | ICD-10-CM | POA: Diagnosis not present

## 2022-10-22 DIAGNOSIS — R600 Localized edema: Secondary | ICD-10-CM | POA: Diagnosis not present

## 2022-10-22 DIAGNOSIS — M7989 Other specified soft tissue disorders: Secondary | ICD-10-CM | POA: Diagnosis not present

## 2022-10-22 DIAGNOSIS — L0390 Cellulitis, unspecified: Secondary | ICD-10-CM | POA: Diagnosis not present

## 2022-10-22 DIAGNOSIS — A419 Sepsis, unspecified organism: Secondary | ICD-10-CM | POA: Diagnosis not present

## 2022-10-22 DIAGNOSIS — M726 Necrotizing fasciitis: Secondary | ICD-10-CM | POA: Diagnosis not present

## 2022-10-22 HISTORY — PX: APPLICATION OF WOUND VAC: SHX5189

## 2022-10-22 HISTORY — PX: I & D EXTREMITY: SHX5045

## 2022-10-22 LAB — CBC WITH DIFFERENTIAL/PLATELET
Abs Immature Granulocytes: 0.33 10*3/uL — ABNORMAL HIGH (ref 0.00–0.07)
Basophils Absolute: 0 10*3/uL (ref 0.0–0.1)
Basophils Relative: 0 %
Eosinophils Absolute: 0.3 10*3/uL (ref 0.0–0.5)
Eosinophils Relative: 2 %
HCT: 32.4 % — ABNORMAL LOW (ref 39.0–52.0)
Hemoglobin: 11.8 g/dL — ABNORMAL LOW (ref 13.0–17.0)
Immature Granulocytes: 2 %
Lymphocytes Relative: 12 %
Lymphs Abs: 2.1 10*3/uL (ref 0.7–4.0)
MCH: 30.8 pg (ref 26.0–34.0)
MCHC: 36.4 g/dL — ABNORMAL HIGH (ref 30.0–36.0)
MCV: 84.6 fL (ref 80.0–100.0)
Monocytes Absolute: 2.2 10*3/uL — ABNORMAL HIGH (ref 0.1–1.0)
Monocytes Relative: 13 %
Neutro Abs: 12.2 10*3/uL — ABNORMAL HIGH (ref 1.7–7.7)
Neutrophils Relative %: 71 %
Platelets: 231 10*3/uL (ref 150–400)
RBC: 3.83 MIL/uL — ABNORMAL LOW (ref 4.22–5.81)
RDW: 12.6 % (ref 11.5–15.5)
WBC: 17.2 10*3/uL — ABNORMAL HIGH (ref 4.0–10.5)
nRBC: 0 % (ref 0.0–0.2)

## 2022-10-22 LAB — COMPREHENSIVE METABOLIC PANEL
ALT: 182 U/L — ABNORMAL HIGH (ref 0–44)
AST: 135 U/L — ABNORMAL HIGH (ref 15–41)
Albumin: 2 g/dL — ABNORMAL LOW (ref 3.5–5.0)
Alkaline Phosphatase: 183 U/L — ABNORMAL HIGH (ref 38–126)
Anion gap: 9 (ref 5–15)
BUN: 10 mg/dL (ref 6–20)
CO2: 25 mmol/L (ref 22–32)
Calcium: 7.6 mg/dL — ABNORMAL LOW (ref 8.9–10.3)
Chloride: 101 mmol/L (ref 98–111)
Creatinine, Ser: 1.11 mg/dL (ref 0.61–1.24)
GFR, Estimated: >60 mL/min (ref >60)
Glucose, Bld: 100 mg/dL — ABNORMAL HIGH (ref 70–99)
Potassium: 3.4 mmol/L — ABNORMAL LOW (ref 3.5–5.1)
Sodium: 135 mmol/L (ref 135–145)
Total Bilirubin: 0.9 mg/dL (ref 0.3–1.2)
Total Protein: 5.3 g/dL — ABNORMAL LOW (ref 6.5–8.1)

## 2022-10-22 LAB — PHOSPHORUS: Phosphorus: 3.4 mg/dL (ref 2.5–4.6)

## 2022-10-22 LAB — MAGNESIUM: Magnesium: 1.9 mg/dL (ref 1.7–2.4)

## 2022-10-22 SURGERY — IRRIGATION AND DEBRIDEMENT EXTREMITY
Anesthesia: General | Site: Leg Upper | Laterality: Right

## 2022-10-22 MED ORDER — ESMOLOL HCL 100 MG/10ML IV SOLN
INTRAVENOUS | Status: AC
  Filled 2022-10-22: qty 10

## 2022-10-22 MED ORDER — PHENYLEPHRINE 80 MCG/ML (10ML) SYRINGE FOR IV PUSH (FOR BLOOD PRESSURE SUPPORT)
PREFILLED_SYRINGE | INTRAVENOUS | Status: DC | PRN
  Administered 2022-10-22 (×4): 80 ug via INTRAVENOUS

## 2022-10-22 MED ORDER — DEXAMETHASONE SODIUM PHOSPHATE 10 MG/ML IJ SOLN
INTRAMUSCULAR | Status: AC
  Filled 2022-10-22: qty 2

## 2022-10-22 MED ORDER — ACETAMINOPHEN 10 MG/ML IV SOLN
INTRAVENOUS | Status: AC
  Filled 2022-10-22: qty 100

## 2022-10-22 MED ORDER — METHOCARBAMOL 1000 MG/10ML IJ SOLN: 500.0000 mg | Freq: Four times a day (QID) | INTRAVENOUS | Status: DC | PRN

## 2022-10-22 MED ORDER — DEXMEDETOMIDINE HCL IN NACL 80 MCG/20ML IV SOLN
INTRAVENOUS | Status: DC | PRN
Start: 2022-10-22 — End: 2022-10-22
  Administered 2022-10-22 (×2): 8 ug via INTRAVENOUS

## 2022-10-22 MED ORDER — POLYETHYLENE GLYCOL 3350 17 G PO PACK: 17.0000 g | PACK | Freq: Every day | ORAL | Status: DC | PRN

## 2022-10-22 MED ORDER — DIPHENHYDRAMINE HCL 25 MG PO CAPS: 25.0000 mg | ORAL_CAPSULE | Freq: Four times a day (QID) | ORAL | Status: DC | PRN

## 2022-10-22 MED ORDER — ACETAMINOPHEN 10 MG/ML IV SOLN
1000.0000 mg | Freq: Once | INTRAVENOUS | Status: DC | PRN
  Administered 2022-10-22: 1000 mg via INTRAVENOUS

## 2022-10-22 MED ORDER — MAGNESIUM CITRATE PO SOLN: 1.0000 | Freq: Once | ORAL | Status: DC | PRN

## 2022-10-22 MED ORDER — ORAL CARE MOUTH RINSE: 15.0000 mL | Freq: Once | OROMUCOSAL | Status: AC

## 2022-10-22 MED ORDER — SODIUM CHLORIDE 0.9 % IR SOLN
Status: DC | PRN
  Administered 2022-10-22: 3000 mL

## 2022-10-22 MED ORDER — OXYCODONE HCL 5 MG PO TABS: 5.0000 mg | ORAL_TABLET | Freq: Once | ORAL | Status: DC | PRN

## 2022-10-22 MED ORDER — ONDANSETRON HCL 4 MG/2ML IJ SOLN
INTRAMUSCULAR | Status: AC
  Filled 2022-10-22: qty 4

## 2022-10-22 MED ORDER — MIDAZOLAM HCL 2 MG/2ML IJ SOLN
INTRAMUSCULAR | Status: DC | PRN
  Administered 2022-10-22: 2 mg via INTRAVENOUS

## 2022-10-22 MED ORDER — FENTANYL CITRATE (PF) 100 MCG/2ML IJ SOLN
25.0000 ug | INTRAMUSCULAR | Status: DC | PRN
  Administered 2022-10-22: 50 ug via INTRAVENOUS
  Administered 2022-10-22 (×2): 25 ug via INTRAVENOUS

## 2022-10-22 MED ORDER — PROPOFOL 10 MG/ML IV BOLUS
INTRAVENOUS | Status: DC | PRN
  Administered 2022-10-22: 200 mg via INTRAVENOUS

## 2022-10-22 MED ORDER — IOHEXOL 350 MG/ML SOLN
75.0000 mL | Freq: Once | INTRAVENOUS | Status: AC | PRN
  Administered 2022-10-22: 75 mL via INTRAVENOUS

## 2022-10-22 MED ORDER — LIDOCAINE 2% (20 MG/ML) 5 ML SYRINGE
INTRAMUSCULAR | Status: DC | PRN
  Administered 2022-10-22: 60 mg via INTRAVENOUS

## 2022-10-22 MED ORDER — LACTATED RINGERS IV SOLN: INTRAVENOUS | Status: DC

## 2022-10-22 MED ORDER — FENTANYL CITRATE (PF) 100 MCG/2ML IJ SOLN
INTRAMUSCULAR | Status: AC
  Filled 2022-10-22: qty 2

## 2022-10-22 MED ORDER — ORAL CARE MOUTH RINSE: 15.0000 mL | Freq: Once | OROMUCOSAL | Status: DC

## 2022-10-22 MED ORDER — 0.9 % SODIUM CHLORIDE (POUR BTL) OPTIME
TOPICAL | Status: DC | PRN
Start: 2022-10-22 — End: 2022-10-22
  Administered 2022-10-22: 1000 mL

## 2022-10-22 MED ORDER — CHLORHEXIDINE GLUCONATE 0.12 % MT SOLN
OROMUCOSAL | Status: AC
  Administered 2022-10-22: 15 mL via OROMUCOSAL
  Filled 2022-10-22: qty 15

## 2022-10-22 MED ORDER — SUGAMMADEX SODIUM 200 MG/2ML IV SOLN
INTRAVENOUS | Status: DC | PRN
  Administered 2022-10-22: 200 mg via INTRAVENOUS

## 2022-10-22 MED ORDER — OXYCODONE HCL 5 MG PO TABS
5.0000 mg | ORAL_TABLET | ORAL | Status: DC | PRN
  Administered 2022-10-23: 10 mg via ORAL
  Filled 2022-10-22 (×2): qty 2

## 2022-10-22 MED ORDER — ROCURONIUM BROMIDE 10 MG/ML (PF) SYRINGE
PREFILLED_SYRINGE | INTRAVENOUS | Status: AC
  Filled 2022-10-22: qty 20

## 2022-10-22 MED ORDER — LACTATED RINGERS IV SOLN: INTRAVENOUS | Status: DC | PRN

## 2022-10-22 MED ORDER — LIDOCAINE 2% (20 MG/ML) 5 ML SYRINGE
INTRAMUSCULAR | Status: AC
  Filled 2022-10-22: qty 10

## 2022-10-22 MED ORDER — MORPHINE SULFATE (PF) 2 MG/ML IV SOLN: 2.0000 mg | INTRAVENOUS | Status: DC | PRN

## 2022-10-22 MED ORDER — POTASSIUM CHLORIDE CRYS ER 20 MEQ PO TBCR
40.0000 meq | EXTENDED_RELEASE_TABLET | ORAL | Status: AC
  Administered 2022-10-22 (×2): 40 meq via ORAL
  Filled 2022-10-22 (×2): qty 2

## 2022-10-22 MED ORDER — VASHE WOUND IRRIGATION OPTIME
TOPICAL | Status: DC | PRN
  Administered 2022-10-22: 34 [oz_av]

## 2022-10-22 MED ORDER — METHOCARBAMOL 500 MG PO TABS
500.0000 mg | ORAL_TABLET | Freq: Four times a day (QID) | ORAL | Status: DC | PRN
  Administered 2022-10-23: 500 mg via ORAL
  Filled 2022-10-22: qty 1

## 2022-10-22 MED ORDER — BISACODYL 10 MG RE SUPP: 10.0000 mg | Freq: Every day | RECTAL | Status: DC | PRN

## 2022-10-22 MED ORDER — SODIUM CHLORIDE 0.9 % IV SOLN: INTRAVENOUS | Status: DC

## 2022-10-22 MED ORDER — MIDAZOLAM HCL 2 MG/2ML IJ SOLN
INTRAMUSCULAR | Status: AC
  Filled 2022-10-22: qty 2

## 2022-10-22 MED ORDER — AMISULPRIDE (ANTIEMETIC) 5 MG/2ML IV SOLN: 10.0000 mg | Freq: Once | INTRAVENOUS | Status: DC | PRN

## 2022-10-22 MED ORDER — PROPOFOL 10 MG/ML IV BOLUS
INTRAVENOUS | Status: AC
  Filled 2022-10-22: qty 20

## 2022-10-22 MED ORDER — CHLORHEXIDINE GLUCONATE 0.12 % MT SOLN: 15.0000 mL | Freq: Once | OROMUCOSAL | Status: DC

## 2022-10-22 MED ORDER — FENTANYL CITRATE (PF) 250 MCG/5ML IJ SOLN
INTRAMUSCULAR | Status: AC
  Filled 2022-10-22: qty 5

## 2022-10-22 MED ORDER — DOCUSATE SODIUM 100 MG PO CAPS
100.0000 mg | ORAL_CAPSULE | Freq: Two times a day (BID) | ORAL | Status: DC
  Administered 2022-10-23 – 2022-10-29 (×5): 100 mg via ORAL
  Filled 2022-10-22 (×15): qty 1

## 2022-10-22 MED ORDER — ROCURONIUM BROMIDE 10 MG/ML (PF) SYRINGE
PREFILLED_SYRINGE | INTRAVENOUS | Status: DC | PRN
  Administered 2022-10-22: 40 mg via INTRAVENOUS
  Administered 2022-10-22: 20 mg via INTRAVENOUS

## 2022-10-22 MED ORDER — ONDANSETRON HCL 4 MG/2ML IJ SOLN
INTRAMUSCULAR | Status: DC | PRN
  Administered 2022-10-22: 4 mg via INTRAVENOUS

## 2022-10-22 MED ORDER — OXYCODONE HCL 5 MG PO TABS
10.0000 mg | ORAL_TABLET | ORAL | Status: DC | PRN
  Administered 2022-10-23: 10 mg via ORAL

## 2022-10-22 MED ORDER — OXYCODONE HCL 5 MG/5ML PO SOLN: 5.0000 mg | Freq: Once | ORAL | Status: DC | PRN

## 2022-10-22 MED ORDER — KETOROLAC TROMETHAMINE 30 MG/ML IJ SOLN
30.0000 mg | Freq: Once | INTRAMUSCULAR | Status: AC | PRN
  Administered 2022-10-22: 30 mg via INTRAVENOUS

## 2022-10-22 MED ORDER — HYDROMORPHONE HCL 1 MG/ML IJ SOLN
0.5000 mg | INTRAMUSCULAR | Status: DC | PRN
  Administered 2022-10-25 – 2022-10-27 (×5): 1 mg via INTRAVENOUS
  Administered 2022-10-28: 0.5 mg via INTRAVENOUS
  Administered 2022-10-29 – 2022-10-30 (×2): 1 mg via INTRAVENOUS
  Filled 2022-10-22 (×8): qty 1

## 2022-10-22 MED ORDER — KETOROLAC TROMETHAMINE 30 MG/ML IJ SOLN
INTRAMUSCULAR | Status: AC
  Filled 2022-10-22: qty 1

## 2022-10-22 MED ORDER — CHLORHEXIDINE GLUCONATE 0.12 % MT SOLN: 15.0000 mL | Freq: Once | OROMUCOSAL | Status: AC

## 2022-10-22 MED ORDER — FENTANYL CITRATE (PF) 250 MCG/5ML IJ SOLN
INTRAMUSCULAR | Status: DC | PRN
  Administered 2022-10-22: 25 ug via INTRAVENOUS
  Administered 2022-10-22: 150 ug via INTRAVENOUS
  Administered 2022-10-22 (×3): 25 ug via INTRAVENOUS

## 2022-10-22 SURGICAL SUPPLY — 32 items
BAG COUNTER SPONGE SURGICOUNT (BAG) ×2 IMPLANT
BAG SPNG CNTER NS LX DISP (BAG) ×2
BNDG CMPR MED 10X6 ELC LF (GAUZE/BANDAGES/DRESSINGS) ×2
BNDG ELASTIC 6X10 VLCR STRL LF (GAUZE/BANDAGES/DRESSINGS) IMPLANT
CANISTER WOUND CARE 500ML ATS (WOUND CARE) IMPLANT
CLEANSER WND VASHE INSTL 34OZ (WOUND CARE) IMPLANT
CONNECTOR Y WND VAC (MISCELLANEOUS) IMPLANT
COVER SURGICAL LIGHT HANDLE (MISCELLANEOUS) ×2 IMPLANT
DRESSING VERAFLO CLEANSE CC (GAUZE/BANDAGES/DRESSINGS) IMPLANT
DRSG VERAFLO CLEANSE CC (GAUZE/BANDAGES/DRESSINGS) ×2
DURAPREP 26ML APPLICATOR (WOUND CARE) ×2 IMPLANT
ELECT REM PT RETURN 9FT ADLT (ELECTROSURGICAL) ×2
ELECTRODE REM PT RTRN 9FT ADLT (ELECTROSURGICAL) IMPLANT
GLOVE BIOGEL PI IND STRL 7.0 (GLOVE) ×2 IMPLANT
GOWN STRL REUS W/ TWL LRG LVL3 (GOWN DISPOSABLE) ×4 IMPLANT
GOWN STRL REUS W/TWL LRG LVL3 (GOWN DISPOSABLE) ×4
KIT BASIN OR (CUSTOM PROCEDURE TRAY) ×2 IMPLANT
KIT TURNOVER KIT B (KITS) ×2 IMPLANT
MANIFOLD NEPTUNE II (INSTRUMENTS) ×2 IMPLANT
NS IRRIG 1000ML POUR BTL (IV SOLUTION) ×2 IMPLANT
PACK ORTHO EXTREMITY (CUSTOM PROCEDURE TRAY) ×2 IMPLANT
PAD ARMBOARD 7.5X6 YLW CONV (MISCELLANEOUS) ×4 IMPLANT
PAD NEG PRESSURE SENSATRAC (MISCELLANEOUS) IMPLANT
SPONGE T-LAP 18X18 ~~LOC~~+RFID (SPONGE) ×2 IMPLANT
STOCKINETTE IMPERVIOUS 9X36 MD (GAUZE/BANDAGES/DRESSINGS) ×2 IMPLANT
SUT ETHILON 2 0 PSLX (SUTURE) IMPLANT
SYR BULB IRRIG 60ML STRL (SYRINGE) IMPLANT
TOWEL GREEN STERILE (TOWEL DISPOSABLE) ×2 IMPLANT
TUBE CONNECTING 12X1/4 (SUCTIONS) ×2 IMPLANT
UNDERPAD 30X36 HEAVY ABSORB (UNDERPADS AND DIAPERS) ×2 IMPLANT
WATER STERILE IRR 1000ML POUR (IV SOLUTION) ×2 IMPLANT
YANKAUER SUCT BULB TIP NO VENT (SUCTIONS) ×2 IMPLANT

## 2022-10-22 NOTE — Plan of Care (Signed)

## 2022-10-22 NOTE — Anesthesia Preprocedure Evaluation (Addendum)
Anesthesia Evaluation  Patient identified by MRN, date of birth, ID band Patient awake    Reviewed: Allergy & Precautions, NPO status , Patient's Chart, lab work & pertinent test results  Airway Mallampati: II  TM Distance: >3 FB Neck ROM: Full    Dental no notable dental hx.    Pulmonary neg pulmonary ROS   Pulmonary exam normal        Cardiovascular negative cardio ROS Normal cardiovascular exam     Neuro/Psych negative neurological ROS  negative psych ROS   GI/Hepatic negative GI ROS, Neg liver ROS,,,(+)     substance abuse    Endo/Other  negative endocrine ROS    Renal/GU negative Renal ROS     Musculoskeletal negative musculoskeletal ROS (+)    Abdominal   Peds  Hematology negative hematology ROS (+) Blood dyscrasia, anemia   Anesthesia Other Findings Sepsis due to cellulitis  Reproductive/Obstetrics                              Anesthesia Physical Anesthesia Plan  ASA: 2  Anesthesia Plan: General   Post-op Pain Management:    Induction: Intravenous  PONV Risk Score and Plan: 2 and Ondansetron, Dexamethasone, Midazolam and Treatment may vary due to age or medical condition  Airway Management Planned: LMA  Additional Equipment:   Intra-op Plan:   Post-operative Plan: Extubation in OR  Informed Consent: I have reviewed the patients History and Physical, chart, labs and discussed the procedure including the risks, benefits and alternatives for the proposed anesthesia with the patient or authorized representative who has indicated his/her understanding and acceptance.     Dental advisory given  Plan Discussed with: CRNA  Anesthesia Plan Comments:          Anesthesia Quick Evaluation
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
## 2022-10-22 NOTE — Transfer of Care (Signed)
Immediate Anesthesia Transfer of Care Note  Patient: Dean Nichols  Procedure(s) Performed: IRRIGATION AND DEBRIDEMENT LEG RIGHT (Right) APPLICATION OF WOUND VAC (Right: Leg Upper)  Patient Location: PACU  Anesthesia Type:General  Level of Consciousness: awake, alert , and oriented  Airway & Oxygen Therapy: Patient Spontanous Breathing  Post-op Assessment: Report given to RN and Post -op Vital signs reviewed and stable  Post vital signs: Reviewed and stable  Last Vitals:  Vitals Value Taken Time  BP 124/75 10/22/22 1520  Temp    Pulse 79 10/22/22 1523  Resp 12 10/22/22 1523  SpO2 100 % 10/22/22 1523  Vitals shown include unfiled device data.  Last Pain:  Vitals:   10/22/22 1255  TempSrc: Oral  PainSc: 7          Complications: No notable events documented.

## 2022-10-22 NOTE — Progress Notes (Addendum)
Id brief note  Wbc down/afebrile, but  Redness increased in thigh proximally and coalescing blister posterior thigh Primary team consulted ortho who took patient to OR for I&D   Labs: Lab Results  Component Value Date   WBC 17.2 (H) 10/22/2022   HGB 11.8 (L) 10/22/2022   HCT 32.4 (L) 10/22/2022   MCV 84.6 10/22/2022   PLT 231 10/22/2022   Last metabolic panel Lab Results  Component Value Date   GLUCOSE 100 (H) 10/22/2022   NA 135 10/22/2022   K 3.4 (L) 10/22/2022   CL 101 10/22/2022   CO2 25 10/22/2022   BUN 10 10/22/2022   CREATININE 1.11 10/22/2022   GFRNONAA >60 10/22/2022   CALCIUM 7.6 (L) 10/22/2022   PHOS 3.4 10/22/2022   PROT 5.3 (L) 10/22/2022   ALBUMIN 2.0 (L) 10/22/2022   BILITOT 0.9 10/22/2022   ALKPHOS 183 (H) 10/22/2022   AST 135 (H) 10/22/2022   ALT 182 (H) 10/22/2022   ANIONGAP 9 10/22/2022    Imaging: 10/6 right femur & tib/fib ct in progress  A/p Appreciate ortho involvement F/u operative finding Continue linezolid and pcn   I spent more than 5 minute reviewing data/chart, and discussing diagnostics/treatment plan with treatment team

## 2022-10-22 NOTE — Anesthesia Procedure Notes (Signed)
Procedure Name: Intubation Date/Time: 10/22/2022 2:01 PM  Performed by: Dairl Ponder, CRNAPre-anesthesia Checklist: Patient identified, Emergency Drugs available, Suction available and Patient being monitored Patient Re-evaluated:Patient Re-evaluated prior to induction Oxygen Delivery Method: Circle System Utilized Preoxygenation: Pre-oxygenation with 100% oxygen Induction Type: IV induction Ventilation: Mask ventilation without difficulty Laryngoscope Size: Mac and 4 Grade View: Grade I Tube type: Oral Tube size: 7.5 mm Number of attempts: 1 Airway Equipment and Method: Stylet and Oral airway Placement Confirmation: ETT inserted through vocal cords under direct vision, positive ETCO2 and breath sounds checked- equal and bilateral Secured at: 23 cm Tube secured with: Tape Dental Injury: Teeth and Oropharynx as per pre-operative assessment

## 2022-10-22 NOTE — Op Note (Signed)
10/18/2022 - 10/22/2022  2:54 PM  PATIENT:  Dean Nichols    PRE-OPERATIVE DIAGNOSIS: Right leg necrotizing fasciitis  POST-OPERATIVE DIAGNOSIS:  Same  PROCEDURE: Right leg excisional debridement, necrotizing fasciitis of thigh and calf, with application of medium sized wound VAC, 10 cm x 3 cm x 2 cm, and a second VAC 5 cm x 2 cm x 2 cm       SURGEON:  Eulas Post, MD  PHYSICIAN ASSISTANT: Janine Ores, PA-C, present and scrubbed throughout the case, critical for completion in a timely fashion, and for retraction, instrumentation, and closure.  ANESTHESIA:   General  PREOPERATIVE INDICATIONS:  Dean Nichols is a  27 y.o. male who was admitted with cellulitis of the right leg, and was not responding to IV antibiotics.  He had cellulitic changes, as well as extreme redness, and bulla formation on both the posterior medial calf, and posterolateral thigh.  He also had multiple skin abrasions along the anterior aspect of the leg and diffuse soft tissue swelling from the mid tibia distal.  The risks benefits and alternatives were discussed with the patient preoperatively including but not limited to the risks of infection, bleeding, nerve injury, cardiopulmonary complications, the need for revision surgery, among others, and the patient was willing to proceed.  We also discussed the risk that he would need further operative invention.  ESTIMATED BLOOD LOSS: 75 mL  OPERATIVE IMPLANTS:   Medium sized wound VAC with 2 sponges  OPERATIVE FINDINGS: He had a dishwater type appearance to the subcutaneous tissue particularly over the proximal medial calf, and there was also an area of induration, and desquamation over the distal lateral posterior thigh.  He had diffuse soft tissue swelling around the anterior tibia, although I did not find a discrete fluid collection, and did not feel that opening this region would be beneficial.  OPERATIVE PROCEDURE: The was brought to the operating room and  placed in the supine position.  General anesthesia was administered.  He already was on antibiotics.  He was turned into the prone position.  The right lower extremity was prepped and draped in usual sterile fashion.  Timeout performed.  Incision was made first over the proximal medial gastroc, dissection was carried down bluntly, I did not encounter the sural nerve.  The fascia was identified, incised, and I excised all of the fascia within the wound exposing the muscular belly of the gastrocnemius.  The fascia was sent for Gram stain culture and sensitivity.  I then explored the wound, and the leg, and although that it was diffusely cellulitic, I did not feel like there was a specific fluid collection that would be benefited by further incision, although there was an area of more discrete induration over the distal lateral thigh posteriorly, which also had desquamation, this was incised, the necrotic skin was excised using a lap sponge, and I dissected down into the subcutaneous tissue onto the fascia.  This was fairly close to the peroneal nerve, so I did not go deep.  I then used 1 bottle of Vashe solution to irrigate through the wounds, and then irrigated 3 L of normal saline, and then applied wound VAC sponges to each of the wounds, along with #2 retention sutures.  Satisfactory seal was achieved.  The leg was dressed with a light compressive wrap, and he was awakened and turned supine and returned to the PACU in stable and satisfactory condition.  There were no complications and he tolerated the procedure well.  He will continue on IV antibiotics, and will likely go back to the operating room Wednesday for repeat washout and exploration with Dr. Lajoyce Corners.  Hopefully the cellulitic progression will be halted, and the significant edema in the pretibial area reversed.  Teryl Lucy, MD  Debridement type: Excisional Debridement  Side: right  Body Location: thigh and calf   Tools used for debridement:  scalpel and scissors  Pre-debridement Wound size (cm):   Length: 0        Width: 0     Depth: 0   Post-debridement Wound size (cm):   Length: 10        Width: 3     Depth: 2 and a second one more proximal 5x2x1   Debridement depth beyond dead/damaged tissue down to healthy viable tissue: yes  Tissue layer involved: skin, subcutaneous tissue, muscle / fascia  Nature of tissue removed: Slough, Necrotic, Devitalized Tissue, and Non-viable tissue  Irrigation volume: 4L     Irrigation fluid type: Other: Vasch and saline

## 2022-10-22 NOTE — Progress Notes (Signed)
Reviewed CAT scans, no definitive fluid collection, but leg very concerning for necrotizing cellulitis versus fasciitis.  I have discussed the case and reviewed with Dr. Aldean Baker, who agreed that emergent surgical debridement was indicated, plan for excisional debridement with placement of a wound VAC, likely need multiple operations, Dr. Lajoyce Corners will consult tomorrow, probable secondary operation on Wednesday depending on clinical progress.  I have reviewed the risks benefits and alternatives as well as operative plan with the patient and he is willing to proceed.  Teryl Lucy, MD

## 2022-10-22 NOTE — Anesthesia Postprocedure Evaluation (Signed)
Anesthesia Post Note  Patient: Dean Nichols  Procedure(s) Performed: IRRIGATION AND DEBRIDEMENT LEG RIGHT (Right) APPLICATION OF WOUND VAC (Right: Leg Upper)     Patient location during evaluation: PACU Anesthesia Type: General Level of consciousness: awake Pain management: pain level controlled Vital Signs Assessment: post-procedure vital signs reviewed and stable Respiratory status: spontaneous breathing, nonlabored ventilation and respiratory function stable Cardiovascular status: blood pressure returned to baseline and stable Postop Assessment: no apparent nausea or vomiting Anesthetic complications: no   No notable events documented.  Last Vitals:  Vitals:   10/22/22 1600 10/22/22 1628  BP: 112/63 111/65  Pulse: 65 63  Resp: 13 19  Temp:  36.7 C  SpO2: 95% 97%    Last Pain:  Vitals:   10/22/22 1600  TempSrc:   PainSc: Asleep                 Dannel Rafter P Langford Carias

## 2022-10-22 NOTE — Progress Notes (Addendum)
PROGRESS NOTE    Dean Nichols  WUJ:[id number] DOB: April 05, 1995 DOA: 10/18/2022 PCP: Pcp, No  Chief Complaint  Patient presents with   Knee Pain    Brief Narrative:   Dean Nichols is Dean Nichols 27 y.o. 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  He was working in the yard when he accidentally cut his leg around the lateral aspect of the right knee with Ronny Ruddell metal pipe.  He presented to the ED from urgent care with fevers, chills, nausea, vomiting -> sepsis -> was admitted for sepsis due to right lower extremity cellulitis.  Assessment & Plan:   Principal Problem:   Sepsis due to cellulitis [facility]) Active Problems:   AKI (acute kidney injury) (HCC)   Hyponatremia  Sepsis due to Strep pyogenes Bacteremia Cellulitis of Right Lower Extremity  Met criteria for sepsis on presentation, continues to meet criteria for sepsis with tachycardia, fever, leukocytosis Fever 10/5 PM, leukocytosis improving Exam with blister now forming behind R calf with underlying fluctuance/crepitus -> ortho consulted, stat CT - NPO for now Strep pyogenes in 1/2 cultures from 10/2 Repeat cultures from 10/4 NGx2 MRI 10/3 with severe subcutaneous edema surrounding the knee, greatest laterally - c/w cellulitis - nonspecific small knee joint effusion Appreciate ortho recs - no rec for arthrocentesis  Penicillin and linezolid  ID c/s -> appreciate recs Repeat cultures 10/4 AM - NG Echocardiogram EF 55-60%, no RWMA S/p Tdap 10/2 in ED  Elevated LFT's ? Shock liver RUQ [place] without concerning findings of liver and gallbladder Acute hepatitis panel  negative Persistent elevation, but downtrending (alk phos newly elevated today)  Acute kidney Injury  Baseline creatinine unknown Suspect due to sepsis Follow renal US (normal), UA unrevealing  Hypokalemia Replace and follow    DVT  prophylaxis: lovenox Code Status: full Family Communication: friend at bedside Disposition:   Status is: Inpatient Remains inpatient appropriate because: need for continued inpatient care   Consultants:  orthopedics  Procedures:  none  Antimicrobials:  Anti-infectives (From admission, onward)    Start     Dose/Rate Route Frequency Ordered Stop   10/20/22 2200  linezolid (ZYVOX) tablet 600 mg        600 mg Oral Every 12 hours 10/20/22 1018     10/19/22 1300  penicillin G potassium 12 Million Units in dextrose 5 % 500 mL CONTINUOUS infusion        12 Million Units 41.7 mL/hr over 12 Hours Intravenous Every 12 hours 10/19/22 1213     10/19/22 1300  linezolid (ZYVOX) IVPB 600 mg  Status:  Discontinued        600 mg 300 mL/hr over 60 Minutes Intravenous Every 12 hours 10/19/22 1213 10/20/22 1018   10/19/22 0600  vancomycin (VANCOCIN) IVPB 1000 mg/200 mL premix  Status:  Discontinued        1,000 mg 200 mL/hr over 60 Minutes Intravenous Every 12 hours 10/18/22 1734 10/19/22 1213   10/19/22 0000  ceFEPIme (MAXIPIME) 2 g in sodium chloride 0.9 % 100 mL IVPB  Status:  Discontinued        2 g 200 mL/hr over 30 Minutes Intravenous Every 8 hours 10/18/22 1734 10/19/22 1213   10/18/22 2130  vancomycin (VANCOCIN) IVPB 1000 mg/200 mL premix  Status:  Discontinued        1,000 mg 200 mL/hr over 60 Minutes Intravenous  Once 10/18/22 2033  10/18/22 2047   10/18/22 1630  ceFEPIme (MAXIPIME) 2 g in sodium chloride 0.9 % 100 mL IVPB        2 g 200 mL/hr over 30 Minutes Intravenous  Once 10/18/22 1616 10/18/22 1820   10/18/22 1630  metroNIDAZOLE (FLAGYL) IVPB 500 mg        500 mg 100 mL/hr over 60 Minutes Intravenous  Once 10/18/22 1616 10/18/22 1819   10/18/22 1630  vancomycin (VANCOCIN) IVPB 1000 mg/200 mL premix        1,000 mg 200 mL/hr over 60 Minutes Intravenous  Once 10/18/22 1616 10/18/22 1924       Subjective: No change Mom on phone and dad at bedside  Objective: Vitals:    10/21/22 1625 10/21/22 1950 10/22/22 0432 10/22/22 0750  BP: (!) 115/58 122/65 119/66 119/66  Pulse: 93 83 71 74  Resp: 18 17 18 19   Temp: (!) 100.4 F (38 C) 99.4 F (37.4 C) 98.2 F (36.8 C) 98.8 F (37.1 C)  TempSrc:  Oral Oral   SpO2: 97% 99% 98% 98%  Weight:      Height:        Intake/Output Summary (Last 24 hours) at 10/22/2022 1023 Last data filed at 10/22/2022 0432 Gross per 24 hour  Intake 892.38 ml  Output 2360 ml  Net -1467.62 ml   Filed Weights   10/18/22 1518 10/19/22 0350  Weight: 74.8 kg 75.1 kg    Examination:  General: No acute distress. Cardiovascular: intact DP pulse to RLE, regular Lungs: unlabored Neurological: Alert and oriented 3. Moves all extremities 4 with equal strength. Cranial nerves II through XII grossly intact. Extremities: continued swelling to entire right leg without significant improvement - now with large blister to back of calf with fluctuance/crepitus noted to this area (none noted elsewhere)    Data Reviewed: I have personally reviewed following labs and imaging studies  CBC: Recent Labs  Lab 10/18/22 1527 10/18/22 2050 10/19/22 1610 10/20/22 0552 10/21/22 0551 10/22/22 0639  WBC 17.9* 13.9* 11.9* 19.9* 21.6* 17.2*  NEUTROABS 16.3*  --   --  18.5* 17.6* 12.2*  HGB 16.2 13.8 12.4* 12.0* 11.6* 11.8*  HCT 44.9 37.9* 34.6* 32.7* 31.8* 32.4*  MCV 86.3 86.1 85.4 87.4 84.8 84.6  PLT 227 174 155 172 206 231    Basic Metabolic Panel: Recent Labs  Lab 10/18/22 1527 10/18/22 2050 10/19/22 0633 10/20/22 0552 10/21/22 0551 10/22/22 0639  NA 131*  --  134* 130* 132* 135  K 3.7  --  3.7 3.5 3.3* 3.4*  CL 93*  --  98 98 100 101  CO2 25  --  25 24 24 25   GLUCOSE 113*  --  113* 112* 100* 100*  BUN 25*  --  17 14 12 10   CREATININE 1.50* 1.43* 1.47* 1.14 1.23 1.11  CALCIUM 9.5  --  8.0* 7.8* 7.6* 7.6*  MG  --   --   --  1.8 1.8 1.9  PHOS  --   --   --  1.4* 2.7 3.4    GFR: Estimated Creatinine Clearance: 105.2 mL/min  (by C-G formula based on SCr of 1.11 mg/dL).  Liver Function Tests: Recent Labs  Lab 10/18/22 1527 10/19/22 0633 10/20/22 0552 10/21/22 0551 10/22/22 0639  AST 21 48* 406* 157* 135*  ALT 19 36 271* 184* 182*  ALKPHOS 70 54 75 105 183*  BILITOT 1.2 1.1 1.1 1.2 0.9  PROT 8.5* 5.4* 5.1* 5.1* 5.3*  ALBUMIN 4.6 2.5* 2.2* 2.0*  2.0*    CBG: No results for input(s): "GLUCAP" in the last 168 hours.   Recent Results (from the past 240 hour(s))  Culture, blood (Routine x 2)     Status: Abnormal   Collection Time: 10/18/22  3:26 PM   Specimen: BLOOD LEFT ARM  Result Value Ref Range Status   Specimen Description   Final    BLOOD LEFT ARM Performed at [facility], [address], [place], [place] [address]    Special Requests   Final    BOTTLES DRAWN AEROBIC AND ANAEROBIC Blood Culture adequate volume Performed at [facility], [address], [place], [place] [address]    Culture  Setup Time   Final    GRAM POSITIVE COCCI IN CHAINS ANAEROBIC BOTTLE ONLY CRITICAL RESULT CALLED TO, READ BACK BY AND VERIFIED WITH: PHARMD BAILEY Dean Nichols 1153 FCP    Culture (Dean Nichols)  Final    STREPTOCOCCUS PYOGENES HEALTH DEPARTMENT NOTIFIED Performed at [facility], [address] [place], [place] [address]    Report Status 10/21/2022 FINAL  Final   Organism ID, Bacteria STREPTOCOCCUS PYOGENES  Final      Susceptibility   Streptococcus pyogenes - MIC*    PENICILLIN <=0.06 SENSITIVE Sensitive     CEFTRIAXONE <=0.12 SENSITIVE Sensitive     ERYTHROMYCIN <=0.12 SENSITIVE Sensitive     LEVOFLOXACIN 0.5 SENSITIVE Sensitive     VANCOMYCIN 0.25 SENSITIVE Sensitive     * STREPTOCOCCUS PYOGENES  Blood Culture ID Panel (Reflexed)     Status: Abnormal   Collection Time: 10/18/22  3:26 PM  Result Value Ref Range Status   Enterococcus faecalis NOT DETECTED NOT DETECTED Final   Enterococcus Faecium NOT DETECTED NOT DETECTED Final   Listeria monocytogenes NOT DETECTED NOT DETECTED  Final   Staphylococcus species NOT DETECTED NOT DETECTED Final   Staphylococcus aureus (BCID) NOT DETECTED NOT DETECTED Final   Staphylococcus epidermidis NOT DETECTED NOT DETECTED Final   Staphylococcus lugdunensis NOT DETECTED NOT DETECTED Final   Streptococcus species DETECTED (Dean Nichols) NOT DETECTED Final    Comment: CRITICAL RESULT CALLED TO, READ BACK BY AND VERIFIED WITH: PHARMD BAILEY Dean Nichols 1153 FCP    Streptococcus agalactiae NOT DETECTED NOT DETECTED Final   Streptococcus pneumoniae NOT DETECTED NOT DETECTED Final   Streptococcus pyogenes DETECTED (Dean Nichols) NOT DETECTED Final    Comment: CRITICAL RESULT CALLED TO, READ BACK BY AND VERIFIED WITH: PHARMD BAILEY Dean Nichols 1153 FCP    Dean Nichols.calcoaceticus-baumannii NOT DETECTED NOT DETECTED Final   Bacteroides fragilis NOT DETECTED NOT DETECTED Final   Enterobacterales NOT DETECTED NOT DETECTED Final   Enterobacter cloacae complex NOT DETECTED NOT DETECTED Final   Escherichia coli NOT DETECTED NOT DETECTED Final   Klebsiella aerogenes NOT DETECTED NOT DETECTED Final   Klebsiella oxytoca NOT DETECTED NOT DETECTED Final   Klebsiella pneumoniae NOT DETECTED NOT DETECTED Final   Proteus species NOT DETECTED NOT DETECTED Final   Salmonella species NOT DETECTED NOT DETECTED Final   Serratia marcescens NOT DETECTED NOT DETECTED Final   Haemophilus influenzae NOT DETECTED NOT DETECTED Final   Neisseria meningitidis NOT DETECTED NOT DETECTED Final   Pseudomonas aeruginosa NOT DETECTED NOT DETECTED Final   Stenotrophomonas maltophilia NOT DETECTED NOT DETECTED Final   Candida albicans NOT DETECTED NOT DETECTED Final   Candida auris NOT DETECTED NOT DETECTED Final   Candida glabrata NOT DETECTED NOT DETECTED Final   Candida krusei NOT DETECTED NOT DETECTED Final   Candida parapsilosis NOT DETECTED NOT DETECTED Final  Candida tropicalis NOT DETECTED NOT DETECTED Final   Cryptococcus neoformans/gattii NOT DETECTED NOT DETECTED Final    Comment: Performed at [facility], [address], [place], [place] [address]  Culture, blood (Routine x 2)     Status: None (Preliminary result)   Collection Time: 10/18/22  3:31 PM   Specimen: BLOOD  Result Value Ref Range Status   Specimen Description   Final    BLOOD LEFT ANTECUBITAL Performed at [facility], [address], [place], [place] [address]    Special Requests   Final    BOTTLES DRAWN AEROBIC AND ANAEROBIC Blood Culture adequate volume Performed at [facility], [address] [place], [place] [address]    Culture   Final    NO GROWTH 4 DAYS Performed at [facility], [address], [place], [place] [address]    Report Status PENDING  Incomplete  Culture, blood (Routine X 2) w Reflex to ID Panel     Status: None (Preliminary result)   Collection Time: 10/20/22  5:52 AM   Specimen: BLOOD  Result Value Ref Range Status   Specimen Description BLOOD BLOOD RIGHT ARM  Final   Special Requests   Final    BOTTLES DRAWN AEROBIC AND ANAEROBIC Blood Culture adequate volume   Culture   Final    NO GROWTH 2 DAYS Performed at [facility], [address], [place], [place] [address]    Report Status PENDING  Incomplete  Culture, blood (Routine X 2) w Reflex to ID Panel     Status: None (Preliminary result)   Collection Time: 10/20/22  5:57 AM   Specimen: BLOOD  Result Value Ref Range Status   Specimen Description BLOOD BLOOD RIGHT ARM  Final   Special Requests   Final    BOTTLES DRAWN AEROBIC AND ANAEROBIC Blood Culture adequate volume   Culture   Final    NO GROWTH 2 DAYS Performed at [facility], [address], [place], [place] [address]    Report Status PENDING  Incomplete         Radiology Studies: US Abdomen Limited RUQ (LIVER/GB)  Result Date: 10/20/2022 CLINICAL DATA:  Elevated LFTs. EXAM: ULTRASOUND ABDOMEN LIMITED RIGHT UPPER QUADRANT COMPARISON:  None Available. FINDINGS: Gallbladder: No gallstones or wall  thickening visualized. No sonographic Murphy sign noted by sonographer. The gallbladder is somewhat contracted. Maximal wall thickness is 2.4 mm. Common bile duct: Diameter: 3.6 mm, within normal limits. Liver: No focal lesion identified. Within normal limits in parenchymal echogenicity. Portal vein is patent on color Doppler imaging with normal direction of blood flow towards the liver. Other: The right kidney is incidentally imaged. The parenchyma is hyperechoic relative to the index organ, the liver. No discrete lesions or obstruction are present. IMPRESSION: 1. Normal sonographic appearance of the liver and gallbladder. 2. Hyperechoic appearance of the right kidney. This is Ireta Pullman nonspecific finding, but can be seen in the setting of medical renal disease. Electronically Signed   By: Marin Roberts M.D.   On: 10/20/2022 19:03        Scheduled Meds:  enoxaparin (LOVENOX) injection  40 mg Subcutaneous Q24H   linezolid  600 mg Oral Q12H   phosphorus  500 mg Oral QID   Continuous Infusions:  penicillin G potassium 12 Million Units in dextrose 5 % 500 mL CONTINUOUS infusion 12 Million Units (10/22/22 0424)     LOS: 4 days    Time spent: over  30 min 40 min critical care time with concern for deep tissue infection in patient with strep pyogenes bacteremia   Lacretia Nicks, MD [facility]   To contact the attending provider between 7A-7P or the covering provider during after hours 7P-7A, please log into the web site [email or web address] and access using universal [place] password for that web site. If you do not have the password, please call the hospital operator.  10/22/2022, 10:23 AM

## 2022-10-23 ENCOUNTER — Encounter (HOSPITAL_COMMUNITY): Payer: Self-pay | Admitting: Orthopedic Surgery

## 2022-10-23 DIAGNOSIS — M726 Necrotizing fasciitis: Secondary | ICD-10-CM

## 2022-10-23 DIAGNOSIS — A400 Sepsis due to streptococcus, group A: Secondary | ICD-10-CM | POA: Diagnosis not present

## 2022-10-23 DIAGNOSIS — B950 Streptococcus, group A, as the cause of diseases classified elsewhere: Secondary | ICD-10-CM

## 2022-10-23 DIAGNOSIS — A419 Sepsis, unspecified organism: Secondary | ICD-10-CM | POA: Diagnosis not present

## 2022-10-23 DIAGNOSIS — L0390 Cellulitis, unspecified: Secondary | ICD-10-CM | POA: Diagnosis not present

## 2022-10-23 LAB — CBC WITH DIFFERENTIAL/PLATELET
Abs Immature Granulocytes: 0.4 10*3/uL — ABNORMAL HIGH (ref 0.00–0.07)
Basophils Absolute: 0.1 10*3/uL (ref 0.0–0.1)
Basophils Relative: 0 %
Eosinophils Absolute: 0 10*3/uL (ref 0.0–0.5)
Eosinophils Relative: 0 %
HCT: 35.9 % — ABNORMAL LOW (ref 39.0–52.0)
Hemoglobin: 12.8 g/dL — ABNORMAL LOW (ref 13.0–17.0)
Immature Granulocytes: 2 %
Lymphocytes Relative: 13 %
Lymphs Abs: 2.4 10*3/uL (ref 0.7–4.0)
MCH: 30.5 pg (ref 26.0–34.0)
MCHC: 35.7 g/dL (ref 30.0–36.0)
MCV: 85.7 fL (ref 80.0–100.0)
Monocytes Absolute: 1.3 10*3/uL — ABNORMAL HIGH (ref 0.1–1.0)
Monocytes Relative: 7 %
Neutro Abs: 14.7 10*3/uL — ABNORMAL HIGH (ref 1.7–7.7)
Neutrophils Relative %: 78 %
Platelets: 298 10*3/uL (ref 150–400)
RBC: 4.19 MIL/uL — ABNORMAL LOW (ref 4.22–5.81)
RDW: 12.8 % (ref 11.5–15.5)
WBC: 18.9 10*3/uL — ABNORMAL HIGH (ref 4.0–10.5)
nRBC: 0 % (ref 0.0–0.2)

## 2022-10-23 LAB — COMPREHENSIVE METABOLIC PANEL
ALT: 162 U/L — ABNORMAL HIGH (ref 0–44)
AST: 67 U/L — ABNORMAL HIGH (ref 15–41)
Albumin: 2.1 g/dL — ABNORMAL LOW (ref 3.5–5.0)
Alkaline Phosphatase: 191 U/L — ABNORMAL HIGH (ref 38–126)
Anion gap: 10 (ref 5–15)
BUN: 12 mg/dL (ref 6–20)
CO2: 26 mmol/L (ref 22–32)
Calcium: 8.1 mg/dL — ABNORMAL LOW (ref 8.9–10.3)
Chloride: 100 mmol/L (ref 98–111)
Creatinine, Ser: 1.02 mg/dL (ref 0.61–1.24)
GFR, Estimated: >60 mL/min (ref >60)
Glucose, Bld: 120 mg/dL — ABNORMAL HIGH (ref 70–99)
Potassium: 4.3 mmol/L (ref 3.5–5.1)
Sodium: 136 mmol/L (ref 135–145)
Total Bilirubin: 0.5 mg/dL (ref 0.3–1.2)
Total Protein: 5.8 g/dL — ABNORMAL LOW (ref 6.5–8.1)

## 2022-10-23 LAB — CULTURE, BLOOD (ROUTINE X 2)
Culture: NO GROWTH
Special Requests: ADEQUATE

## 2022-10-23 LAB — PHOSPHORUS: Phosphorus: 3.2 mg/dL (ref 2.5–4.6)

## 2022-10-23 LAB — MAGNESIUM: Magnesium: 2.3 mg/dL (ref 1.7–2.4)

## 2022-10-23 MED ORDER — OXYCODONE HCL 5 MG PO TABS
5.0000 mg | ORAL_TABLET | ORAL | Status: DC | PRN
  Administered 2022-10-25 – 2022-10-30 (×9): 5 mg via ORAL
  Filled 2022-10-23 (×9): qty 1

## 2022-10-23 MED ORDER — ACETAMINOPHEN 325 MG PO TABS: 650.0000 mg | ORAL_TABLET | Freq: Four times a day (QID) | ORAL | Status: DC | PRN

## 2022-10-23 MED ORDER — IBUPROFEN 200 MG PO TABS
400.0000 mg | ORAL_TABLET | Freq: Four times a day (QID) | ORAL | Status: DC | PRN
  Administered 2022-10-25: 400 mg via ORAL
  Filled 2022-10-23: qty 2

## 2022-10-23 MED ORDER — PANTOPRAZOLE SODIUM 40 MG PO TBEC
40.0000 mg | DELAYED_RELEASE_TABLET | Freq: Every day | ORAL | Status: DC
  Administered 2022-10-23 – 2022-10-25 (×3): 40 mg via ORAL
  Filled 2022-10-23 (×3): qty 1

## 2022-10-23 MED ORDER — ACETAMINOPHEN 500 MG PO TABS
1000.0000 mg | ORAL_TABLET | Freq: Three times a day (TID) | ORAL | Status: AC
  Administered 2022-10-23 – 2022-10-30 (×17): 1000 mg via ORAL
  Filled 2022-10-23 (×18): qty 2

## 2022-10-23 MED ORDER — OXYCODONE HCL 5 MG PO TABS
10.0000 mg | ORAL_TABLET | ORAL | Status: DC | PRN
  Administered 2022-10-24: 10 mg via ORAL
  Filled 2022-10-23: qty 2

## 2022-10-23 NOTE — Progress Notes (Signed)
[facility]  Date of Admission:  10/18/2022     Total days of antibiotics: 6 Linezolid Penicillin         ASSESSMENT: Dean Nichols is a 27 year old male presenting with right leg wound necrotizing infection/sepsis with strep pyrogenes.   WBC slightly increased to 18.9 from 17.2 yesterday. Has remained afebrile yesterday and today. Right leg was debrided yesterday. LFTs improving. Wound tissue culture (10/6) no growth. Repeat blood cultures (10/4) continue to have no growth.   PLAN:  Continue linezolid and penicillin  Monitor blood and tissue cultures for growth 3.   Repeat I&D planned for wed and fri per ortho  Principal Problem:   Sepsis due to cellulitis [facility]) Active Problems:   AKI (acute kidney injury) (HCC)   Hyponatremia   Necrotizing fasciitis due to Streptococcus pyogenes (HCC)    acetaminophen  1,000 mg Oral Q8H   docusate sodium  100 mg Oral BID   enoxaparin (LOVENOX) injection  40 mg Subcutaneous Q24H   linezolid  600 mg Oral Q12H    SUBJECTIVE: Feeling much better than this weekend. Alert, sitting up in bed.   No Known Allergies  Review of Systems: Review of Systems  Constitutional:  Negative for chills and fever.  Respiratory: Negative.    Cardiovascular:  Positive for leg swelling.       Right   Gastrointestinal:  Negative for nausea and vomiting.  Neurological:  Negative for dizziness and headaches.    OBJECTIVE: Vitals:   10/22/22 1628 10/22/22 2044 10/23/22 0614 10/23/22 0755  BP: 111/65 109/62 113/69 116/67  Pulse: 63 62 87 62  Resp: 19 19 18 15   Temp: 98.1 F (36.7 C) (!) 97.4 F (36.3 C) 98.8 F (37.1 C) 97.9 F (36.6 C)  TempSrc:  Oral Oral Oral  SpO2: 97% 98% 98% 98%  Weight:      Height:       Body mass index is 22.45 kg/m.  Physical Exam Constitutional:      General: He is not in acute distress. Cardiovascular:     Rate and Rhythm: Normal rate and regular rhythm.  Pulmonary:     Effort:  Pulmonary effort is normal.  Abdominal:     Palpations: Abdomen is soft.  Musculoskeletal:        General: Swelling present.     Comments: Right leg wrapped, swollen and red with wound vac placed  Skin:    General: Skin is warm and dry.  Neurological:     Mental Status: He is alert and oriented to person, place, and time.     Lab Results Lab Results  Component Value Date   WBC 18.9 (H) 10/23/2022   HGB 12.8 (L) 10/23/2022   HCT 35.9 (L) 10/23/2022   MCV 85.7 10/23/2022   PLT 298 10/23/2022    Lab Results  Component Value Date   CREATININE 1.02 10/23/2022   BUN 12 10/23/2022   NA 136 10/23/2022   K 4.3 10/23/2022   CL 100 10/23/2022   CO2 26 10/23/2022    Lab Results  Component Value Date   ALT 162 (H) 10/23/2022   AST 67 (H) 10/23/2022   ALKPHOS 191 (H) 10/23/2022   BILITOT 0.5 10/23/2022     Microbiology: Recent Results (from the past 240 hour(s))  Culture, blood (Routine x 2)     Status: Abnormal   Collection Time: 10/18/22  3:26 PM   Specimen: BLOOD LEFT ARM  Result Value Ref Range  Status   Specimen Description   Final    BLOOD LEFT ARM Performed at [facility], [address] [place], [place] [address]    Special Requests   Final    BOTTLES DRAWN AEROBIC AND ANAEROBIC Blood Culture adequate volume Performed at [facility], [address] [place], [place] [address]    Culture  Setup Time   Final    GRAM POSITIVE COCCI IN CHAINS ANAEROBIC BOTTLE ONLY CRITICAL RESULT CALLED TO, READ BACK BY AND VERIFIED WITH: PHARMD BAILEY A 1153 FCP    Culture (A)  Final    STREPTOCOCCUS PYOGENES HEALTH DEPARTMENT NOTIFIED Performed at [facility], [address], [place], [place] [address]    Report Status 10/21/2022 FINAL  Final   Organism ID, Bacteria STREPTOCOCCUS PYOGENES  Final      Susceptibility   Streptococcus pyogenes - MIC*    PENICILLIN <=0.06 SENSITIVE Sensitive     CEFTRIAXONE <=0.12 SENSITIVE Sensitive      ERYTHROMYCIN <=0.12 SENSITIVE Sensitive     LEVOFLOXACIN 0.5 SENSITIVE Sensitive     VANCOMYCIN 0.25 SENSITIVE Sensitive     * STREPTOCOCCUS PYOGENES  Blood Culture ID Panel (Reflexed)     Status: Abnormal   Collection Time: 10/18/22  3:26 PM  Result Value Ref Range Status   Enterococcus faecalis NOT DETECTED NOT DETECTED Final   Enterococcus Faecium NOT DETECTED NOT DETECTED Final   Listeria monocytogenes NOT DETECTED NOT DETECTED Final   Staphylococcus species NOT DETECTED NOT DETECTED Final   Staphylococcus aureus (BCID) NOT DETECTED NOT DETECTED Final   Staphylococcus epidermidis NOT DETECTED NOT DETECTED Final   Staphylococcus lugdunensis NOT DETECTED NOT DETECTED Final   Streptococcus species DETECTED (A) NOT DETECTED Final    Comment: CRITICAL RESULT CALLED TO, READ BACK BY AND VERIFIED WITH: PHARMD BAILEY A 1153 FCP    Streptococcus agalactiae NOT DETECTED NOT DETECTED Final   Streptococcus pneumoniae NOT DETECTED NOT DETECTED Final   Streptococcus pyogenes DETECTED (A) NOT DETECTED Final    Comment: CRITICAL RESULT CALLED TO, READ BACK BY AND VERIFIED WITH: PHARMD BAILEY A 1153 FCP    A.calcoaceticus-baumannii NOT DETECTED NOT DETECTED Final   Bacteroides fragilis NOT DETECTED NOT DETECTED Final   Enterobacterales NOT DETECTED NOT DETECTED Final   Enterobacter cloacae complex NOT DETECTED NOT DETECTED Final   Escherichia coli NOT DETECTED NOT DETECTED Final   Klebsiella aerogenes NOT DETECTED NOT DETECTED Final   Klebsiella oxytoca NOT DETECTED NOT DETECTED Final   Klebsiella pneumoniae NOT DETECTED NOT DETECTED Final   Proteus species NOT DETECTED NOT DETECTED Final   Salmonella species NOT DETECTED NOT DETECTED Final   Serratia marcescens NOT DETECTED NOT DETECTED Final   Haemophilus influenzae NOT DETECTED NOT DETECTED Final   Neisseria meningitidis NOT DETECTED NOT DETECTED Final   Pseudomonas aeruginosa NOT DETECTED NOT DETECTED Final   Stenotrophomonas maltophilia  NOT DETECTED NOT DETECTED Final   Candida albicans NOT DETECTED NOT DETECTED Final   Candida auris NOT DETECTED NOT DETECTED Final   Candida glabrata NOT DETECTED NOT DETECTED Final   Candida krusei NOT DETECTED NOT DETECTED Final   Candida parapsilosis NOT DETECTED NOT DETECTED Final   Candida tropicalis NOT DETECTED NOT DETECTED Final   Cryptococcus neoformans/gattii NOT DETECTED NOT DETECTED Final    Comment: Performed at [facility], [address] [place], [place] [address]  Culture, blood (Routine x 2)     Status: None   Collection Time: 10/18/22  3:31  PM   Specimen: BLOOD  Result Value Ref Range Status   Specimen Description   Final    BLOOD LEFT ANTECUBITAL Performed at [facility], [address] [place], [place] [address]    Special Requests   Final    BOTTLES DRAWN AEROBIC AND ANAEROBIC Blood Culture adequate volume Performed at [facility], [address], [place], [place] [address]    Culture   Final    NO GROWTH 5 DAYS Performed at [facility], [address], [place], [place] [address]    Report Status 10/23/2022 FINAL  Final  Culture, blood (Routine X 2) w Reflex to ID Panel     Status: None (Preliminary result)   Collection Time: 10/20/22  5:52 AM   Specimen: BLOOD  Result Value Ref Range Status   Specimen Description BLOOD BLOOD RIGHT ARM  Final   Special Requests   Final    BOTTLES DRAWN AEROBIC AND ANAEROBIC Blood Culture adequate volume   Culture   Final    NO GROWTH 3 DAYS Performed at [facility], [address] [place], [place] [address]    Report Status PENDING  Incomplete  Culture, blood (Routine X 2) w Reflex to ID Panel     Status: None (Preliminary result)   Collection Time: 10/20/22  5:57 AM   Specimen: BLOOD  Result Value Ref Range Status   Specimen Description BLOOD BLOOD RIGHT ARM  Final   Special Requests   Final    BOTTLES DRAWN AEROBIC AND ANAEROBIC Blood Culture adequate  volume   Culture   Final    NO GROWTH 3 DAYS Performed at [facility], [address], [place], [place] [address]    Report Status PENDING  Incomplete  Aerobic/Anaerobic Culture w Gram Stain (surgical/deep wound)     Status: None (Preliminary result)   Collection Time: 10/22/22  2:51 PM   Specimen: Soft Tissue, Other  Result Value Ref Range Status   Specimen Description TISSUE  Final   Special Requests right gastroc fascia  Final   Gram Stain NO WBC SEEN NO ORGANISMS SEEN   Final   Culture   Final    NO GROWTH < 24 HOURS Performed at [facility], [address], [place], [place] [address]    Report Status PENDING  Incomplete   Dean Ree RN, Nurse Practitioner Student

## 2022-10-23 NOTE — Progress Notes (Signed)
Subjective: No new complaints   Antibiotics:  Anti-infectives (From admission, onward)    Start     Dose/Rate Route Frequency Ordered Stop   10/20/22 2200  linezolid (ZYVOX) tablet 600 mg        600 mg Oral Every 12 hours 10/20/22 1018     10/19/22 1300  penicillin G potassium 12 Million Units in dextrose 5 % 500 mL CONTINUOUS infusion        12 Million Units 41.7 mL/hr over 12 Hours Intravenous Every 12 hours 10/19/22 1213     10/19/22 1300  linezolid (ZYVOX) IVPB 600 mg  Status:  Discontinued        600 mg 300 mL/hr over 60 Minutes Intravenous Every 12 hours 10/19/22 1213 10/20/22 1018   10/19/22 0600  vancomycin (VANCOCIN) IVPB 1000 mg/200 mL premix  Status:  Discontinued        1,000 mg 200 mL/hr over 60 Minutes Intravenous Every 12 hours 10/18/22 1734 10/19/22 1213   10/19/22 0000  ceFEPIme (MAXIPIME) 2 g in sodium chloride 0.9 % 100 mL IVPB  Status:  Discontinued        2 g 200 mL/hr over 30 Minutes Intravenous Every 8 hours 10/18/22 1734 10/19/22 1213   10/18/22 2130  vancomycin (VANCOCIN) IVPB 1000 mg/200 mL premix  Status:  Discontinued        1,000 mg 200 mL/hr over 60 Minutes Intravenous  Once 10/18/22 2033 10/18/22 2047   10/18/22 1630  ceFEPIme (MAXIPIME) 2 g in sodium chloride 0.9 % 100 mL IVPB        2 g 200 mL/hr over 30 Minutes Intravenous  Once 10/18/22 1616 10/18/22 1820   10/18/22 1630  metroNIDAZOLE (FLAGYL) IVPB 500 mg        500 mg 100 mL/hr over 60 Minutes Intravenous  Once 10/18/22 1616 10/18/22 1819   10/18/22 1630  vancomycin (VANCOCIN) IVPB 1000 mg/200 mL premix        1,000 mg 200 mL/hr over 60 Minutes Intravenous  Once 10/18/22 1616 10/18/22 1924       Medications: Scheduled Meds:  acetaminophen  1,000 mg Oral Q8H   docusate sodium  100 mg Oral BID   enoxaparin (LOVENOX) injection  40 mg Subcutaneous Q24H   linezolid  600 mg Oral Q12H   pantoprazole  40 mg Oral Daily   Continuous Infusions:  methocarbamol (ROBAXIN) IV      penicillin G potassium 12 Million Units in dextrose 5 % 500 mL CONTINUOUS infusion 12 Million Units (10/23/22 0359)   PRN Meds:.acetaminophen **FOLLOWED BY** [START ON 10/30/2022] acetaminophen, bisacodyl, diphenhydrAMINE, HYDROmorphone (DILAUDID) injection, [START ON 10/24/2022] ibuprofen, ketorolac, magnesium citrate, methocarbamol **OR** methocarbamol (ROBAXIN) IV, ondansetron **OR** ondansetron (ZOFRAN) IV, oxyCODONE **OR** oxyCODONE, polyethylene glycol    Objective: Weight change:   Intake/Output Summary (Last 24 hours) at 10/23/2022 1700 Last data filed at 10/23/2022 1543 Gross per 24 hour  Intake 984.12 ml  Output 1700 ml  Net -715.88 ml   Blood pressure 116/67, pulse 62, temperature 97.9 F (36.6 C), temperature source Oral, resp. rate 15, height 6' (1.829 m), weight 75.1 kg, SpO2 98%. Temp:  [97.4 F (36.3 C)-98.8 F (37.1 C)] 97.9 F (36.6 C) (10/07 0755) Pulse Rate:  [62-87] 62 (10/07 0755) Resp:  [15-19] 15 (10/07 0755) BP: (109-116)/(62-69) 116/67 (10/07 0755) SpO2:  [98 %] 98 % (10/07 0755)  Physical Exam: Physical Exam Constitutional:      Appearance: He is well-developed.  HENT:  Head: Normocephalic and atraumatic.  Eyes:     Conjunctiva/sclera: Conjunctivae normal.  Cardiovascular:     Rate and Rhythm: Normal rate and regular rhythm.  Pulmonary:     Effort: Pulmonary effort is normal. No respiratory distress.     Breath sounds: No wheezing.  Abdominal:     General: There is no distension.     Palpations: Abdomen is soft.  Musculoskeletal:        General: Normal range of motion.     Cervical back: Normal range of motion and neck supple.  Skin:    General: Skin is warm and dry.     Findings: No erythema or rash.  Neurological:     General: No focal deficit present.     Mental Status: He is alert and oriented to person, place, and time.  Psychiatric:        Mood and Affect: Mood normal.        Behavior: Behavior normal.        Thought Content:  Thought content normal.        Judgment: Judgment normal.     Right LE wrapped  CBC:    BMET Recent Labs    10/22/22 0639 10/23/22 1008  NA 135 136  K 3.4* 4.3  CL 101 100  CO2 25 26  GLUCOSE 100* 120*  BUN 10 12  CREATININE 1.11 1.02  CALCIUM 7.6* 8.1*     Liver Panel  Recent Labs    10/22/22 0639 10/23/22 1008  PROT 5.3* 5.8*  ALBUMIN 2.0* 2.1*  AST 135* 67*  ALT 182* 162*  ALKPHOS 183* 191*  BILITOT 0.9 0.5       Sedimentation Rate No results for input(s): "ESRSEDRATE" in the last 72 hours. C-Reactive Protein No results for input(s): "CRP" in the last 72 hours.  Micro Results: Recent Results (from the past 720 hour(s))  Culture, blood (Routine x 2)     Status: Abnormal   Collection Time: 10/18/22  3:26 PM   Specimen: BLOOD LEFT ARM  Result Value Ref Range Status   Specimen Description   Final    BLOOD LEFT ARM Performed at [facility], [address], [place], [place] [address]    Special Requests   Final    BOTTLES DRAWN AEROBIC AND ANAEROBIC Blood Culture adequate volume Performed at [facility], [address], [place], [place] [address]    Culture  Setup Time   Final    GRAM POSITIVE COCCI IN CHAINS ANAEROBIC BOTTLE ONLY CRITICAL RESULT CALLED TO, READ BACK BY AND VERIFIED WITH: PHARMD BAILEY A 1153 FCP    Culture (A)  Final    STREPTOCOCCUS PYOGENES HEALTH DEPARTMENT NOTIFIED Performed at [facility], [address] [place], [place] [address]    Report Status 10/21/2022 FINAL  Final   Organism ID, Bacteria STREPTOCOCCUS PYOGENES  Final      Susceptibility   Streptococcus pyogenes - MIC*    PENICILLIN <=0.06 SENSITIVE Sensitive     CEFTRIAXONE <=0.12 SENSITIVE Sensitive     ERYTHROMYCIN <=0.12 SENSITIVE Sensitive     LEVOFLOXACIN 0.5 SENSITIVE Sensitive     VANCOMYCIN 0.25 SENSITIVE Sensitive     * STREPTOCOCCUS PYOGENES  Blood Culture ID Panel (Reflexed)     Status: Abnormal   Collection  Time: 10/18/22  3:26 PM  Result Value Ref Range Status   Enterococcus faecalis NOT DETECTED NOT DETECTED Final   Enterococcus Faecium NOT DETECTED NOT DETECTED Final  Listeria monocytogenes NOT DETECTED NOT DETECTED Final   Staphylococcus species NOT DETECTED NOT DETECTED Final   Staphylococcus aureus (BCID) NOT DETECTED NOT DETECTED Final   Staphylococcus epidermidis NOT DETECTED NOT DETECTED Final   Staphylococcus lugdunensis NOT DETECTED NOT DETECTED Final   Streptococcus species DETECTED (A) NOT DETECTED Final    Comment: CRITICAL RESULT CALLED TO, READ BACK BY AND VERIFIED WITH: PHARMD BAILEY A 1153 FCP    Streptococcus agalactiae NOT DETECTED NOT DETECTED Final   Streptococcus pneumoniae NOT DETECTED NOT DETECTED Final   Streptococcus pyogenes DETECTED (A) NOT DETECTED Final    Comment: CRITICAL RESULT CALLED TO, READ BACK BY AND VERIFIED WITH: PHARMD BAILEY A 1153 FCP    A.calcoaceticus-baumannii NOT DETECTED NOT DETECTED Final   Bacteroides fragilis NOT DETECTED NOT DETECTED Final   Enterobacterales NOT DETECTED NOT DETECTED Final   Enterobacter cloacae complex NOT DETECTED NOT DETECTED Final   Escherichia coli NOT DETECTED NOT DETECTED Final   Klebsiella aerogenes NOT DETECTED NOT DETECTED Final   Klebsiella oxytoca NOT DETECTED NOT DETECTED Final   Klebsiella pneumoniae NOT DETECTED NOT DETECTED Final   Proteus species NOT DETECTED NOT DETECTED Final   Salmonella species NOT DETECTED NOT DETECTED Final   Serratia marcescens NOT DETECTED NOT DETECTED Final   Haemophilus influenzae NOT DETECTED NOT DETECTED Final   Neisseria meningitidis NOT DETECTED NOT DETECTED Final   Pseudomonas aeruginosa NOT DETECTED NOT DETECTED Final   Stenotrophomonas maltophilia NOT DETECTED NOT DETECTED Final   Candida albicans NOT DETECTED NOT DETECTED Final   Candida auris NOT DETECTED NOT DETECTED Final   Candida glabrata NOT DETECTED NOT DETECTED Final   Candida krusei NOT DETECTED NOT  DETECTED Final   Candida parapsilosis NOT DETECTED NOT DETECTED Final   Candida tropicalis NOT DETECTED NOT DETECTED Final   Cryptococcus neoformans/gattii NOT DETECTED NOT DETECTED Final    Comment: Performed at [facility], [address] [place], [place] [address]  Culture, blood (Routine x 2)     Status: None   Collection Time: 10/18/22  3:31 PM   Specimen: BLOOD  Result Value Ref Range Status   Specimen Description   Final    BLOOD LEFT ANTECUBITAL Performed at [facility], [address], [place], [place] [address]    Special Requests   Final    BOTTLES DRAWN AEROBIC AND ANAEROBIC Blood Culture adequate volume Performed at [facility], [address] [place], [place] [address]    Culture   Final    NO GROWTH 5 DAYS Performed at [facility], [address], [place], [place] [address]    Report Status 10/23/2022 FINAL  Final  Culture, blood (Routine X 2) w Reflex to ID Panel     Status: None (Preliminary result)   Collection Time: 10/20/22  5:52 AM   Specimen: BLOOD  Result Value Ref Range Status   Specimen Description BLOOD BLOOD RIGHT ARM  Final   Special Requests   Final    BOTTLES DRAWN AEROBIC AND ANAEROBIC Blood Culture adequate volume   Culture   Final    NO GROWTH 3 DAYS Performed at [facility], [address], [place], [place] [address]    Report Status PENDING  Incomplete  Culture, blood (Routine X 2) w Reflex to ID Panel     Status: None (Preliminary result)   Collection Time: 10/20/22  5:57 AM   Specimen: BLOOD  Result Value Ref Range Status   Specimen Description BLOOD  BLOOD RIGHT ARM  Final   Special Requests   Final    BOTTLES DRAWN AEROBIC AND ANAEROBIC Blood Culture adequate volume   Culture   Final    NO GROWTH 3 DAYS Performed at [facility], [address], [place], [place] [address]    Report Status PENDING  Incomplete  Aerobic/Anaerobic Culture w Gram Stain (surgical/deep  wound)     Status: None (Preliminary result)   Collection Time: 10/22/22  2:51 PM   Specimen: Soft Tissue, Other  Result Value Ref Range Status   Specimen Description TISSUE  Final   Special Requests right gastroc fascia  Final   Gram Stain NO WBC SEEN NO ORGANISMS SEEN   Final   Culture   Final    NO GROWTH < 24 HOURS Performed at [facility], [address], [place], [place] [address]    Report Status PENDING  Incomplete    Studies/Results: PERIPHERAL VASCULAR CATHETERIZATION  Result Date: 10/22/2022 See surgical note for result.  CT FEMUR RIGHT W CONTRAST  Result Date: 10/22/2022 CLINICAL DATA:  Soft tissue infection suspected, thigh. Cellulitis with concern for deep infection. EXAM: CT OF THE LOWER RIGHT EXTREMITY WITH CONTRAST TECHNIQUE: Multidetector CT imaging of the lower right extremity was performed according to the standard protocol following intravenous contrast administration. RADIATION DOSE REDUCTION: This exam was performed according to the departmental dose-optimization program which includes automated exposure control, adjustment of the mA and/or kV according to patient size and/or use of iterative reconstruction technique. CONTRAST:  75mL OMNIPAQUE IOHEXOL 350 MG/ML SOLN COMPARISON:  MRI right knee 10/19/2022, right knee radiographs 10/18/2022 FINDINGS: Bones/Joint/Cartilage Normal bone mineralization. The cortices are intact. No acute fracture or dislocation. Ligaments Suboptimally assessed by CT. Muscles and Tendons Normal density and size of the regional musculature. No gross tendon tear is seen. Soft tissues There is diffuse moderate subcutaneous fat edema and swelling throughout the right thigh, greatest within the posterior and lateral aspects, and greatest specifically at the lateral aspect of the knee where the fluid measures up to approximately 1.7 cm in transverse thickness (axial series 4, image 222 through 2056 and coronal series 8, image 57). No walled-off  rim enhancing abscess is seen. No knee joint effusion. IMPRESSION: Diffuse moderate subcutaneous fat edema and swelling throughout the right thigh, greatest within the posterior and lateral aspects, and greatest specifically at the lateral aspect of the knee. No walled-off rim enhancing abscess is seen. No evidence of acute osteomyelitis. Electronically Signed   By: Neita Garnet M.D.   On: 10/22/2022 13:33   CT TIBIA FIBULA RIGHT W CONTRAST  Result Date: 10/22/2022 CLINICAL DATA:  Cellulitis with concern for deep infection. Soft tissue infection suspected. EXAM: CT OF THE LOWER RIGHT EXTREMITY WITH CONTRAST TECHNIQUE: Multidetector CT imaging of the lower right extremity (right tibia and fibula) was performed according to the standard protocol following intravenous contrast administration. RADIATION DOSE REDUCTION: This exam was performed according to the departmental dose-optimization program which includes automated exposure control, adjustment of the mA and/or kV according to patient size and/or use of iterative reconstruction technique. CONTRAST:  75mL OMNIPAQUE IOHEXOL 350 MG/ML SOLN COMPARISON:  Right knee radiographs 03/13/2020 and 10/18/2022, MRI right knee 10/19/2022 FINDINGS: Bones/Joint/Cartilage Normal bone mineralization. The cortices are intact. No acute fracture or dislocation. Ligaments Suboptimally assessed by CT. Muscles and Tendons Normal size and density of the regional musculature. No tendon tear is seen. Soft tissues There is moderate subcutaneous fat edema and swelling throughout the visualized  right calf and dorsal right foot. This is greatest at the dorsal aspect of the foot, medial and lateral aspects of the ankle, and the posteromedial aspect of the calf. No definite walled-off abscess is visualized, however the pooling fluid in between the subcutaneous fat and the distal medial and lateral heads of the gastrocnemius muscles at the proximal Achilles musculotendinous junction measures  up to approximately 1.2 cm in AP thickness and 5.0 cm in transverse dimension (axial series 4, image 127). At the level of the ankle the medial soft tissue swelling measures up to approximately 1.4 cm in the lateral soft tissue swelling measures up to approximately 1.1 cm. There is also lateral right knee subcutaneous fat swelling measuring up to approximately 1.7 cm extending off the superior plane of view. IMPRESSION: 1. Moderate subcutaneous fat edema and swelling throughout the visualized right calf and dorsal right foot. This is greatest at the dorsal aspect of the foot, medial and lateral aspects of the ankle, and the posteromedial aspect of the calf. No definite walled-off abscess is visualized. 2. No evidence osteomyelitis. Electronically Signed   By: Neita Garnet M.D.   On: 10/22/2022 13:22      Assessment/Plan:  INTERVAL HISTORY: BP improved after surgery   Principal Problem:   Sepsis due to cellulitis [facility]) Active Problems:   AKI (acute kidney injury) (HCC)   Hyponatremia   Necrotizing fasciitis due to Streptococcus pyogenes (HCC)    Dean Nichols is a 28 y.o. male with necrotizing fasciitis and group A streptococcal bacteremia, sepsis including shock liver, status post I&D plans for him to return to the operating room later this week.  He remains on penicillin and also Zyvox with the latter being used for toxin and inhibition  I have personally spent 54 minutes involved in face-to-face and non-face-to-face activities for this patient on the day of the visit. Professional time spent includes the following activities: Preparing to see the patient (review of tests), Obtaining and/or reviewing separately obtained history (admission/discharge record), Performing a medically appropriate examination and/or evaluation , Ordering medications/tests/procedures, referring and communicating with other health care professionals, Documenting clinical information in the EMR, Independently  interpreting results (not separately reported), Communicating results to the patient/family/caregiver, Counseling and educating the patient/family/caregiver and Care coordination (not separately reported).         LOS: 5 days   Dean Nichols 10/23/2022, 5:00 PM

## 2022-10-23 NOTE — Consult Note (Signed)
ORTHOPAEDIC CONSULTATION  REQUESTING PHYSICIAN: Zigmund Daniel., *  Chief complaint: Pain and swelling and blistering right leg.  HPI: Dean Nichols is a 27 y.o. male who presents with patient is a 27 year old gentleman who is seen for initial evaluation for necrotizing fasciitis right lower extremity.  Patient underwent initial debridement with Dr. Dion Saucier yesterday.  Initial blood cultures are showing group A strep pyogenes.  Patient reports a history of trauma resulting in open wounds to the right calf.  Past Medical History:  Diagnosis Date   ACL tear    right   Past Surgical History:  Procedure Laterality Date   APPLICATION OF WOUND VAC Right 10/22/2022   Procedure: APPLICATION OF WOUND VAC;  Surgeon: Teryl Lucy, MD;  Location: [facility] OR;  Service: Orthopedics;  Laterality: Right;   I & D EXTREMITY Right 10/22/2022   Procedure: IRRIGATION AND DEBRIDEMENT LEG RIGHT;  Surgeon: Teryl Lucy, MD;  Location: [facility] OR;  Service: Orthopedics;  Laterality: Right;   WISDOM TOOTH EXTRACTION     Social History   Socioeconomic History   Marital status: Single    Spouse name: Not on file   Number of children: Not on file   Years of education: Not on file   Highest education level: Not on file  Occupational History   Not on file  Tobacco Use   Smoking status: Never   Smokeless tobacco: Never  Vaping Use   Vaping status: Never Used  Substance and Sexual Activity   Alcohol use: Not Currently   Drug use: Yes    Types: Marijuana   Sexual activity: Not on file  Other Topics Concern   Not on file  Social History Narrative   Not on file   Social Determinants of Health   Financial Resource Strain: Not on file  Food Insecurity: No Food Insecurity (10/18/2022)   Hunger Vital Sign    Worried About Running Out of Food in the Last Year: Never true    Ran Out of Food in the Last Year: Never true  Transportation Needs: No Transportation Needs (10/18/2022)   PRAPARE -  Administrator, Civil Service (Medical): No    Lack of Transportation (Non-Medical): No  Physical Activity: Not on file  Stress: Not on file  Social Connections: Not on file   History reviewed. No pertinent family history. - negative except otherwise stated in the family history section No Known Allergies Prior to Admission medications   Not on File   PERIPHERAL VASCULAR CATHETERIZATION  Result Date: 10/22/2022 See surgical note for result.  CT FEMUR RIGHT W CONTRAST  Result Date: 10/22/2022 CLINICAL DATA:  Soft tissue infection suspected, thigh. Cellulitis with concern for deep infection. EXAM: CT OF THE LOWER RIGHT EXTREMITY WITH CONTRAST TECHNIQUE: Multidetector CT imaging of the lower right extremity was performed according to the standard protocol following intravenous contrast administration. RADIATION DOSE REDUCTION: This exam was performed according to the departmental dose-optimization program which includes automated exposure control, adjustment of the mA and/or kV according to patient size and/or use of iterative reconstruction technique. CONTRAST:  75mL OMNIPAQUE IOHEXOL 350 MG/ML SOLN COMPARISON:  MRI right knee 10/19/2022, right knee radiographs 10/18/2022 FINDINGS: Bones/Joint/Cartilage Normal bone mineralization. The cortices are intact. No acute fracture or dislocation. Ligaments Suboptimally assessed by CT. Muscles and Tendons Normal density and size of the regional musculature. No gross tendon tear is seen. Soft tissues There is diffuse moderate subcutaneous fat edema and swelling throughout the right thigh, greatest  within the posterior and lateral aspects, and greatest specifically at the lateral aspect of the knee where the fluid measures up to approximately 1.7 cm in transverse thickness (axial series 4, image 222 through 2056 and coronal series 8, image 57). No walled-off rim enhancing abscess is seen. No knee joint effusion. IMPRESSION: Diffuse moderate  subcutaneous fat edema and swelling throughout the right thigh, greatest within the posterior and lateral aspects, and greatest specifically at the lateral aspect of the knee. No walled-off rim enhancing abscess is seen. No evidence of acute osteomyelitis. Electronically Signed   By: Neita Garnet M.D.   On: 10/22/2022 13:33   CT TIBIA FIBULA RIGHT W CONTRAST  Result Date: 10/22/2022 CLINICAL DATA:  Cellulitis with concern for deep infection. Soft tissue infection suspected. EXAM: CT OF THE LOWER RIGHT EXTREMITY WITH CONTRAST TECHNIQUE: Multidetector CT imaging of the lower right extremity (right tibia and fibula) was performed according to the standard protocol following intravenous contrast administration. RADIATION DOSE REDUCTION: This exam was performed according to the departmental dose-optimization program which includes automated exposure control, adjustment of the mA and/or kV according to patient size and/or use of iterative reconstruction technique. CONTRAST:  75mL OMNIPAQUE IOHEXOL 350 MG/ML SOLN COMPARISON:  Right knee radiographs 03/13/2020 and 10/18/2022, MRI right knee 10/19/2022 FINDINGS: Bones/Joint/Cartilage Normal bone mineralization. The cortices are intact. No acute fracture or dislocation. Ligaments Suboptimally assessed by CT. Muscles and Tendons Normal size and density of the regional musculature. No tendon tear is seen. Soft tissues There is moderate subcutaneous fat edema and swelling throughout the visualized right calf and dorsal right foot. This is greatest at the dorsal aspect of the foot, medial and lateral aspects of the ankle, and the posteromedial aspect of the calf. No definite walled-off abscess is visualized, however the pooling fluid in between the subcutaneous fat and the distal medial and lateral heads of the gastrocnemius muscles at the proximal Achilles musculotendinous junction measures up to approximately 1.2 cm in AP thickness and 5.0 cm in transverse dimension (axial  series 4, image 127). At the level of the ankle the medial soft tissue swelling measures up to approximately 1.4 cm in the lateral soft tissue swelling measures up to approximately 1.1 cm. There is also lateral right knee subcutaneous fat swelling measuring up to approximately 1.7 cm extending off the superior plane of view. IMPRESSION: 1. Moderate subcutaneous fat edema and swelling throughout the visualized right calf and dorsal right foot. This is greatest at the dorsal aspect of the foot, medial and lateral aspects of the ankle, and the posteromedial aspect of the calf. No definite walled-off abscess is visualized. 2. No evidence osteomyelitis. Electronically Signed   By: Neita Garnet M.D.   On: 10/22/2022 13:22   - pertinent xrays, CT, MRI studies were reviewed and independently interpreted  Positive ROS: All other systems have been reviewed and were otherwise negative with the exception of those mentioned in the HPI and as above.  Physical Exam: General: Alert, no acute distress Psychiatric: Patient is competent for consent with normal mood and affect Lymphatic: No axillary or cervical lymphadenopathy Cardiovascular: No pedal edema Respiratory: No cyanosis, no use of accessory musculature GI: No organomegaly, abdomen is soft and non-tender    Images:  @ENCIMAGES @  Labs:  Lab Results  Component Value Date   HGBA1C 5.0 10/20/2022   ESRSEDRATE 26 (H) 10/18/2022   CRP 28.3 (H) 10/20/2022   CRP 34.2 (H) 10/18/2022   REPTSTATUS PENDING 10/22/2022   GRAMSTAIN NO WBC SEEN NO  ORGANISMS SEEN  10/22/2022   CULT  10/22/2022    NO GROWTH < 24 HOURS Performed at [facility], [address], [place], [place] [address]    [employer] STREPTOCOCCUS PYOGENES 10/18/2022    Lab Results  Component Value Date   ALBUMIN 2.0 (L) 10/22/2022   ALBUMIN 2.0 (L) 10/21/2022   ALBUMIN 2.2 (L) 10/20/2022        Latest Ref Rng & Units 10/22/2022    6:39 AM 10/21/2022    5:51 AM 10/20/2022     5:52 AM  CBC EXTENDED  WBC 4.0 - 10.5 K/uL 17.2  21.6  19.9   RBC 4.22 - 5.81 MIL/uL 3.83  3.75  3.74   Hemoglobin 13.0 - 17.0 g/dL 60.4  54.0  98.1   HCT 39.0 - 52.0 % 32.4  31.8  32.7   Platelets 150 - 400 K/uL 231  206  172   NEUT# 1.7 - 7.7 K/uL 12.2  17.6  18.5   Lymph# 0.7 - 4.0 K/uL 2.1  1.4  0.4     Neurologic: Patient does not have protective sensation bilateral lower extremities.   MUSCULOSKELETAL:   Skin: Examination patient has a wound VAC in place.  Review of the images shows cellulitis in the calf and thigh with blistering near the popliteal fossa posteriorly.  Patient has palpable pulses.  White cell count 17.2 with a hemoglobin 11.8.  Absolute neutrophil lymphocyte count 6.  Review of the CT scan of the right calf shows no definite abscess no osteomyelitis.  There is edema and swelling throughout the calf and dorsal foot.  CT scan of the right thigh shows subcutaneous edema throughout the thigh posteriorly.  No abscess or osteomyelitis.  Blood work shows group a strep pyogenes.  Assessment: Assessment: Necrotizing fasciitis right thigh, calf, foot.  Plan: Patient has undergone initial decompression.  Continue IV antibiotics I will plan to return to surgery on Wednesday and Friday with further extension of the incision and excision of the fascia.  Risks and benefits were discussed with the patient and family at bedside discussing the risk of mortality and limb loss.  Thank you for the consult and the opportunity to see Mr. Tim Corriher, MD [facility] [phone] 8:06 AM

## 2022-10-23 NOTE — H&P (View-Only) (Signed)
ORTHOPAEDIC CONSULTATION  REQUESTING PHYSICIAN: Zigmund Daniel., *  Chief complaint: Pain and swelling and blistering right leg.  HPI: Dean Nichols is a 27 y.o. male who presents with patient is a 27 year old gentleman who is seen for initial evaluation for necrotizing fasciitis right lower extremity.  Patient underwent initial debridement with Dr. Dion Saucier yesterday.  Initial blood cultures are showing group A strep pyogenes.  Patient reports a history of trauma resulting in open wounds to the right calf.  Past Medical History:  Diagnosis Date   ACL tear    right   Past Surgical History:  Procedure Laterality Date   APPLICATION OF WOUND VAC Right 10/22/2022   Procedure: APPLICATION OF WOUND VAC;  Surgeon: Teryl Lucy, MD;  Location: [facility] OR;  Service: Orthopedics;  Laterality: Right;   I & D EXTREMITY Right 10/22/2022   Procedure: IRRIGATION AND DEBRIDEMENT LEG RIGHT;  Surgeon: Teryl Lucy, MD;  Location: [facility] OR;  Service: Orthopedics;  Laterality: Right;   WISDOM TOOTH EXTRACTION     Social History   Socioeconomic History   Marital status: Single    Spouse name: Not on file   Number of children: Not on file   Years of education: Not on file   Highest education level: Not on file  Occupational History   Not on file  Tobacco Use   Smoking status: Never   Smokeless tobacco: Never  Vaping Use   Vaping status: Never Used  Substance and Sexual Activity   Alcohol use: Not Currently   Drug use: Yes    Types: Marijuana   Sexual activity: Not on file  Other Topics Concern   Not on file  Social History Narrative   Not on file   Social Determinants of Health   Financial Resource Strain: Not on file  Food Insecurity: No Food Insecurity (10/18/2022)   Hunger Vital Sign    Worried About Running Out of Food in the Last Year: Never true    Ran Out of Food in the Last Year: Never true  Transportation Needs: No Transportation Needs (10/18/2022)   PRAPARE -  Administrator, Civil Service (Medical): No    Lack of Transportation (Non-Medical): No  Physical Activity: Not on file  Stress: Not on file  Social Connections: Not on file   History reviewed. No pertinent family history. - negative except otherwise stated in the family history section No Known Allergies Prior to Admission medications   Not on File   PERIPHERAL VASCULAR CATHETERIZATION  Result Date: 10/22/2022 See surgical note for result.  CT FEMUR RIGHT W CONTRAST  Result Date: 10/22/2022 CLINICAL DATA:  Soft tissue infection suspected, thigh. Cellulitis with concern for deep infection. EXAM: CT OF THE LOWER RIGHT EXTREMITY WITH CONTRAST TECHNIQUE: Multidetector CT imaging of the lower right extremity was performed according to the standard protocol following intravenous contrast administration. RADIATION DOSE REDUCTION: This exam was performed according to the departmental dose-optimization program which includes automated exposure control, adjustment of the mA and/or kV according to patient size and/or use of iterative reconstruction technique. CONTRAST:  75mL OMNIPAQUE IOHEXOL 350 MG/ML SOLN COMPARISON:  MRI right knee 10/19/2022, right knee radiographs 10/18/2022 FINDINGS: Bones/Joint/Cartilage Normal bone mineralization. The cortices are intact. No acute fracture or dislocation. Ligaments Suboptimally assessed by CT. Muscles and Tendons Normal density and size of the regional musculature. No gross tendon tear is seen. Soft tissues There is diffuse moderate subcutaneous fat edema and swelling throughout the right thigh, greatest  within the posterior and lateral aspects, and greatest specifically at the lateral aspect of the knee where the fluid measures up to approximately 1.7 cm in transverse thickness (axial series 4, image 222 through 2056 and coronal series 8, image 57). No walled-off rim enhancing abscess is seen. No knee joint effusion. IMPRESSION: Diffuse moderate  subcutaneous fat edema and swelling throughout the right thigh, greatest within the posterior and lateral aspects, and greatest specifically at the lateral aspect of the knee. No walled-off rim enhancing abscess is seen. No evidence of acute osteomyelitis. Electronically Signed   By: Neita Garnet M.D.   On: 10/22/2022 13:33   CT TIBIA FIBULA RIGHT W CONTRAST  Result Date: 10/22/2022 CLINICAL DATA:  Cellulitis with concern for deep infection. Soft tissue infection suspected. EXAM: CT OF THE LOWER RIGHT EXTREMITY WITH CONTRAST TECHNIQUE: Multidetector CT imaging of the lower right extremity (right tibia and fibula) was performed according to the standard protocol following intravenous contrast administration. RADIATION DOSE REDUCTION: This exam was performed according to the departmental dose-optimization program which includes automated exposure control, adjustment of the mA and/or kV according to patient size and/or use of iterative reconstruction technique. CONTRAST:  75mL OMNIPAQUE IOHEXOL 350 MG/ML SOLN COMPARISON:  Right knee radiographs 03/13/2020 and 10/18/2022, MRI right knee 10/19/2022 FINDINGS: Bones/Joint/Cartilage Normal bone mineralization. The cortices are intact. No acute fracture or dislocation. Ligaments Suboptimally assessed by CT. Muscles and Tendons Normal size and density of the regional musculature. No tendon tear is seen. Soft tissues There is moderate subcutaneous fat edema and swelling throughout the visualized right calf and dorsal right foot. This is greatest at the dorsal aspect of the foot, medial and lateral aspects of the ankle, and the posteromedial aspect of the calf. No definite walled-off abscess is visualized, however the pooling fluid in between the subcutaneous fat and the distal medial and lateral heads of the gastrocnemius muscles at the proximal Achilles musculotendinous junction measures up to approximately 1.2 cm in AP thickness and 5.0 cm in transverse dimension (axial  series 4, image 127). At the level of the ankle the medial soft tissue swelling measures up to approximately 1.4 cm in the lateral soft tissue swelling measures up to approximately 1.1 cm. There is also lateral right knee subcutaneous fat swelling measuring up to approximately 1.7 cm extending off the superior plane of view. IMPRESSION: 1. Moderate subcutaneous fat edema and swelling throughout the visualized right calf and dorsal right foot. This is greatest at the dorsal aspect of the foot, medial and lateral aspects of the ankle, and the posteromedial aspect of the calf. No definite walled-off abscess is visualized. 2. No evidence osteomyelitis. Electronically Signed   By: Neita Garnet M.D.   On: 10/22/2022 13:22   - pertinent xrays, CT, MRI studies were reviewed and independently interpreted  Positive ROS: All other systems have been reviewed and were otherwise negative with the exception of those mentioned in the HPI and as above.  Physical Exam: General: Alert, no acute distress Psychiatric: Patient is competent for consent with normal mood and affect Lymphatic: No axillary or cervical lymphadenopathy Cardiovascular: No pedal edema Respiratory: No cyanosis, no use of accessory musculature GI: No organomegaly, abdomen is soft and non-tender    Images:  @ENCIMAGES @  Labs:  Lab Results  Component Value Date   HGBA1C 5.0 10/20/2022   ESRSEDRATE 26 (H) 10/18/2022   CRP 28.3 (H) 10/20/2022   CRP 34.2 (H) 10/18/2022   REPTSTATUS PENDING 10/22/2022   GRAMSTAIN NO WBC SEEN NO  ORGANISMS SEEN  10/22/2022   CULT  10/22/2022    NO GROWTH < 24 HOURS Performed at [facility], [address], [place], [place] [address]    [employer] STREPTOCOCCUS PYOGENES 10/18/2022    Lab Results  Component Value Date   ALBUMIN 2.0 (L) 10/22/2022   ALBUMIN 2.0 (L) 10/21/2022   ALBUMIN 2.2 (L) 10/20/2022        Latest Ref Rng & Units 10/22/2022    6:39 AM 10/21/2022    5:51 AM 10/20/2022     5:52 AM  CBC EXTENDED  WBC 4.0 - 10.5 K/uL 17.2  21.6  19.9   RBC 4.22 - 5.81 MIL/uL 3.83  3.75  3.74   Hemoglobin 13.0 - 17.0 g/dL 60.4  54.0  98.1   HCT 39.0 - 52.0 % 32.4  31.8  32.7   Platelets 150 - 400 K/uL 231  206  172   NEUT# 1.7 - 7.7 K/uL 12.2  17.6  18.5   Lymph# 0.7 - 4.0 K/uL 2.1  1.4  0.4     Neurologic: Patient does not have protective sensation bilateral lower extremities.   MUSCULOSKELETAL:   Skin: Examination patient has a wound VAC in place.  Review of the images shows cellulitis in the calf and thigh with blistering near the popliteal fossa posteriorly.  Patient has palpable pulses.  White cell count 17.2 with a hemoglobin 11.8.  Absolute neutrophil lymphocyte count 6.  Review of the CT scan of the right calf shows no definite abscess no osteomyelitis.  There is edema and swelling throughout the calf and dorsal foot.  CT scan of the right thigh shows subcutaneous edema throughout the thigh posteriorly.  No abscess or osteomyelitis.  Blood work shows group a strep pyogenes.  Assessment: Assessment: Necrotizing fasciitis right thigh, calf, foot.  Plan: Patient has undergone initial decompression.  Continue IV antibiotics I will plan to return to surgery on Wednesday and Friday with further extension of the incision and excision of the fascia.  Risks and benefits were discussed with the patient and family at bedside discussing the risk of mortality and limb loss.  Thank you for the consult and the opportunity to see Mr. Tim Corriher, MD [facility] [phone] 8:06 AM

## 2022-10-23 NOTE — TOC Progression Note (Signed)
Transition of Care [facility]) - Progression Note    Patient Details  Name: Dean Nichols MRN: [id number] Date of Birth: 1995/07/10  Transition of Care [facility]) CM/SW Contact  Janae Bridgeman, RN Phone Number: 10/23/2022, 4:10 PM  Clinical Narrative:    CM met with the patient at the bedside to discuss TOC needs.  The patient currently has a wound vac intact to Right leg at this time and is pending surgery with Dr. Lajoyce Corners on Wednesday and Friday.  I spoke with the patient and he plans to discharge home to his mother's home in [place], [place] after his surgery since she lives in a home and is available post-discharge to help with care - pending discharge address is [address], [place], [place].  The patient provided me permission to speak with Maurine Minister, RNCM with [employer] - worker's compensation company to assist with discharge planning as needed - considering patient may need home health RN - pending wound care determination post-surgery by Dr. Lajoyce Corners.  No other TOC needs at this time.   Expected Discharge Plan: Home/Self Care Barriers to Discharge: Continued Medical Work up  Expected Discharge Plan and Services   Discharge Planning Services: CM Consult   Living arrangements for the past 2 months: Single Family Home                                       Social Determinants of Health (SDOH) Interventions SDOH Screenings   Food Insecurity: No Food Insecurity (10/18/2022)  Housing: Patient Declined (10/18/2022)  Transportation Needs: No Transportation Needs (10/18/2022)  Utilities: Not At Risk (10/18/2022)  Tobacco Use: Low Risk  (10/22/2022)    Readmission Risk Interventions     No data to display

## 2022-10-23 NOTE — Progress Notes (Signed)
PROGRESS NOTE    Dean Nichols  ZOX:[id number] DOB: 08/09/1995 DOA: 10/18/2022 PCP: Pcp, No  Chief Complaint  Patient presents with   Knee Pain    Brief Narrative:   Dean Nichols is Dean Nichols 27 y.o. 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  He was working in the yard when he accidentally cut his leg around the lateral aspect of the right knee with Loryn Haacke metal pipe.  He presented to the ED from urgent care with fevers, chills, nausea, vomiting -> sepsis -> was admitted for sepsis due to right lower extremity cellulitis.  Assessment & Plan:   Principal Problem:   Sepsis due to cellulitis [facility]) Active Problems:   AKI (acute kidney injury) (HCC)   Hyponatremia   Necrotizing fasciitis due to Streptococcus pyogenes (HCC)  Sepsis due to Strep pyogenes Bacteremia Necrotizing Fasciitis of Right Lower Extremity  Met criteria for sepsis on presentation, continues to meet criteria for sepsis with tachycardia, fever, leukocytosis Fever 10/5 PM, leukocytosis persistent Strep pyogenes in 1/2 cultures from 10/2 Repeat cultures from 10/4 NGx2 MRI 10/3 with severe subcutaneous edema surrounding the knee, greatest laterally - c/w cellulitis - nonspecific small knee joint effusion Appreciate ortho recs - s/p R leg excisional debridement, necrotizing fasciitis of thigh and calf  Ortho planning to return to OR on 10/9 and 10/11 Surgical culture 10/6 pending  Penicillin and linezolid  CT 10/6 with diffuse moderate subcutaneous fat edema and swelling throughout the R thigh, greatest within the posterior and lateral aspects and greatest specifically at the alteral aspect of the knee - moderate subcutaneous fat edema and swelling throughout the visualized R calf and dorsal R foot (see report) ID c/s -> appreciate recs Repeat cultures 10/4 AM - NG Echocardiogram EF 55-60%, no  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 Shock liver RUQ [place] without concerning findings of liver and gallbladder Acute hepatitis panel  negative improving  Acute kidney Injury  Baseline creatinine unknown Suspect due to sepsis Follow renal US (normal), UA unrevealing  Hypokalemia Replace and follow    DVT prophylaxis: lovenox Code Status: full Family Communication: friend at bedside Disposition:   Status is: Inpatient Remains inpatient appropriate because: need for continued inpatient care   Consultants:  orthopedics  Procedures:  none  Antimicrobials:  Anti-infectives (From admission, onward)    Start     Dose/Rate Route Frequency Ordered Stop   10/20/22 2200  linezolid (ZYVOX) tablet 600 mg        600 mg Oral Every 12 hours 10/20/22 1018     10/19/22 1300  penicillin G potassium 12 Million Units in dextrose 5 % 500 mL CONTINUOUS infusion        12 Million Units 41.7 mL/hr over 12 Hours Intravenous Every 12 hours 10/19/22 1213     10/19/22 1300  linezolid (ZYVOX) IVPB 600 mg  Status:  Discontinued        600 mg 300 mL/hr over 60 Minutes Intravenous Every 12 hours 10/19/22 1213 10/20/22 1018   10/19/22 0600  vancomycin (VANCOCIN) IVPB 1000 mg/200 mL premix  Status:  Discontinued        1,000 mg 200 mL/hr over 60 Minutes Intravenous Every 12 hours 10/18/22 1734 10/19/22 1213   10/19/22 0000  ceFEPIme (MAXIPIME) 2 g in  sodium chloride 0.9 % 100 mL IVPB  Status:  Discontinued        2 g 200 mL/hr over 30 Minutes Intravenous Every 8 hours 10/18/22 1734 10/19/22 1213   10/18/22 2130  vancomycin (VANCOCIN) IVPB 1000 mg/200 mL premix  Status:  Discontinued        1,000 mg 200 mL/hr over 60 Minutes Intravenous  Once 10/18/22 2033 10/18/22 2047   10/18/22 1630  ceFEPIme (MAXIPIME) 2 g in sodium chloride 0.9 % 100 mL IVPB        2 g 200 mL/hr over 30 Minutes Intravenous   Once 10/18/22 1616 10/18/22 1820   10/18/22 1630  metroNIDAZOLE (FLAGYL) IVPB 500 mg        500 mg 100 mL/hr over 60 Minutes Intravenous  Once 10/18/22 1616 10/18/22 1819   10/18/22 1630  vancomycin (VANCOCIN) IVPB 1000 mg/200 mL premix        1,000 mg 200 mL/hr over 60 Minutes Intravenous  Once 10/18/22 1616 10/18/22 1924       Subjective: Pain is ok  No complaints today Mom at bedside, wonders about surgery time in the future  Objective: Vitals:   10/22/22 1628 10/22/22 2044 10/23/22 0614 10/23/22 0755  BP: 111/65 109/62 113/69 116/67  Pulse: 63 62 87 62  Resp: 19 19 18 15   Temp: 98.1 F (36.7 C) (!) 97.4 F (36.3 C) 98.8 F (37.1 C) 97.9 F (36.6 C)  TempSrc:  Oral Oral Oral  SpO2: 97% 98% 98% 98%  Weight:      Height:        Intake/Output Summary (Last 24 hours) at 10/23/2022 1636 Last data filed at 10/23/2022 1543 Gross per 24 hour  Intake 984.12 ml  Output 1700 ml  Net -715.88 ml   Filed Weights   10/18/22 1518 10/19/22 0350  Weight: 74.8 kg 75.1 kg    Examination:  General: No acute distress. Cardiovascular:RRR Lungs: unlabored Neurological: Alert and oriented 3. Moves all extremities 4 . Cranial nerves II through XII grossly intact. Skin: Warm and dry. No rashes or lesions. Extremities: RLE edema, dressing in place/wound vac - palpable distal pulses    Data Reviewed: I have personally reviewed following labs and imaging studies  CBC: Recent Labs  Lab 10/18/22 1527 10/18/22 2050 10/19/22 9629 10/20/22 0552 10/21/22 0551 10/22/22 0639 10/23/22 1008  WBC 17.9*   < > 11.9* 19.9* 21.6* 17.2* 18.9*  NEUTROABS 16.3*  --   --  18.5* 17.6* 12.2* 14.7*  HGB 16.2   < > 12.4* 12.0* 11.6* 11.8* 12.8*  HCT 44.9   < > 34.6* 32.7* 31.8* 32.4* 35.9*  MCV 86.3   < > 85.4 87.4 84.8 84.6 85.7  PLT 227   < > 155 172 206 231 298   < > = values in this interval not displayed.    Basic Metabolic Panel: Recent Labs  Lab 10/19/22 0633 10/20/22 0552  10/21/22 0551 10/22/22 0639 10/23/22 1008  NA 134* 130* 132* 135 136  K 3.7 3.5 3.3* 3.4* 4.3  CL 98 98 100 101 100  CO2 25 24 24 25 26   GLUCOSE 113* 112* 100* 100* 120*  BUN 17 14 12 10 12   CREATININE 1.47* 1.14 1.23 1.11 1.02  CALCIUM 8.0* 7.8* 7.6* 7.6* 8.1*  MG  --  1.8 1.8 1.9 2.3  PHOS  --  1.4* 2.7 3.4 3.2    GFR: Estimated Creatinine Clearance: 114.5 mL/min (by C-G formula based on SCr of 1.02 mg/dL).  Liver Function Tests: Recent Labs  Lab 10/19/22 [phone] 10/20/22 0552 10/21/22 0551 10/22/22 0639 10/23/22 1008  AST 48* 406* 157* 135* 67*  ALT 36 271* 184* 182* 162*  ALKPHOS 54 75 105 183* 191*  BILITOT 1.1 1.1 1.2 0.9 0.5  PROT 5.4* 5.1* 5.1* 5.3* 5.8*  ALBUMIN 2.5* 2.2* 2.0* 2.0* 2.1*    CBG: No results for input(s): "GLUCAP" in the last 168 hours.   Recent Results (from the past 240 hour(s))  Culture, blood (Routine x 2)     Status: Abnormal   Collection Time: 10/18/22  3:26 PM   Specimen: BLOOD LEFT ARM  Result Value Ref Range Status   Specimen Description   Final    BLOOD LEFT ARM Performed at [facility], [address], [place], [place] [address]    Special Requests   Final    BOTTLES DRAWN AEROBIC AND ANAEROBIC Blood Culture adequate volume Performed at [facility], [address], [place], [place] [address]    Culture  Setup Time   Final    GRAM POSITIVE COCCI IN CHAINS ANAEROBIC BOTTLE ONLY CRITICAL RESULT CALLED TO, READ BACK BY AND VERIFIED WITH: PHARMD BAILEY Balbina Depace 1153 FCP    Culture (Bethzy Hauck)  Final    STREPTOCOCCUS PYOGENES HEALTH DEPARTMENT NOTIFIED Performed at [facility], [address], [place], [place] [address]    Report Status 10/21/2022 FINAL  Final   Organism ID, Bacteria STREPTOCOCCUS PYOGENES  Final      Susceptibility   Streptococcus pyogenes - MIC*    PENICILLIN <=0.06 SENSITIVE Sensitive     CEFTRIAXONE <=0.12 SENSITIVE Sensitive     ERYTHROMYCIN <=0.12 SENSITIVE Sensitive      LEVOFLOXACIN 0.5 SENSITIVE Sensitive     VANCOMYCIN 0.25 SENSITIVE Sensitive     * STREPTOCOCCUS PYOGENES  Blood Culture ID Panel (Reflexed)     Status: Abnormal   Collection Time: 10/18/22  3:26 PM  Result Value Ref Range Status   Enterococcus faecalis NOT DETECTED NOT DETECTED Final   Enterococcus Faecium NOT DETECTED NOT DETECTED Final   Listeria monocytogenes NOT DETECTED NOT DETECTED Final   Staphylococcus species NOT DETECTED NOT DETECTED Final   Staphylococcus aureus (BCID) NOT DETECTED NOT DETECTED Final   Staphylococcus epidermidis NOT DETECTED NOT DETECTED Final   Staphylococcus lugdunensis NOT DETECTED NOT DETECTED Final   Streptococcus species DETECTED (Kieryn Burtis) NOT DETECTED Final    Comment: CRITICAL RESULT CALLED TO, READ BACK BY AND VERIFIED WITH: PHARMD BAILEY Zahira Brummond 1153 FCP    Streptococcus agalactiae NOT DETECTED NOT DETECTED Final   Streptococcus pneumoniae NOT DETECTED NOT DETECTED Final   Streptococcus pyogenes DETECTED (Ludy Messamore) NOT DETECTED Final    Comment: CRITICAL RESULT CALLED TO, READ BACK BY AND VERIFIED WITH: PHARMD BAILEY Cypress Fanfan 1153 FCP    Akiko Schexnider.calcoaceticus-baumannii NOT DETECTED NOT DETECTED Final   Bacteroides fragilis NOT DETECTED NOT DETECTED Final   Enterobacterales NOT DETECTED NOT DETECTED Final   Enterobacter cloacae complex NOT DETECTED NOT DETECTED Final   Escherichia coli NOT DETECTED NOT DETECTED Final   Klebsiella aerogenes NOT DETECTED NOT DETECTED Final   Klebsiella oxytoca NOT DETECTED NOT DETECTED Final   Klebsiella pneumoniae NOT DETECTED NOT DETECTED Final   Proteus species NOT DETECTED NOT DETECTED Final   Salmonella species NOT DETECTED NOT DETECTED Final   Serratia marcescens NOT DETECTED NOT DETECTED Final   Haemophilus influenzae NOT DETECTED NOT DETECTED Final   Neisseria meningitidis NOT DETECTED NOT DETECTED Final   Pseudomonas aeruginosa NOT  DETECTED NOT DETECTED Final   Stenotrophomonas maltophilia NOT DETECTED NOT DETECTED Final   Candida  albicans NOT DETECTED NOT DETECTED Final   Candida auris NOT DETECTED NOT DETECTED Final   Candida glabrata NOT DETECTED NOT DETECTED Final   Candida krusei NOT DETECTED NOT DETECTED Final   Candida parapsilosis NOT DETECTED NOT DETECTED Final   Candida tropicalis NOT DETECTED NOT DETECTED Final   Cryptococcus neoformans/gattii NOT DETECTED NOT DETECTED Final    Comment: Performed at [facility], [address], [place], [place] [address]  Culture, blood (Routine x 2)     Status: None   Collection Time: 10/18/22  3:31 PM   Specimen: BLOOD  Result Value Ref Range Status   Specimen Description   Final    BLOOD LEFT ANTECUBITAL Performed at [facility], [address] [place], [place] [address]    Special Requests   Final    BOTTLES DRAWN AEROBIC AND ANAEROBIC Blood Culture adequate volume Performed at [facility], [address], [place], [place] [address]    Culture   Final    NO GROWTH 5 DAYS Performed at [facility], [address] [place], [place] [address]    Report Status 10/23/2022 FINAL  Final  Culture, blood (Routine X 2) w Reflex to ID Panel     Status: None (Preliminary result)   Collection Time: 10/20/22  5:52 AM   Specimen: BLOOD  Result Value Ref Range Status   Specimen Description BLOOD BLOOD RIGHT ARM  Final   Special Requests   Final    BOTTLES DRAWN AEROBIC AND ANAEROBIC Blood Culture adequate volume   Culture   Final    NO GROWTH 3 DAYS Performed at [facility], [address] [place], [place] [address]    Report Status PENDING  Incomplete  Culture, blood (Routine X 2) w Reflex to ID Panel     Status: None (Preliminary result)   Collection Time: 10/20/22  5:57 AM   Specimen: BLOOD  Result Value Ref Range Status   Specimen Description BLOOD BLOOD RIGHT ARM  Final   Special Requests   Final    BOTTLES DRAWN AEROBIC AND ANAEROBIC Blood Culture adequate volume   Culture   Final    NO GROWTH 3  DAYS Performed at [facility], [address], [place], [place] [address]    Report Status PENDING  Incomplete  Aerobic/Anaerobic Culture w Gram Stain (surgical/deep wound)     Status: None (Preliminary result)   Collection Time: 10/22/22  2:51 PM   Specimen: Soft Tissue, Other  Result Value Ref Range Status   Specimen Description TISSUE  Final   Special Requests right gastroc fascia  Final   Gram Stain NO WBC SEEN NO ORGANISMS SEEN   Final   Culture   Final    NO GROWTH < 24 HOURS Performed at [facility], [address], [place], [place] [address]    Report Status PENDING  Incomplete         Radiology Studies: PERIPHERAL VASCULAR CATHETERIZATION  Result Date: 10/22/2022 See surgical note for result.  CT FEMUR RIGHT W CONTRAST  Result Date: 10/22/2022 CLINICAL DATA:  Soft tissue infection suspected, thigh. Cellulitis with concern for deep infection. EXAM: CT OF THE LOWER RIGHT EXTREMITY WITH CONTRAST TECHNIQUE: Multidetector CT imaging of the lower right extremity was performed according to the standard protocol following intravenous contrast administration. RADIATION DOSE REDUCTION: This exam was performed  according to the departmental dose-optimization program which includes automated exposure control, adjustment of the mA and/or kV according to patient size and/or use of iterative reconstruction technique. CONTRAST:  75mL OMNIPAQUE IOHEXOL 350 MG/ML SOLN COMPARISON:  MRI right knee 10/19/2022, right knee radiographs 10/18/2022 FINDINGS: Bones/Joint/Cartilage Normal bone mineralization. The cortices are intact. No acute fracture or dislocation. Ligaments Suboptimally assessed by CT. Muscles and Tendons Normal density and size of the regional musculature. No gross tendon tear is seen. Soft tissues There is diffuse moderate subcutaneous fat edema and swelling throughout the right thigh, greatest within the posterior and lateral aspects, and greatest specifically at the  lateral aspect of the knee where the fluid measures up to approximately 1.7 cm in transverse thickness (axial series 4, image 222 through 2056 and coronal series 8, image 57). No walled-off rim enhancing abscess is seen. No knee joint effusion. IMPRESSION: Diffuse moderate subcutaneous fat edema and swelling throughout the right thigh, greatest within the posterior and lateral aspects, and greatest specifically at the lateral aspect of the knee. No walled-off rim enhancing abscess is seen. No evidence of acute osteomyelitis. Electronically Signed   By: Neita Garnet M.D.   On: 10/22/2022 13:33   CT TIBIA FIBULA RIGHT W CONTRAST  Result Date: 10/22/2022 CLINICAL DATA:  Cellulitis with concern for deep infection. Soft tissue infection suspected. EXAM: CT OF THE LOWER RIGHT EXTREMITY WITH CONTRAST TECHNIQUE: Multidetector CT imaging of the lower right extremity (right tibia and fibula) was performed according to the standard protocol following intravenous contrast administration. RADIATION DOSE REDUCTION: This exam was performed according to the departmental dose-optimization program which includes automated exposure control, adjustment of the mA and/or kV according to patient size and/or use of iterative reconstruction technique. CONTRAST:  75mL OMNIPAQUE IOHEXOL 350 MG/ML SOLN COMPARISON:  Right knee radiographs 03/13/2020 and 10/18/2022, MRI right knee 10/19/2022 FINDINGS: Bones/Joint/Cartilage Normal bone mineralization. The cortices are intact. No acute fracture or dislocation. Ligaments Suboptimally assessed by CT. Muscles and Tendons Normal size and density of the regional musculature. No tendon tear is seen. Soft tissues There is moderate subcutaneous fat edema and swelling throughout the visualized right calf and dorsal right foot. This is greatest at the dorsal aspect of the foot, medial and lateral aspects of the ankle, and the posteromedial aspect of the calf. No definite walled-off abscess is  visualized, however the pooling fluid in between the subcutaneous fat and the distal medial and lateral heads of the gastrocnemius muscles at the proximal Achilles musculotendinous junction measures up to approximately 1.2 cm in AP thickness and 5.0 cm in transverse dimension (axial series 4, image 127). At the level of the ankle the medial soft tissue swelling measures up to approximately 1.4 cm in the lateral soft tissue swelling measures up to approximately 1.1 cm. There is also lateral right knee subcutaneous fat swelling measuring up to approximately 1.7 cm extending off the superior plane of view. IMPRESSION: 1. Moderate subcutaneous fat edema and swelling throughout the visualized right calf and dorsal right foot. This is greatest at the dorsal aspect of the foot, medial and lateral aspects of the ankle, and the posteromedial aspect of the calf. No definite walled-off abscess is visualized. 2. No evidence osteomyelitis. Electronically Signed   By: Neita Garnet M.D.   On: 10/22/2022 13:22        Scheduled Meds:  acetaminophen  1,000 mg Oral Q8H   docusate sodium  100 mg Oral BID   enoxaparin (LOVENOX) injection  40 mg Subcutaneous Q24H  linezolid  600 mg Oral Q12H   Continuous Infusions:  methocarbamol (ROBAXIN) IV     penicillin G potassium 12 Million Units in dextrose 5 % 500 mL CONTINUOUS infusion 12 Million Units (10/23/22 0359)     LOS: 5 days    Time spent: over 30 min 40 min critical care time with concern for deep tissue infection in patient with strep pyogenes bacteremia   Lacretia Nicks, MD [facility]   To contact the attending provider between 7A-7P or the covering provider during after hours 7P-7A, please log into the web site [email or web address] and access using universal [facility] password for that web site. If you do not have the password, please call the hospital operator.  10/23/2022, 4:36 PM

## 2022-10-23 NOTE — Plan of Care (Signed)

## 2022-10-24 DIAGNOSIS — L0390 Cellulitis, unspecified: Secondary | ICD-10-CM | POA: Diagnosis not present

## 2022-10-24 DIAGNOSIS — A419 Sepsis, unspecified organism: Secondary | ICD-10-CM | POA: Diagnosis not present

## 2022-10-24 LAB — COMPREHENSIVE METABOLIC PANEL
ALT: 152 U/L — ABNORMAL HIGH (ref 0–44)
AST: 84 U/L — ABNORMAL HIGH (ref 15–41)
Albumin: 2.1 g/dL — ABNORMAL LOW (ref 3.5–5.0)
Alkaline Phosphatase: 165 U/L — ABNORMAL HIGH (ref 38–126)
Anion gap: 9 (ref 5–15)
BUN: 15 mg/dL (ref 6–20)
CO2: 27 mmol/L (ref 22–32)
Calcium: 8 mg/dL — ABNORMAL LOW (ref 8.9–10.3)
Chloride: 102 mmol/L (ref 98–111)
Creatinine, Ser: 1.01 mg/dL (ref 0.61–1.24)
GFR, Estimated: >60 mL/min (ref >60)
Glucose, Bld: 94 mg/dL (ref 70–99)
Potassium: 4 mmol/L (ref 3.5–5.1)
Sodium: 138 mmol/L (ref 135–145)
Total Bilirubin: 0.4 mg/dL (ref 0.3–1.2)
Total Protein: 5.3 g/dL — ABNORMAL LOW (ref 6.5–8.1)

## 2022-10-24 LAB — CBC
HCT: 32.2 % — ABNORMAL LOW (ref 39.0–52.0)
Hemoglobin: 11.4 g/dL — ABNORMAL LOW (ref 13.0–17.0)
MCH: 30.8 pg (ref 26.0–34.0)
MCHC: 35.4 g/dL (ref 30.0–36.0)
MCV: 87 fL (ref 80.0–100.0)
Platelets: 296 10*3/uL (ref 150–400)
RBC: 3.7 MIL/uL — ABNORMAL LOW (ref 4.22–5.81)
RDW: 13.2 % (ref 11.5–15.5)
WBC: 13.6 10*3/uL — ABNORMAL HIGH (ref 4.0–10.5)
nRBC: 0 % (ref 0.0–0.2)

## 2022-10-24 LAB — PHOSPHORUS: Phosphorus: 3.6 mg/dL (ref 2.5–4.6)

## 2022-10-24 LAB — MAGNESIUM: Magnesium: 2 mg/dL (ref 1.7–2.4)

## 2022-10-24 MED ORDER — CHLORHEXIDINE GLUCONATE 4 % EX SOLN: 60.0000 mL | Freq: Once | CUTANEOUS | Status: DC

## 2022-10-24 MED ORDER — POLYETHYLENE GLYCOL 3350 17 G PO PACK
17.0000 g | PACK | Freq: Two times a day (BID) | ORAL | Status: DC
  Filled 2022-10-24 (×2): qty 1

## 2022-10-24 MED ORDER — POVIDONE-IODINE 10 % EX SWAB
2.0000 | Freq: Once | CUTANEOUS | Status: AC
  Administered 2022-10-25: 2 via TOPICAL

## 2022-10-24 MED ORDER — CEFAZOLIN SODIUM-DEXTROSE 2-4 GM/100ML-% IV SOLN
2.0000 g | INTRAVENOUS | Status: AC
  Administered 2022-10-25: 2 g via INTRAVENOUS
  Filled 2022-10-24: qty 100

## 2022-10-24 NOTE — Progress Notes (Signed)
      INFECTIOUS DISEASE ATTENDING ADDENDUM:   Date: 10/24/2022  Patient name: Dean Nichols  Medical record number: [id number]  Date of birth: [birth year]   This patient has been seen and discussed with the NP student.  Please see the NP student's note for complete details. I have reviewed the pertinent HPI, Past medical, surgical, family, social history, ROS, allergies, medications, pertinent laboratory and radiographic data and examined the patient independently.  I concur with their findings with the following additions/corrections:  He is doing well on penicillin and Zyvox.  He is scheduled for the OR tomorrow.  Will likely get rid of his Zyvox after surgery tomorrow.  I have personally spent 50 minutes involved in face-to-face and non-face-to-face activities for this patient on the day of the visit. Professional time spent includes the following activities: Preparing to see the patient (review of tests), Obtaining and/or reviewing separately obtained history (admission/discharge record), Performing a medically appropriate examination and/or evaluation , Ordering medications/tests/procedures, referring and communicating with other health care professionals, Documenting clinical information in the EMR, Independently interpreting results (not separately reported), Communicating results to the patient/family/caregiver, Counseling and educating the patient/family/caregiver and Care coordination (not separately reported).    Acey Lav 10/24/2022, 4:52 PM

## 2022-10-24 NOTE — Progress Notes (Signed)
PROGRESS NOTE    Dean [place]  ZOX:[id number] DOB: 1995-07-29 DOA: 10/18/2022 PCP: Pcp, No  Chief Complaint  Patient presents with   Knee Pain    Brief Narrative:   Dean [place] is [place] 27 y.o. 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  He was working in the yard when he accidentally cut his leg around the lateral aspect of the right knee with Jlyn Bracamonte metal pipe.  He presented to the ED from urgent care with fevers, chills, nausea, vomiting -> sepsis -> was admitted for sepsis due to right lower extremity cellulitis.  Found to have necrotizing fasciitis.  ID and ortho following.  Plan for OR 10/9 and 10/11 (s/p R leg debridement on 10/6).  Assessment & Plan:   Principal Problem:   Sepsis due to cellulitis [facility]) Active Problems:   AKI (acute kidney injury) (HCC)   Hyponatremia   Necrotizing fasciitis due to Streptococcus pyogenes (HCC)   Sepsis due to group Kymoni Monday Streptococcus with acute organ dysfunction and septic shock (HCC)  Sepsis due to Strep pyogenes Bacteremia Necrotizing Fasciitis of Right Lower Extremity  Met criteria for sepsis on presentation, continues to meet criteria for sepsis with tachycardia, fever, leukocytosis Fever 10/5 PM, leukocytosis persistent Strep pyogenes in 1/2 cultures from 10/2 Repeat cultures from 10/4 NGx2 MRI 10/3 with severe subcutaneous edema surrounding the knee, greatest laterally - c/w cellulitis - nonspecific small knee joint effusion Appreciate ortho recs - s/p R leg excisional debridement, necrotizing fasciitis of thigh and calf  Ortho planning to return to OR on 10/9 and 10/11 Surgical culture 10/6 Ngx2 days  Penicillin and linezolid (per ID, likely to d/c zyvox after surgery tomorrow) CT 10/6 with diffuse moderate subcutaneous fat edema and swelling throughout the R thigh, greatest within the posterior and lateral  aspects and greatest specifically at the alteral aspect of the knee - moderate subcutaneous fat edema and swelling throughout the visualized R calf and dorsal R foot (see report) ID c/s -> appreciate recs Repeat cultures 10/4 AM - NG Echocardiogram EF 55-60%, no 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 Shock liver RUQ [place] without concerning findings of liver and gallbladder Acute hepatitis panel  negative Fluctuating, monitor  Acute kidney Injury  Baseline creatinine unknown Suspect due to sepsis Follow renal US (normal), UA unrevealing  Hypokalemia Replace and follow    DVT prophylaxis: lovenox Code Status: full Family Communication: friend at bedside Disposition:   Status is: Inpatient Remains inpatient appropriate because: need for continued inpatient care   Consultants:  orthopedics  Procedures:  none  Antimicrobials:  Anti-infectives (From admission, onward)    Start     Dose/Rate Route Frequency Ordered Stop   10/25/22 0600  ceFAZolin (ANCEF) IVPB 2g/100 mL premix        2 g 200 mL/hr over 30 Minutes Intravenous On call to O.R. 10/24/22 1617 10/26/22 0559   10/20/22 2200  linezolid (ZYVOX) tablet 600 mg        600 mg Oral Every 12 hours 10/20/22 1018     10/19/22 1300  penicillin G potassium 12 Million Units in dextrose 5 % 500 mL CONTINUOUS infusion        12 Million Units 41.7 mL/hr over 12 Hours Intravenous Every 12 hours 10/19/22 1213  10/19/22 1300  linezolid (ZYVOX) IVPB 600 mg  Status:  Discontinued        600 mg 300 mL/hr over 60 Minutes Intravenous Every 12 hours 10/19/22 1213 10/20/22 1018   10/19/22 0600  vancomycin (VANCOCIN) IVPB 1000 mg/200 mL premix  Status:  Discontinued        1,000 mg 200 mL/hr over 60 Minutes Intravenous Every 12 hours 10/18/22 1734 10/19/22 1213   10/19/22 0000  ceFEPIme (MAXIPIME) 2 g in sodium chloride 0.9 % 100 mL  IVPB  Status:  Discontinued        2 g 200 mL/hr over 30 Minutes Intravenous Every 8 hours 10/18/22 1734 10/19/22 1213   10/18/22 2130  vancomycin (VANCOCIN) IVPB 1000 mg/200 mL premix  Status:  Discontinued        1,000 mg 200 mL/hr over 60 Minutes Intravenous  Once 10/18/22 2033 10/18/22 2047   10/18/22 1630  ceFEPIme (MAXIPIME) 2 g in sodium chloride 0.9 % 100 mL IVPB        2 g 200 mL/hr over 30 Minutes Intravenous  Once 10/18/22 1616 10/18/22 1820   10/18/22 1630  metroNIDAZOLE (FLAGYL) IVPB 500 mg        500 mg 100 mL/hr over 60 Minutes Intravenous  Once 10/18/22 1616 10/18/22 1819   10/18/22 1630  vancomycin (VANCOCIN) IVPB 1000 mg/200 mL premix        1,000 mg 200 mL/hr over 60 Minutes Intravenous  Once 10/18/22 1616 10/18/22 1924       Subjective: No new complaints Mom on phone, sister at bedside  Objective: Vitals:   10/23/22 2045 10/24/22 0442 10/24/22 0806 10/24/22 1526  BP: 113/65 113/73 113/66 115/65  Pulse: 61 64 (!) 57 71  Resp: 18 18  18   Temp: 97.8 F (36.6 C) 98.3 F (36.8 C) 97.8 F (36.6 C) 98 F (36.7 C)  TempSrc: Oral Oral Oral Oral  SpO2: 99% 99% 99% 98%  Weight:      Height:        Intake/Output Summary (Last 24 hours) at 10/24/2022 1937 Last data filed at 10/24/2022 1517 Gross per 24 hour  Intake 1480.8 ml  Output 2875 ml  Net -1394.2 ml   Filed Weights   10/18/22 1518 10/19/22 0350  Weight: 74.8 kg 75.1 kg    Examination:  General: No acute distress. Cardiovascular: RRR Lungs: unlabored Abdomen: Soft, nontender, nondistended  Neurological: Alert and oriented 3. Moves all extremities 4 with equal strength. Cranial nerves II through XII grossly intact. Extremities: RLE edema, dressing    Data Reviewed: I have personally reviewed following labs and imaging studies  CBC: Recent Labs  Lab 10/18/22 1527 10/18/22 2050 10/20/22 0552 10/21/22 0551 10/22/22 0639 10/23/22 1008 10/24/22 0522  WBC 17.9*   < > 19.9* 21.6* 17.2*  18.9* 13.6*  NEUTROABS 16.3*  --  18.5* 17.6* 12.2* 14.7*  --   HGB 16.2   < > 12.0* 11.6* 11.8* 12.8* 11.4*  HCT 44.9   < > 32.7* 31.8* 32.4* 35.9* 32.2*  MCV 86.3   < > 87.4 84.8 84.6 85.7 87.0  PLT 227   < > 172 206 231 298 296   < > = values in this interval not displayed.    Basic Metabolic Panel: Recent Labs  Lab 10/20/22 0552 10/21/22 0551 10/22/22 0639 10/23/22 1008 10/24/22 0522  NA 130* 132* 135 136 138  K 3.5 3.3* 3.4* 4.3 4.0  CL 98 100 101 100 102  CO2 24 24 25  26 27  GLUCOSE 112* 100* 100* 120* 94  BUN 14 12 10 12 15   CREATININE 1.14 1.23 1.11 1.02 1.01  CALCIUM 7.8* 7.6* 7.6* 8.1* 8.0*  MG 1.8 1.8 1.9 2.3 2.0  PHOS 1.4* 2.7 3.4 3.2 3.6    GFR: Estimated Creatinine Clearance: 115.7 mL/min (by C-G formula based on SCr of 1.01 mg/dL).  Liver Function Tests: Recent Labs  Lab 10/20/22 0552 10/21/22 0551 10/22/22 0639 10/23/22 1008 10/24/22 0522  AST 406* 157* 135* 67* 84*  ALT 271* 184* 182* 162* 152*  ALKPHOS 75 105 183* 191* 165*  BILITOT 1.1 1.2 0.9 0.5 0.4  PROT 5.1* 5.1* 5.3* 5.8* 5.3*  ALBUMIN 2.2* 2.0* 2.0* 2.1* 2.1*    CBG: No results for input(s): "GLUCAP" in the last 168 hours.   Recent Results (from the past 240 hour(s))  Culture, blood (Routine x 2)     Status: Abnormal   Collection Time: 10/18/22  3:26 PM   Specimen: BLOOD LEFT ARM  Result Value Ref Range Status   Specimen Description   Final    BLOOD LEFT ARM Performed at [facility], [address], [place], [place] [address]    Special Requests   Final    BOTTLES DRAWN AEROBIC AND ANAEROBIC Blood Culture adequate volume Performed at [facility], [address], [place], [place] [address]    Culture  Setup Time   Final    GRAM POSITIVE COCCI IN CHAINS ANAEROBIC BOTTLE ONLY CRITICAL RESULT CALLED TO, READ BACK BY AND VERIFIED WITH: PHARMD BAILEY Iness Pangilinan 1153 FCP    Culture (Glennie Rodda)  Final    STREPTOCOCCUS PYOGENES HEALTH DEPARTMENT NOTIFIED Performed at  [facility], [address], [place], [place] [address]    Report Status 10/21/2022 FINAL  Final   Organism ID, Bacteria STREPTOCOCCUS PYOGENES  Final      Susceptibility   Streptococcus pyogenes - MIC*    PENICILLIN <=0.06 SENSITIVE Sensitive     CEFTRIAXONE <=0.12 SENSITIVE Sensitive     ERYTHROMYCIN <=0.12 SENSITIVE Sensitive     LEVOFLOXACIN 0.5 SENSITIVE Sensitive     VANCOMYCIN 0.25 SENSITIVE Sensitive     * STREPTOCOCCUS PYOGENES  Blood Culture ID Panel (Reflexed)     Status: Abnormal   Collection Time: 10/18/22  3:26 PM  Result Value Ref Range Status   Enterococcus faecalis NOT DETECTED NOT DETECTED Final   Enterococcus Faecium NOT DETECTED NOT DETECTED Final   Listeria monocytogenes NOT DETECTED NOT DETECTED Final   Staphylococcus species NOT DETECTED NOT DETECTED Final   Staphylococcus aureus (BCID) NOT DETECTED NOT DETECTED Final   Staphylococcus epidermidis NOT DETECTED NOT DETECTED Final   Staphylococcus lugdunensis NOT DETECTED NOT DETECTED Final   Streptococcus species DETECTED (Klara Stjames) NOT DETECTED Final    Comment: CRITICAL RESULT CALLED TO, READ BACK BY AND VERIFIED WITH: PHARMD BAILEY Roanne Haye 1153 FCP    Streptococcus agalactiae NOT DETECTED NOT DETECTED Final   Streptococcus pneumoniae NOT DETECTED NOT DETECTED Final   Streptococcus pyogenes DETECTED (Nakisha Chai) NOT DETECTED Final    Comment: CRITICAL RESULT CALLED TO, READ BACK BY AND VERIFIED WITH: PHARMD BAILEY Sehaj Mcenroe 1153 FCP    Adria Costley.calcoaceticus-baumannii NOT DETECTED NOT DETECTED Final   Bacteroides fragilis NOT DETECTED NOT DETECTED Final   Enterobacterales NOT DETECTED NOT DETECTED Final   Enterobacter cloacae complex NOT DETECTED NOT DETECTED Final   Escherichia coli NOT DETECTED NOT DETECTED Final   Klebsiella aerogenes NOT DETECTED NOT DETECTED Final   Klebsiella oxytoca NOT  DETECTED NOT DETECTED Final   Klebsiella pneumoniae NOT DETECTED NOT DETECTED Final   Proteus species NOT DETECTED NOT DETECTED Final    Salmonella species NOT DETECTED NOT DETECTED Final   Serratia marcescens NOT DETECTED NOT DETECTED Final   Haemophilus influenzae NOT DETECTED NOT DETECTED Final   Neisseria meningitidis NOT DETECTED NOT DETECTED Final   Pseudomonas aeruginosa NOT DETECTED NOT DETECTED Final   Stenotrophomonas maltophilia NOT DETECTED NOT DETECTED Final   Candida albicans NOT DETECTED NOT DETECTED Final   Candida auris NOT DETECTED NOT DETECTED Final   Candida glabrata NOT DETECTED NOT DETECTED Final   Candida krusei NOT DETECTED NOT DETECTED Final   Candida parapsilosis NOT DETECTED NOT DETECTED Final   Candida tropicalis NOT DETECTED NOT DETECTED Final   Cryptococcus neoformans/gattii NOT DETECTED NOT DETECTED Final    Comment: Performed at [facility], [address], [place], [place] [address]  Culture, blood (Routine x 2)     Status: None   Collection Time: 10/18/22  3:31 PM   Specimen: BLOOD  Result Value Ref Range Status   Specimen Description   Final    BLOOD LEFT ANTECUBITAL Performed at [facility], [address] [place], [place] [address]    Special Requests   Final    BOTTLES DRAWN AEROBIC AND ANAEROBIC Blood Culture adequate volume Performed at [facility], [address], [place], [place] [address]    Culture   Final    NO GROWTH 5 DAYS Performed at [facility], [address] [place], [place] [address]    Report Status 10/23/2022 FINAL  Final  Culture, blood (Routine X 2) w Reflex to ID Panel     Status: None (Preliminary result)   Collection Time: 10/20/22  5:52 AM   Specimen: BLOOD  Result Value Ref Range Status   Specimen Description BLOOD BLOOD RIGHT ARM  Final   Special Requests   Final    BOTTLES DRAWN AEROBIC AND ANAEROBIC Blood Culture adequate volume   Culture   Final    NO GROWTH 4 DAYS Performed at [facility], [address] [place], [place] [address]    Report Status PENDING  Incomplete  Culture,  blood (Routine X 2) w Reflex to ID Panel     Status: None (Preliminary result)   Collection Time: 10/20/22  5:57 AM   Specimen: BLOOD  Result Value Ref Range Status   Specimen Description BLOOD BLOOD RIGHT ARM  Final   Special Requests   Final    BOTTLES DRAWN AEROBIC AND ANAEROBIC Blood Culture adequate volume   Culture   Final    NO GROWTH 4 DAYS Performed at [facility], [address], [place], [place] [address]    Report Status PENDING  Incomplete  Aerobic/Anaerobic Culture w Gram Stain (surgical/deep wound)     Status: None (Preliminary result)   Collection Time: 10/22/22  2:51 PM   Specimen: Soft Tissue, Other  Result Value Ref Range Status   Specimen Description TISSUE  Final   Special Requests right gastroc fascia  Final   Gram Stain NO WBC SEEN NO ORGANISMS SEEN   Final   Culture   Final    NO GROWTH 2 DAYS NO ANAEROBES ISOLATED; CULTURE IN PROGRESS FOR 5 DAYS Performed at [facility], [address] [place], [place] [address]    Report Status PENDING  Incomplete         Radiology Studies: No  results found.      Scheduled Meds:  acetaminophen  1,000 mg Oral Q8H   chlorhexidine  60 mL Topical Once   docusate sodium  100 mg Oral BID   enoxaparin (LOVENOX) injection  40 mg Subcutaneous Q24H   linezolid  600 mg Oral Q12H   pantoprazole  40 mg Oral Daily   polyethylene glycol  17 g Oral BID   [START ON 10/25/2022] povidone-iodine  2 Application Topical Once   Continuous Infusions:  [START ON 10/25/2022]  ceFAZolin (ANCEF) IV     methocarbamol (ROBAXIN) IV     penicillin G potassium 12 Million Units in dextrose 5 % 500 mL CONTINUOUS infusion 12 Million Units (10/24/22 0725)     LOS: 6 days    Time spent: over 30 min   Lacretia Nicks, MD [facility]   To contact the attending provider between 7A-7P or the covering provider during after hours 7P-7A, please log into the web site [email or web address] and access using universal [place]  password for that web site. If you do not have the password, please call the hospital operator.  10/24/2022, 7:37 PM

## 2022-10-24 NOTE — Plan of Care (Signed)
  Problem: Clinical Measurements: Goal: Ability to avoid or minimize complications of infection will improve Outcome: Progressing   Problem: Clinical Measurements: Goal: Ability to avoid or minimize complications of infection will improve Outcome: Progressing

## 2022-10-25 ENCOUNTER — Other Ambulatory Visit: Payer: Self-pay

## 2022-10-25 ENCOUNTER — Encounter (HOSPITAL_COMMUNITY)
Admission: EM | Disposition: A | Discharge status: Home Health | Payer: Self-pay | Source: Home / Self Care | Attending: Family Medicine

## 2022-10-25 ENCOUNTER — Encounter (HOSPITAL_COMMUNITY): Payer: Self-pay | Admitting: Obstetrics and Gynecology

## 2022-10-25 ENCOUNTER — Inpatient Hospital Stay (HOSPITAL_COMMUNITY): Payer: Commercial Managed Care - HMO | Admitting: Anesthesiology

## 2022-10-25 DIAGNOSIS — N179 Acute kidney failure, unspecified: Secondary | ICD-10-CM

## 2022-10-25 DIAGNOSIS — M726 Necrotizing fasciitis: Secondary | ICD-10-CM | POA: Diagnosis not present

## 2022-10-25 DIAGNOSIS — A419 Sepsis, unspecified organism: Secondary | ICD-10-CM | POA: Diagnosis not present

## 2022-10-25 DIAGNOSIS — B950 Streptococcus, group A, as the cause of diseases classified elsewhere: Secondary | ICD-10-CM | POA: Diagnosis not present

## 2022-10-25 DIAGNOSIS — L0390 Cellulitis, unspecified: Secondary | ICD-10-CM | POA: Diagnosis not present

## 2022-10-25 HISTORY — PX: I & D EXTREMITY: SHX5045

## 2022-10-25 LAB — CBC
HCT: 36.4 % — ABNORMAL LOW (ref 39.0–52.0)
HCT: 34.9 % — ABNORMAL LOW (ref 39.0–52.0)
Hemoglobin: 12.7 g/dL — ABNORMAL LOW (ref 13.0–17.0)
Hemoglobin: 12.1 g/dL — ABNORMAL LOW (ref 13.0–17.0)
MCH: 30.2 pg (ref 26.0–34.0)
MCH: 30.4 pg (ref 26.0–34.0)
MCHC: 34.9 g/dL (ref 30.0–36.0)
MCHC: 34.7 g/dL (ref 30.0–36.0)
MCV: 86.7 fL (ref 80.0–100.0)
MCV: 87.7 fL (ref 80.0–100.0)
Platelets: 388 10*3/uL (ref 150–400)
Platelets: 447 10*3/uL — ABNORMAL HIGH (ref 150–400)
RBC: 4.2 MIL/uL — ABNORMAL LOW (ref 4.22–5.81)
RBC: 3.98 MIL/uL — ABNORMAL LOW (ref 4.22–5.81)
RDW: 13.2 % (ref 11.5–15.5)
RDW: 13.4 % (ref 11.5–15.5)
WBC: 18.7 10*3/uL — ABNORMAL HIGH (ref 4.0–10.5)
WBC: 29.4 10*3/uL — ABNORMAL HIGH (ref 4.0–10.5)
nRBC: 0 % (ref 0.0–0.2)
nRBC: 0 % (ref 0.0–0.2)

## 2022-10-25 LAB — TYPE AND SCREEN
ABO/RH(D): A POS
Antibody Screen: NEGATIVE

## 2022-10-25 LAB — CULTURE, BLOOD (ROUTINE X 2)
Culture: NO GROWTH
Culture: NO GROWTH
Special Requests: ADEQUATE
Special Requests: ADEQUATE

## 2022-10-25 LAB — CREATININE, SERUM
Creatinine, Ser: 0.97 mg/dL (ref 0.61–1.24)
GFR, Estimated: >60 mL/min (ref >60)

## 2022-10-25 LAB — ABO/RH: ABO/RH(D): A POS

## 2022-10-25 SURGERY — IRRIGATION AND DEBRIDEMENT EXTREMITY
Anesthesia: General | Site: Leg Lower | Laterality: Right

## 2022-10-25 MED ORDER — JUVEN PO PACK
1.0000 | PACK | Freq: Two times a day (BID) | ORAL | Status: DC
  Administered 2022-10-26 – 2022-10-30 (×8): 1 via ORAL
  Filled 2022-10-25 (×9): qty 1

## 2022-10-25 MED ORDER — BISACODYL 5 MG PO TBEC: 5.0000 mg | DELAYED_RELEASE_TABLET | Freq: Every day | ORAL | Status: DC | PRN

## 2022-10-25 MED ORDER — ONDANSETRON HCL 4 MG/2ML IJ SOLN: 4.0000 mg | Freq: Four times a day (QID) | INTRAMUSCULAR | Status: DC | PRN

## 2022-10-25 MED ORDER — FENTANYL CITRATE (PF) 100 MCG/2ML IJ SOLN
INTRAMUSCULAR | Status: AC
  Filled 2022-10-25: qty 2

## 2022-10-25 MED ORDER — ALUM & MAG HYDROXIDE-SIMETH 200-200-20 MG/5ML PO SUSP: 15.0000 mL | ORAL | Status: DC | PRN

## 2022-10-25 MED ORDER — OXYCODONE HCL 5 MG PO TABS: 5.0000 mg | ORAL_TABLET | Freq: Once | ORAL | Status: DC | PRN

## 2022-10-25 MED ORDER — POLYETHYLENE GLYCOL 3350 17 G PO PACK: 17.0000 g | PACK | Freq: Every day | ORAL | Status: DC | PRN

## 2022-10-25 MED ORDER — MIDAZOLAM HCL 2 MG/2ML IJ SOLN
INTRAMUSCULAR | Status: DC | PRN
  Administered 2022-10-25: 2 mg via INTRAVENOUS

## 2022-10-25 MED ORDER — VITAMIN C 500 MG PO TABS
1000.0000 mg | ORAL_TABLET | Freq: Every day | ORAL | Status: DC
  Administered 2022-10-25 – 2022-10-30 (×6): 1000 mg via ORAL
  Filled 2022-10-25 (×6): qty 2

## 2022-10-25 MED ORDER — PHENOL 1.4 % MT LIQD: 1.0000 | OROMUCOSAL | Status: DC | PRN

## 2022-10-25 MED ORDER — MAGNESIUM SULFATE 2 GM/50ML IV SOLN: 2.0000 g | Freq: Every day | INTRAVENOUS | Status: DC | PRN

## 2022-10-25 MED ORDER — DEXAMETHASONE SODIUM PHOSPHATE 10 MG/ML IJ SOLN
INTRAMUSCULAR | Status: DC | PRN
  Administered 2022-10-25: 4 mg via INTRAVENOUS

## 2022-10-25 MED ORDER — METOPROLOL TARTRATE 5 MG/5ML IV SOLN: 2.0000 mg | INTRAVENOUS | Status: DC | PRN

## 2022-10-25 MED ORDER — 0.9 % SODIUM CHLORIDE (POUR BTL) OPTIME
TOPICAL | Status: DC | PRN
  Administered 2022-10-25: 1000 mL

## 2022-10-25 MED ORDER — LACTATED RINGERS IV SOLN: INTRAVENOUS | Status: DC

## 2022-10-25 MED ORDER — OXYCODONE HCL 5 MG/5ML PO SOLN: 5.0000 mg | Freq: Once | ORAL | Status: DC | PRN

## 2022-10-25 MED ORDER — ACETAMINOPHEN 10 MG/ML IV SOLN
1000.0000 mg | Freq: Once | INTRAVENOUS | Status: DC | PRN
  Administered 2022-10-25: 1000 mg via INTRAVENOUS

## 2022-10-25 MED ORDER — MUPIROCIN 2 % EX OINT
1.0000 | TOPICAL_OINTMENT | Freq: Two times a day (BID) | CUTANEOUS | Status: AC
  Administered 2022-10-25 – 2022-10-29 (×10): 1 via NASAL
  Filled 2022-10-25 (×5): qty 22

## 2022-10-25 MED ORDER — MIDAZOLAM HCL 2 MG/2ML IJ SOLN
INTRAMUSCULAR | Status: AC
  Filled 2022-10-25: qty 2

## 2022-10-25 MED ORDER — HYDRALAZINE HCL 20 MG/ML IJ SOLN: 5.0000 mg | INTRAMUSCULAR | Status: DC | PRN

## 2022-10-25 MED ORDER — DEXMEDETOMIDINE HCL IN NACL 80 MCG/20ML IV SOLN
INTRAVENOUS | Status: DC | PRN
Start: 2022-10-25 — End: 2022-10-25
  Administered 2022-10-25 (×3): 4 ug via INTRAVENOUS

## 2022-10-25 MED ORDER — SUGAMMADEX SODIUM 200 MG/2ML IV SOLN
INTRAVENOUS | Status: DC | PRN
  Administered 2022-10-25: 200 mg via INTRAVENOUS

## 2022-10-25 MED ORDER — CHLORHEXIDINE GLUCONATE 0.12 % MT SOLN
15.0000 mL | Freq: Once | OROMUCOSAL | Status: AC
  Administered 2022-10-25: 15 mL via OROMUCOSAL
  Filled 2022-10-25: qty 15

## 2022-10-25 MED ORDER — ORAL CARE MOUTH RINSE: 15.0000 mL | Freq: Once | OROMUCOSAL | Status: AC

## 2022-10-25 MED ORDER — PROPOFOL 10 MG/ML IV BOLUS
INTRAVENOUS | Status: DC | PRN
  Administered 2022-10-25: 150 mg via INTRAVENOUS

## 2022-10-25 MED ORDER — FENTANYL CITRATE (PF) 250 MCG/5ML IJ SOLN
INTRAMUSCULAR | Status: AC
  Filled 2022-10-25: qty 5

## 2022-10-25 MED ORDER — VANCOMYCIN HCL 1000 MG IV SOLR
INTRAVENOUS | Status: AC
  Filled 2022-10-25: qty 20

## 2022-10-25 MED ORDER — PANTOPRAZOLE SODIUM 40 MG PO TBEC
40.0000 mg | DELAYED_RELEASE_TABLET | Freq: Every day | ORAL | Status: DC
  Administered 2022-10-26 – 2022-10-30 (×5): 40 mg via ORAL
  Filled 2022-10-25 (×5): qty 1

## 2022-10-25 MED ORDER — VASHE WOUND IRRIGATION OPTIME
TOPICAL | Status: DC | PRN
Start: 2022-10-25 — End: 2022-10-25
  Administered 2022-10-25: 34 [oz_av]

## 2022-10-25 MED ORDER — LABETALOL HCL 5 MG/ML IV SOLN: 10.0000 mg | INTRAVENOUS | Status: DC | PRN

## 2022-10-25 MED ORDER — ONDANSETRON HCL 4 MG/2ML IJ SOLN
INTRAMUSCULAR | Status: DC | PRN
  Administered 2022-10-25: 4 mg via INTRAVENOUS

## 2022-10-25 MED ORDER — SODIUM CHLORIDE 0.9 % IV SOLN: INTRAVENOUS | Status: DC

## 2022-10-25 MED ORDER — FENTANYL CITRATE (PF) 250 MCG/5ML IJ SOLN
INTRAMUSCULAR | Status: DC | PRN
  Administered 2022-10-25 (×2): 50 ug via INTRAVENOUS
  Administered 2022-10-25: 100 ug via INTRAVENOUS
  Administered 2022-10-25: 50 ug via INTRAVENOUS

## 2022-10-25 MED ORDER — VANCOMYCIN HCL 1000 MG IV SOLR
INTRAVENOUS | Status: DC | PRN
Start: 2022-10-25 — End: 2022-10-25
  Administered 2022-10-25: 1000 mg via TOPICAL

## 2022-10-25 MED ORDER — ROCURONIUM BROMIDE 10 MG/ML (PF) SYRINGE
PREFILLED_SYRINGE | INTRAVENOUS | Status: DC | PRN
  Administered 2022-10-25: 70 mg via INTRAVENOUS

## 2022-10-25 MED ORDER — PROPOFOL 10 MG/ML IV BOLUS
INTRAVENOUS | Status: AC
  Filled 2022-10-25: qty 20

## 2022-10-25 MED ORDER — POTASSIUM CHLORIDE CRYS ER 20 MEQ PO TBCR: 20.0000 meq | EXTENDED_RELEASE_TABLET | Freq: Every day | ORAL | Status: DC | PRN

## 2022-10-25 MED ORDER — GUAIFENESIN-DM 100-10 MG/5ML PO SYRP: 15.0000 mL | ORAL_SOLUTION | ORAL | Status: DC | PRN

## 2022-10-25 MED ORDER — MAGNESIUM CITRATE PO SOLN: 1.0000 | Freq: Once | ORAL | Status: DC | PRN

## 2022-10-25 MED ORDER — ZINC SULFATE 220 (50 ZN) MG PO CAPS
220.0000 mg | ORAL_CAPSULE | Freq: Every day | ORAL | Status: DC
  Administered 2022-10-25 – 2022-10-30 (×6): 220 mg via ORAL
  Filled 2022-10-25 (×6): qty 1

## 2022-10-25 MED ORDER — DOCUSATE SODIUM 100 MG PO CAPS
100.0000 mg | ORAL_CAPSULE | Freq: Every day | ORAL | Status: DC
  Administered 2022-10-26: 100 mg via ORAL
  Filled 2022-10-25: qty 1

## 2022-10-25 MED ORDER — ACETAMINOPHEN 10 MG/ML IV SOLN
INTRAVENOUS | Status: AC
  Filled 2022-10-25: qty 100

## 2022-10-25 MED ORDER — ONDANSETRON HCL 4 MG/2ML IJ SOLN: 4.0000 mg | Freq: Once | INTRAMUSCULAR | Status: DC | PRN

## 2022-10-25 MED ORDER — FENTANYL CITRATE (PF) 100 MCG/2ML IJ SOLN
25.0000 ug | INTRAMUSCULAR | Status: DC | PRN
  Administered 2022-10-25 (×3): 50 ug via INTRAVENOUS

## 2022-10-25 SURGICAL SUPPLY — 42 items
BAG COUNTER SPONGE SURGICOUNT (BAG) IMPLANT
BAG SPNG CNTER NS LX DISP (BAG)
BLADE SURG 21 STRL SS (BLADE) ×1 IMPLANT
BNDG CMPR 5X6 CHSV STRCH STRL (GAUZE/BANDAGES/DRESSINGS)
BNDG COHESIVE 6X5 TAN ST LF (GAUZE/BANDAGES/DRESSINGS) IMPLANT
BNDG GAUZE DERMACEA FLUFF 4 (GAUZE/BANDAGES/DRESSINGS) ×2 IMPLANT
BNDG GZE DERMACEA 4 6PLY (GAUZE/BANDAGES/DRESSINGS) ×2
CANISTER WOUNDNEG PRESSURE 500 (CANNISTER) IMPLANT
CLEANSER WND VASHE INSTL 34OZ (WOUND CARE) IMPLANT
CONNECTOR Y WND VAC (MISCELLANEOUS) IMPLANT
COVER SURGICAL LIGHT HANDLE (MISCELLANEOUS) ×2 IMPLANT
DRAPE DERMATAC (DRAPES) IMPLANT
DRAPE INCISE IOBAN 66X45 STRL (DRAPES) IMPLANT
DRAPE U-SHAPE 47X51 STRL (DRAPES) ×1 IMPLANT
DRESSING VERAFLO CLEANS CC MED (GAUZE/BANDAGES/DRESSINGS) IMPLANT
DRSG ADAPTIC 3X8 NADH LF (GAUZE/BANDAGES/DRESSINGS) ×1 IMPLANT
DRSG VERAFLO CLEANSE CC MED (GAUZE/BANDAGES/DRESSINGS) ×2
DURAPREP 26ML APPLICATOR (WOUND CARE) ×1 IMPLANT
ELECT REM PT RETURN 9FT ADLT (ELECTROSURGICAL)
ELECTRODE REM PT RTRN 9FT ADLT (ELECTROSURGICAL) IMPLANT
GAUZE SPONGE 4X4 12PLY STRL (GAUZE/BANDAGES/DRESSINGS) ×1 IMPLANT
GLOVE BIOGEL PI IND STRL 9 (GLOVE) ×1 IMPLANT
GLOVE SURG ORTHO 9.0 STRL STRW (GLOVE) ×1 IMPLANT
GOWN STRL REUS W/ TWL XL LVL3 (GOWN DISPOSABLE) ×2 IMPLANT
GOWN STRL REUS W/TWL XL LVL3 (GOWN DISPOSABLE) ×2
GRAFT SKIN WND SURGICLOSE M95 (Tissue) IMPLANT
HANDPIECE INTERPULSE COAX TIP (DISPOSABLE)
KIT BASIN OR (CUSTOM PROCEDURE TRAY) ×1 IMPLANT
KIT TURNOVER KIT B (KITS) ×1 IMPLANT
MANIFOLD NEPTUNE II (INSTRUMENTS) ×1 IMPLANT
NS IRRIG 1000ML POUR BTL (IV SOLUTION) ×1 IMPLANT
PACK ORTHO EXTREMITY (CUSTOM PROCEDURE TRAY) ×1 IMPLANT
PAD ARMBOARD 7.5X6 YLW CONV (MISCELLANEOUS) ×2 IMPLANT
PAD NEG PRESSURE SENSATRAC (MISCELLANEOUS) IMPLANT
SET HNDPC FAN SPRY TIP SCT (DISPOSABLE) IMPLANT
STOCKINETTE IMPERVIOUS 9X36 MD (GAUZE/BANDAGES/DRESSINGS) IMPLANT
SUT ETHILON 2 0 PSLX (SUTURE) ×1 IMPLANT
SWAB COLLECTION DEVICE MRSA (MISCELLANEOUS) ×1 IMPLANT
SWAB CULTURE ESWAB REG 1ML (MISCELLANEOUS) IMPLANT
TOWEL GREEN STERILE (TOWEL DISPOSABLE) ×1 IMPLANT
TUBE CONNECTING 12X1/4 (SUCTIONS) ×1 IMPLANT
YANKAUER SUCT BULB TIP NO VENT (SUCTIONS) ×1 IMPLANT

## 2022-10-25 NOTE — Plan of Care (Signed)
  Problem: Education: Goal: Knowledge of General Education information will improve Description: Including pain rating scale, medication(s)/side effects and non-pharmacologic comfort measures Outcome: Progressing   Problem: Health Behavior/Discharge Planning: Goal: Ability to manage health-related needs will improve Outcome: Progressing   Problem: Clinical Measurements: Goal: Ability to maintain clinical measurements within normal limits will improve Outcome: Progressing Goal: Will remain free from infection Outcome: Progressing Goal: Diagnostic test results will improve Outcome: Progressing Goal: Respiratory complications will improve Outcome: Progressing Goal: Cardiovascular complication will be avoided Outcome: Progressing   Problem: Activity: Goal: Risk for activity intolerance will decrease Outcome: Progressing   Problem: Nutrition: Goal: Adequate nutrition will be maintained Outcome: Progressing   Problem: Coping: Goal: Level of anxiety will decrease Outcome: Progressing   Problem: Elimination: Goal: Will not experience complications related to bowel motility Outcome: Progressing Goal: Will not experience complications related to urinary retention Outcome: Progressing   Problem: Pain Managment: Goal: General experience of comfort will improve Outcome: Progressing   Problem: Safety: Goal: Ability to remain free from injury will improve Outcome: Progressing   Problem: Skin Integrity: Goal: Risk for impaired skin integrity will decrease Outcome: Progressing   Problem: Fluid Volume: Goal: Hemodynamic stability will improve Outcome: Progressing   Problem: Clinical Measurements: Goal: Diagnostic test results will improve Outcome: Progressing Goal: Signs and symptoms of infection will decrease Outcome: Progressing   Problem: Respiratory: Goal: Ability to maintain adequate ventilation will improve Outcome: Progressing   Problem: Clinical  Measurements: Goal: Ability to avoid or minimize complications of infection will improve Outcome: Progressing   Problem: Skin Integrity: Goal: Skin integrity will improve Outcome: Progressing   Problem: Clinical Measurements: Goal: Ability to avoid or minimize complications of infection will improve Outcome: Progressing   Problem: Skin Integrity: Goal: Skin integrity will improve Outcome: Progressing

## 2022-10-25 NOTE — Anesthesia Preprocedure Evaluation (Signed)
Anesthesia Evaluation  Patient identified by MRN, date of birth, ID band Patient awake    Reviewed: Allergy & Precautions, NPO status , Patient's Chart, lab work & pertinent test results, reviewed documented beta blocker date and time   History of Anesthesia Complications Negative for: history of anesthetic complications  Airway Mallampati: II  TM Distance: >3 FB     Dental no notable dental hx.    Pulmonary neg sleep apnea, neg COPD, neg recent URI, neg PE   breath sounds clear to auscultation       Cardiovascular (-) hypertension(-) angina (-) CAD and (-) Past MI  Rhythm:Regular Rate:Normal     Neuro/Psych neg Seizures    GI/Hepatic   Endo/Other    Renal/GU ARFRenal disease     Musculoskeletal Necrotizing fasciitis    Abdominal   Peds  Hematology Prior sepsis   Anesthesia Other Findings   Reproductive/Obstetrics                              Anesthesia Physical Anesthesia Plan  ASA: 3  Anesthesia Plan: General   Post-op Pain Management:    Induction: Intravenous  PONV Risk Score and Plan: 2 and Ondansetron  Airway Management Planned: Oral ETT  Additional Equipment:   Intra-op Plan:   Post-operative Plan: Extubation in OR  Informed Consent: I have reviewed the patients History and Physical, chart, labs and discussed the procedure including the risks, benefits and alternatives for the proposed anesthesia with the patient or authorized representative who has indicated his/her understanding and acceptance.     Dental advisory given  Plan Discussed with: CRNA  Anesthesia Plan Comments:          Anesthesia Quick Evaluation

## 2022-10-25 NOTE — Progress Notes (Signed)
  Patient's bedside nurse reported that wound VAC is seeping blood out of the wound VAC.  Nurse requesting for recommendation manage of the wound VAC.  Patient is hemodynamically stable.   I have informed bedside nurse to page on-call orthopedics or wound care nurse for the wound VAC recommendation and management. By the meantime checking stat CBC and type and screen in case if hemoglobin dropping will need to do blood transfusion.  Tereasa Coop, MD [facility] 10/25/2022, 8:27 PM

## 2022-10-25 NOTE — Transfer of Care (Signed)
Immediate Anesthesia Transfer of Care Note  Patient: [place]  Procedure(s) Performed: DEBRIDEMENT RIGHT LEG, THIGH, AND FOOT (Right: Leg Lower)  Patient Location: PACU  Anesthesia Type:General  Level of Consciousness: awake  Airway & Oxygen Therapy: Patient Spontanous Breathing  Post-op Assessment: Report given to RN and Post -op Vital signs reviewed and stable  Post vital signs: Reviewed and stable  Last Vitals:  Vitals Value Taken Time  BP    Temp    Pulse 71 10/25/22 1619  Resp 17 10/25/22 1619  SpO2 98 % 10/25/22 1619  Vitals shown include unfiled device data.  Last Pain:  Vitals:   10/25/22 1421  TempSrc:   PainSc: 0-No pain      Patients Stated Pain Goal: 0 (10/24/22 2210)  Complications: No notable events documented.

## 2022-10-25 NOTE — Progress Notes (Signed)
[facility] Pre-op Review 1.Surgical orders: Consent orders:   Yes Consent signed Yes Antibiotic: Ancef 2 G Penicillin G last dose 10/25/22 1101  2.  Pre-procedure checklist completed:  Yes by Floor RN  3.  NPO: Since Midnight  4.  CHG Bath completed: Yes Belongings removed and placed in clean gown:  5.  Labs Performed: CBC    Abnormal 10/25/22 CMP/BMP   Abnormal 10/24/22 PT/INR  Abnormal 10/19/22 HA1C   Normal 10/20/22  Type and Screen NA Surgical PCR:  To be collected. Pregnancy:  NA EKG:    10/20/22 Chest x-ray:  10/18/22   6.  Recent H&P or progress note if inpatient: 10/25/22  7.  Language Barrier:  None  8.  Vital Signs:  Stable Oxygen:  Room Air Tele:  Can the patient travel without Tele  9.  Medications: Pain Medications: Beta Blocker:  Anticoagulants:  Lovenox last dose 10/24/22 2211 GLP1:  Pre-op Medications Ordered:  10. IV access:  11. Diabetic:     NA

## 2022-10-25 NOTE — Interval H&P Note (Signed)
History and Physical Interval Note:  10/25/2022 6:41 AM  [place]  has presented today for surgery, with the diagnosis of Necrotizing Fasciitis Right Leg.  The various methods of treatment have been discussed with the patient and family. After consideration of risks, benefits and other options for treatment, the patient has consented to  Procedure(s): DEBRIDEMENT RIGHT LEG, THIGH, AND FOOT (Right) as a surgical intervention.  The patient's history has been reviewed, patient examined, no change in status, stable for surgery.  I have reviewed the patient's chart and labs.  Questions were answered to the patient's satisfaction.     [name]

## 2022-10-25 NOTE — Op Note (Signed)
10/25/2022  4:15 PM  PATIENT:  [place]    PRE-OPERATIVE DIAGNOSIS:  Necrotizing Fasciitis Right Leg  POST-OPERATIVE DIAGNOSIS:  Same  PROCEDURE:  excisional DEBRIDEMENT RIGHT calf Excisional debridement right THIGH Application Kerecis micro graft 95 cm. Application of cleanse choice sponges x 3.  SURGEON:  [name], MD  PHYSICIAN ASSISTANT:None ANESTHESIA:   General  PREOPERATIVE INDICATIONS:  De[place] is a  27 y.o. male with a diagnosis of Necrotizing Fasciitis Right Leg who failed conservative measures and elected for surgical management.    The risks benefits and alternatives were discussed with the patient preoperatively including but not limited to the risks of infection, bleeding, nerve injury, cardiopulmonary complications, the need for revision surgery, among others, and the patient was willing to proceed.  OPERATIVE IMPLANTS:   Implant Name Type Inv. Item Serial No. Manufacturer Lot No. LRB No. Used Action  GRAFT SKIN WND SURGICLOSE M95 - [id number] Tissue GRAFT SKIN WND SURGICLOSE M95  [employer] [id number] Right 1 Implanted    @ENCIMAGES @  OPERATIVE FINDINGS: Muscle had good color and contractility.  Further fascia was excised and the calf and thigh.  OPERATIVE PROCEDURE: Patient was brought the operating room underwent a general anesthetic.  After adequate levels anesthesia obtained patient was placed prone the operating room table all bony prominences were padded and the right lower extremity was prepped using DuraPrep draped into a sterile field a timeout was called.  The medial calf incision was extended to 25 cm.  This was carried down to fascia nonviable fascia was excised the muscle had good color and contractility.  Surgical excision was performed with a 21 blade knife and a rondure.  This was irrigated with Vashe irrigation.  Attention was then focused laterally the lateral incision was extended to 15 cm.  Further excisional debridement with a  rondure and a 21 blade knife of skin soft tissue muscle and fasc  This looks much better as well this was irrigated with Vashe.  1 g of vancomycin powder was then mixed with 95 cm of Kerecis micro graft and this was applied to the wound beds on the thigh and calf.  This was then covered with the cleanse choice wound VAC sponge.  2-0 nylon was then used to approximate the skin over the wound VAC sponge this was connected to an incisional wound VAC.  The 2 wound vacs were tied together this had a good suction fit patient was extubated taken the PACU in stable condition.   DISCHARGE PLANNING:  Plan for return to the operating room on Friday for further debridement anticipate being able to close the wounds apply an incisional wound VAC and patient could discharge on antibiotics.

## 2022-10-25 NOTE — Anesthesia Procedure Notes (Signed)
Procedure Name: Intubation Date/Time: 10/25/2022 3:26 PM  Performed by: Sandie Ano, CRNAPre-anesthesia Checklist: Patient identified, Emergency Drugs available, Suction available and Patient being monitored Patient Re-evaluated:Patient Re-evaluated prior to induction Oxygen Delivery Method: Circle System Utilized Preoxygenation: Pre-oxygenation with 100% oxygen Induction Type: IV induction Ventilation: Mask ventilation without difficulty Laryngoscope Size: Mac and 3 Grade View: Grade I Tube type: Oral Tube size: 7.0 mm Number of attempts: 1 Airway Equipment and Method: Stylet and Oral airway Placement Confirmation: ETT inserted through vocal cords under direct vision, positive ETCO2 and breath sounds checked- equal and bilateral Secured at: 22 cm Tube secured with: Tape Dental Injury: Teeth and Oropharynx as per pre-operative assessment

## 2022-10-25 NOTE — Progress Notes (Signed)
PROGRESS NOTE    Dean Nichols  WUJ:[id number] DOB: 07-27-1995 DOA: 10/18/2022 PCP: Pcp, No   Brief Narrative:  This 27 yrs old male with medical history significant of previous history of ACL tear of the right knee joint, otherwise no significant medical history who was working apparently in Aeronautical engineer.  He was working in the yard when he accidentally cut his leg around the lateral aspect of the right knee with a metal pipe.  He presented to the ED from urgent care with fevers, chills, nausea, vomiting -> sepsis -> was admitted for sepsis due to right lower extremity cellulitis. He is found to have necrotizing fasciitis.  ID and ortho following.  Plan for OR 10/9 and 10/11 (s/p R leg debridement on 10/6).   Assessment & Plan:   Principal Problem:   Sepsis due to cellulitis [facility]) Active Problems:   AKI (acute kidney injury) (HCC)   Hyponatremia   Necrotizing fasciitis due to Streptococcus pyogenes (HCC)   Sepsis due to group A Streptococcus with acute organ dysfunction and septic shock (HCC)  Sepsis  / Streptococcus pyogenes bacteremia: Necrotizing fasciitis of right lower extremity Met criteria for sepsis on presentation, continues to meet criteria for sepsis with tachycardia, fever, leukocytosis. He spiked fever on 10/5, leukocytosis is persistent. Blood cultures showed Strep pyogenes in 1/2 bottles. Repeat cultures from 10/4 NGx2 Appreciate ortho recs - s/p R leg excisional debridement, necrotizing fasciitis of thigh and calf  Ortho planning to return to OR on 10/9 and 10/11 Surgical culture 10/6 Neg x 2 days  Penicillin and linezolid (per ID, likely to d/c zyvox after surgery ) ID c/s -> appreciate recs. Repeat cultures 10/4 AM - NG Echocardiogram EF 55-60%, no RWMA S/p Tdap 10/2 in ED.   Left Upper Lip Ulceration: Possibly related to surgery. Continue to monitor.   Elevated LFT's: ? Shock liver RUQ [place] without concerning findings of liver and gallbladder. Acute hepatitis  panel  negative Fluctuating, Continue to monitor   Acute kidney Injury: > Resolved. Baseline creatinine unknown Suspect due to sepsis Follow renal US (normal), UA unrevealing   Hypokalemia: Replaced and resolved.    DVT prophylaxis: Lovenox Code Status: Full code Family Communication: Spoke with mother on the phone. Disposition Plan: Status is: Inpatient Remains inpatient appropriate because: Admitted for necrotizing fasciitis is scheduled for OR today.    Consultants:  Infectious diseases Orthopedics  Procedures: Antimicrobials: Anti-infectives (From admission, onward)    Start     Dose/Rate Route Frequency Ordered Stop   10/25/22 0600  ceFAZolin (ANCEF) IVPB 2g/100 mL premix        2 g 200 mL/hr over 30 Minutes Intravenous On call to O.R. 10/24/22 1617 10/26/22 0559   10/20/22 2200  linezolid (ZYVOX) tablet 600 mg        600 mg Oral Every 12 hours 10/20/22 1018     10/19/22 1300  penicillin G potassium 12 Million Units in dextrose 5 % 500 mL CONTINUOUS infusion        12 Million Units 41.7 mL/hr over 12 Hours Intravenous Every 12 hours 10/19/22 1213     10/19/22 1300  linezolid (ZYVOX) IVPB 600 mg  Status:  Discontinued        600 mg 300 mL/hr over 60 Minutes Intravenous Every 12 hours 10/19/22 1213 10/20/22 1018   10/19/22 0600  vancomycin (VANCOCIN) IVPB 1000 mg/200 mL premix  Status:  Discontinued        1,000 mg 200 mL/hr over 60 Minutes Intravenous Every  12 hours 10/18/22 1734 10/19/22 1213   10/19/22 0000  ceFEPIme (MAXIPIME) 2 g in sodium chloride 0.9 % 100 mL IVPB  Status:  Discontinued        2 g 200 mL/hr over 30 Minutes Intravenous Every 8 hours 10/18/22 1734 10/19/22 1213   10/18/22 2130  vancomycin (VANCOCIN) IVPB 1000 mg/200 mL premix  Status:  Discontinued        1,000 mg 200 mL/hr over 60 Minutes Intravenous  Once 10/18/22 2033 10/18/22 2047   10/18/22 1630  ceFEPIme (MAXIPIME) 2 g in sodium chloride 0.9 % 100 mL IVPB        2 g 200 mL/hr over 30  Minutes Intravenous  Once 10/18/22 1616 10/18/22 1820   10/18/22 1630  metroNIDAZOLE (FLAGYL) IVPB 500 mg        500 mg 100 mL/hr over 60 Minutes Intravenous  Once 10/18/22 1616 10/18/22 1819   10/18/22 1630  vancomycin (VANCOCIN) IVPB 1000 mg/200 mL premix        1,000 mg 200 mL/hr over 60 Minutes Intravenous  Once 10/18/22 1616 10/18/22 1924      Subjective: Patient was seen and examined at bedside.Overnight events noted.   Patient reports doing better. He still reports having pain in the right thigh and the leg. Right leg still appears swollen,  covered in dressing.  Objective: Vitals:   10/24/22 1526 10/24/22 2031 10/25/22 0611 10/25/22 0809  BP: 115/65 114/70 118/67 109/63  Pulse: 71 64 64 (!) 58  Resp: 18 17 18 18   Temp: 98 F (36.7 C) 98.3 F (36.8 C) 98.6 F (37 C) 98.2 F (36.8 C)  TempSrc: Oral Axillary Oral Oral  SpO2: 98% 99% 98% 98%  Weight:      Height:        Intake/Output Summary (Last 24 hours) at 10/25/2022 1405 Last data filed at 10/25/2022 0800 Gross per 24 hour  Intake --  Output 2570 ml  Net -2570 ml   Filed Weights   10/18/22 1518 10/19/22 0350  Weight: 74.8 kg 75.1 kg    Examination:  General exam: Appears calm and comfortable , not in any acute distress. Respiratory system: Clear to auscultation. Respiratory effort normal.  RR 16 Cardiovascular system: S1 & S2 heard, RRR. No JVD, murmurs, rubs, gallops or clicks.  Gastrointestinal system: Abdomen is nondistended, soft and nontender. Normal bowel sounds heard. Central nervous system: Alert and oriented x 3. No focal neurological deficits. Extremities: RLE swollen, tender, covered in dressing Skin: No rashes, lesions or ulcers Psychiatry: Judgement and insight appear normal. Mood & affect appropriate.     Data Reviewed: I have personally reviewed following labs and imaging studies  CBC: Recent Labs  Lab 10/18/22 1527 10/18/22 2050 10/20/22 0552 10/21/22 0551 10/22/22 0639  10/23/22 1008 10/24/22 0522 10/25/22 0531  WBC 17.9*   < > 19.9* 21.6* 17.2* 18.9* 13.6* 18.7*  NEUTROABS 16.3*  --  18.5* 17.6* 12.2* 14.7*  --   --   HGB 16.2   < > 12.0* 11.6* 11.8* 12.8* 11.4* 12.7*  HCT 44.9   < > 32.7* 31.8* 32.4* 35.9* 32.2* 36.4*  MCV 86.3   < > 87.4 84.8 84.6 85.7 87.0 86.7  PLT 227   < > 172 206 231 298 296 388   < > = values in this interval not displayed.   Basic Metabolic Panel: Recent Labs  Lab 10/20/22 0552 10/21/22 0551 10/22/22 0639 10/23/22 1008 10/24/22 0522 10/25/22 0531  NA 130* 132* 135 136  138  --   K 3.5 3.3* 3.4* 4.3 4.0  --   CL 98 100 101 100 102  --   CO2 24 24 25 26 27   --   GLUCOSE 112* 100* 100* 120* 94  --   BUN 14 12 10 12 15   --   CREATININE 1.14 1.23 1.11 1.02 1.01 0.97  CALCIUM 7.8* 7.6* 7.6* 8.1* 8.0*  --   MG 1.8 1.8 1.9 2.3 2.0  --   PHOS 1.4* 2.7 3.4 3.2 3.6  --    GFR: Estimated Creatinine Clearance: 120.4 mL/min (by C-G formula based on SCr of 0.97 mg/dL). Liver Function Tests: Recent Labs  Lab 10/20/22 0552 10/21/22 0551 10/22/22 0639 10/23/22 1008 10/24/22 0522  AST 406* 157* 135* 67* 84*  ALT 271* 184* 182* 162* 152*  ALKPHOS 75 105 183* 191* 165*  BILITOT 1.1 1.2 0.9 0.5 0.4  PROT 5.1* 5.1* 5.3* 5.8* 5.3*  ALBUMIN 2.2* 2.0* 2.0* 2.1* 2.1*   No results for input(s): "LIPASE", "AMYLASE" in the last 168 hours. No results for input(s): "AMMONIA" in the last 168 hours. Coagulation Profile: Recent Labs  Lab 10/18/22 1527 10/19/22 0633  INR 1.4* 1.5*   Cardiac Enzymes: No results for input(s): "CKTOTAL", "CKMB", "CKMBINDEX", "TROPONINI" in the last 168 hours. BNP (last 3 results) No results for input(s): "PROBNP" in the last 8760 hours. HbA1C: No results for input(s): "HGBA1C" in the last 72 hours. CBG: No results for input(s): "GLUCAP" in the last 168 hours. Lipid Profile: No results for input(s): "CHOL", "HDL", "LDLCALC", "TRIG", "CHOLHDL", "LDLDIRECT" in the last 72 hours. Thyroid Function  Tests: No results for input(s): "TSH", "T4TOTAL", "FREET4", "T3FREE", "THYROIDAB" in the last 72 hours. Anemia Panel: No results for input(s): "VITAMINB12", "FOLATE", "FERRITIN", "TIBC", "IRON", "RETICCTPCT" in the last 72 hours. Sepsis Labs: Recent Labs  Lab 10/18/22 1527 10/18/22 1739 10/19/22 0633 10/20/22 1453  PROCALCITON  --   --  45.04  --   LATICACIDVEN 3.1* 2.0*  --  1.2    Recent Results (from the past 240 hour(s))  Culture, blood (Routine x 2)     Status: Abnormal   Collection Time: 10/18/22  3:26 PM   Specimen: BLOOD LEFT ARM  Result Value Ref Range Status   Specimen Description   Final    BLOOD LEFT ARM Performed at [facility], [address], [place], [place] [address]    Special Requests   Final    BOTTLES DRAWN AEROBIC AND ANAEROBIC Blood Culture adequate volume Performed at [facility], [address], [place], [place] [address]    Culture  Setup Time   Final    GRAM POSITIVE COCCI IN CHAINS ANAEROBIC BOTTLE ONLY CRITICAL RESULT CALLED TO, READ BACK BY AND VERIFIED WITH: PHARMD BAILEY A 1153 FCP    Culture (A)  Final    STREPTOCOCCUS PYOGENES HEALTH DEPARTMENT NOTIFIED Performed at [facility], [address] [place], [place] [address]    Report Status 10/21/2022 FINAL  Final   Organism ID, Bacteria STREPTOCOCCUS PYOGENES  Final      Susceptibility   Streptococcus pyogenes - MIC*    PENICILLIN <=0.06 SENSITIVE Sensitive     CEFTRIAXONE <=0.12 SENSITIVE Sensitive     ERYTHROMYCIN <=0.12 SENSITIVE Sensitive     LEVOFLOXACIN 0.5 SENSITIVE Sensitive     VANCOMYCIN 0.25 SENSITIVE Sensitive     * STREPTOCOCCUS PYOGENES  Blood Culture ID Panel (Reflexed)     Status: Abnormal   Collection Time:  10/18/22  3:26 PM  Result Value Ref Range Status   Enterococcus faecalis NOT DETECTED NOT DETECTED Final   Enterococcus Faecium NOT DETECTED NOT DETECTED Final   Listeria monocytogenes NOT DETECTED NOT DETECTED Final    Staphylococcus species NOT DETECTED NOT DETECTED Final   Staphylococcus aureus (BCID) NOT DETECTED NOT DETECTED Final   Staphylococcus epidermidis NOT DETECTED NOT DETECTED Final   Staphylococcus lugdunensis NOT DETECTED NOT DETECTED Final   Streptococcus species DETECTED (A) NOT DETECTED Final    Comment: CRITICAL RESULT CALLED TO, READ BACK BY AND VERIFIED WITH: PHARMD BAILEY A 1153 FCP    Streptococcus agalactiae NOT DETECTED NOT DETECTED Final   Streptococcus pneumoniae NOT DETECTED NOT DETECTED Final   Streptococcus pyogenes DETECTED (A) NOT DETECTED Final    Comment: CRITICAL RESULT CALLED TO, READ BACK BY AND VERIFIED WITH: PHARMD BAILEY A 1153 FCP    A.calcoaceticus-baumannii NOT DETECTED NOT DETECTED Final   Bacteroides fragilis NOT DETECTED NOT DETECTED Final   Enterobacterales NOT DETECTED NOT DETECTED Final   Enterobacter cloacae complex NOT DETECTED NOT DETECTED Final   Escherichia coli NOT DETECTED NOT DETECTED Final   Klebsiella aerogenes NOT DETECTED NOT DETECTED Final   Klebsiella oxytoca NOT DETECTED NOT DETECTED Final   Klebsiella pneumoniae NOT DETECTED NOT DETECTED Final   Proteus species NOT DETECTED NOT DETECTED Final   Salmonella species NOT DETECTED NOT DETECTED Final   Serratia marcescens NOT DETECTED NOT DETECTED Final   Haemophilus influenzae NOT DETECTED NOT DETECTED Final   Neisseria meningitidis NOT DETECTED NOT DETECTED Final   Pseudomonas aeruginosa NOT DETECTED NOT DETECTED Final   Stenotrophomonas maltophilia NOT DETECTED NOT DETECTED Final   Candida albicans NOT DETECTED NOT DETECTED Final   Candida auris NOT DETECTED NOT DETECTED Final   Candida glabrata NOT DETECTED NOT DETECTED Final   Candida krusei NOT DETECTED NOT DETECTED Final   Candida parapsilosis NOT DETECTED NOT DETECTED Final   Candida tropicalis NOT DETECTED NOT DETECTED Final   Cryptococcus neoformans/gattii NOT DETECTED NOT DETECTED Final    Comment: Performed at [facility], [address], [place], [place] [address]  Culture, blood (Routine x 2)     Status: None   Collection Time: 10/18/22  3:31 PM   Specimen: BLOOD  Result Value Ref Range Status   Specimen Description   Final    BLOOD LEFT ANTECUBITAL Performed at [facility], [address], [place], [place] [address]    Special Requests   Final    BOTTLES DRAWN AEROBIC AND ANAEROBIC Blood Culture adequate volume Performed at [facility], [address] [place], [place] [address]    Culture   Final    NO GROWTH 5 DAYS Performed at [facility], [address], [place], [place] [address]    Report Status 10/23/2022 FINAL  Final  Culture, blood (Routine X 2) w Reflex to ID Panel     Status: None   Collection Time: 10/20/22  5:52 AM   Specimen: BLOOD  Result Value Ref Range Status   Specimen Description BLOOD BLOOD RIGHT ARM  Final   Special Requests   Final    BOTTLES DRAWN AEROBIC AND ANAEROBIC Blood Culture adequate volume   Culture   Final    NO GROWTH 5 DAYS Performed at [facility], [address], [place], [place] [address]    Report Status 10/25/2022 FINAL  Final  Culture, blood (Routine X 2) w Reflex to ID Panel  Status: None   Collection Time: 10/20/22  5:57 AM   Specimen: BLOOD  Result Value Ref Range Status   Specimen Description BLOOD BLOOD RIGHT ARM  Final   Special Requests   Final    BOTTLES DRAWN AEROBIC AND ANAEROBIC Blood Culture adequate volume   Culture   Final    NO GROWTH 5 DAYS Performed at [facility], [address] [place], [place] [address]    Report Status 10/25/2022 FINAL  Final  Aerobic/Anaerobic Culture w Gram Stain (surgical/deep wound)     Status: None (Preliminary result)   Collection Time: 10/22/22  2:51 PM   Specimen: Soft Tissue, Other  Result Value Ref Range Status   Specimen Description TISSUE  Final   Special Requests right gastroc fascia  Final   Gram Stain NO WBC SEEN NO  ORGANISMS SEEN   Final   Culture   Final    NO GROWTH 3 DAYS NO ANAEROBES ISOLATED; CULTURE IN PROGRESS FOR 5 DAYS Performed at [facility], [address], [place], [place] [address]    Report Status PENDING  Incomplete    Radiology Studies: No results found.  Scheduled Meds:  acetaminophen  1,000 mg Oral Q8H   chlorhexidine  60 mL Topical Once   docusate sodium  100 mg Oral BID   enoxaparin (LOVENOX) injection  40 mg Subcutaneous Q24H   linezolid  600 mg Oral Q12H   mupirocin ointment  1 Application Nasal BID   pantoprazole  40 mg Oral Daily   polyethylene glycol  17 g Oral BID   Continuous Infusions:   ceFAZolin (ANCEF) IV     methocarbamol (ROBAXIN) IV     penicillin G potassium 12 Million Units in dextrose 5 % 500 mL CONTINUOUS infusion 12 Million Units (10/25/22 1101)     LOS: 7 days    Time spent: 50 mins    Willeen Niece, MD [facility]   If 7PM-7AM, please contact night-coverage

## 2022-10-25 NOTE — Plan of Care (Signed)
  Problem: Education: Goal: Knowledge of General Education information will improve Description: Including pain rating scale, medication(s)/side effects and non-pharmacologic comfort measures Outcome: Progressing   Problem: Health Behavior/Discharge Planning: Goal: Ability to manage health-related needs will improve Outcome: Progressing   Problem: Clinical Measurements: Goal: Ability to maintain clinical measurements within normal limits will improve Outcome: Progressing Goal: Will remain free from infection Outcome: Progressing Goal: Diagnostic test results will improve Outcome: Progressing Goal: Respiratory complications will improve Outcome: Progressing Goal: Cardiovascular complication will be avoided Outcome: Progressing   Problem: Activity: Goal: Risk for activity intolerance will decrease Outcome: Progressing   Problem: Nutrition: Goal: Adequate nutrition will be maintained Outcome: Progressing   Problem: Coping: Goal: Level of anxiety will decrease Outcome: Progressing   Problem: Elimination: Goal: Will not experience complications related to bowel motility Outcome: Progressing Goal: Will not experience complications related to urinary retention Outcome: Progressing   Problem: Pain Managment: Goal: General experience of comfort will improve Outcome: Progressing   Problem: Safety: Goal: Ability to remain free from injury will improve Outcome: Progressing   Problem: Skin Integrity: Goal: Risk for impaired skin integrity will decrease Outcome: Progressing   Problem: Fluid Volume: Goal: Hemodynamic stability will improve Outcome: Progressing   Problem: Clinical Measurements: Goal: Diagnostic test results will improve Outcome: Progressing Goal: Signs and symptoms of infection will decrease Outcome: Progressing   Problem: Respiratory: Goal: Ability to maintain adequate ventilation will improve Outcome: Progressing   Problem: Clinical  Measurements: Goal: Ability to avoid or minimize complications of infection will improve Outcome: Progressing   Problem: Skin Integrity: Goal: Skin integrity will improve Outcome: Progressing   Problem: Clinical Measurements: Goal: Ability to avoid or minimize complications of infection will improve Outcome: Progressing   Problem: Skin Integrity: Goal: Skin integrity will improve Outcome: Progressing   Problem: Education: Goal: Knowledge of the prescribed therapeutic regimen will improve Outcome: Progressing Goal: Ability to verbalize activity precautions or restrictions will improve Outcome: Progressing Goal: Understanding of discharge needs will improve Outcome: Progressing   Problem: Activity: Goal: Ability to perform//tolerate increased activity and mobilize with assistive devices will improve Outcome: Progressing   Problem: Clinical Measurements: Goal: Postoperative complications will be avoided or minimized Outcome: Progressing   Problem: Self-Care: Goal: Ability to meet self-care needs will improve Outcome: Progressing   Problem: Self-Concept: Goal: Ability to maintain and perform role responsibilities to the fullest extent possible will improve Outcome: Progressing   Problem: Pain Management: Goal: Pain level will decrease with appropriate interventions Outcome: Progressing   Problem: Skin Integrity: Goal: Demonstration of wound healing without infection will improve Outcome: Progressing

## 2022-10-25 NOTE — Progress Notes (Signed)
  Patient has active bleeding from wound VAC site.  Deferring pharmacological prophylaxis either with Lovenox or heparin at this time.  Tereasa Coop, MD [facility] 10/25/2022, 11:45 PM

## 2022-10-25 NOTE — Progress Notes (Signed)
Assessed  patient and discovered wound VAC has leaked and saturated pillow under leg. According to patient the saturation happened within 20 minutes of him looking at his leg. Reached out to provider  on call and was advised to contact on call orthopedics provider. On call orthopedics provider advised leg be wrapped with gauze and ace to absorb the drainage. Will continue to monitor

## 2022-10-26 ENCOUNTER — Encounter (HOSPITAL_COMMUNITY): Payer: Self-pay | Admitting: Orthopedic Surgery

## 2022-10-26 DIAGNOSIS — A400 Sepsis due to streptococcus, group A: Secondary | ICD-10-CM | POA: Diagnosis not present

## 2022-10-26 DIAGNOSIS — L0390 Cellulitis, unspecified: Secondary | ICD-10-CM | POA: Diagnosis not present

## 2022-10-26 DIAGNOSIS — A419 Sepsis, unspecified organism: Secondary | ICD-10-CM | POA: Diagnosis not present

## 2022-10-26 DIAGNOSIS — B950 Streptococcus, group A, as the cause of diseases classified elsewhere: Secondary | ICD-10-CM | POA: Diagnosis not present

## 2022-10-26 DIAGNOSIS — M726 Necrotizing fasciitis: Secondary | ICD-10-CM | POA: Diagnosis not present

## 2022-10-26 LAB — COMPREHENSIVE METABOLIC PANEL
ALT: 131 U/L — ABNORMAL HIGH (ref 0–44)
AST: 58 U/L — ABNORMAL HIGH (ref 15–41)
Albumin: 2.7 g/dL — ABNORMAL LOW (ref 3.5–5.0)
Alkaline Phosphatase: 190 U/L — ABNORMAL HIGH (ref 38–126)
Anion gap: 10 (ref 5–15)
BUN: 11 mg/dL (ref 6–20)
CO2: 27 mmol/L (ref 22–32)
Calcium: 8.8 mg/dL — ABNORMAL LOW (ref 8.9–10.3)
Chloride: 98 mmol/L (ref 98–111)
Creatinine, Ser: 1.02 mg/dL (ref 0.61–1.24)
GFR, Estimated: >60 mL/min (ref >60)
Glucose, Bld: 105 mg/dL — ABNORMAL HIGH (ref 70–99)
Potassium: 4.3 mmol/L (ref 3.5–5.1)
Sodium: 135 mmol/L (ref 135–145)
Total Bilirubin: 0.6 mg/dL (ref 0.3–1.2)
Total Protein: 6.7 g/dL (ref 6.5–8.1)

## 2022-10-26 LAB — CBC
HCT: 31.6 % — ABNORMAL LOW (ref 39.0–52.0)
Hemoglobin: 11 g/dL — ABNORMAL LOW (ref 13.0–17.0)
MCH: 30.7 pg (ref 26.0–34.0)
MCHC: 34.8 g/dL (ref 30.0–36.0)
MCV: 88.3 fL (ref 80.0–100.0)
Platelets: 447 10*3/uL — ABNORMAL HIGH (ref 150–400)
RBC: 3.58 MIL/uL — ABNORMAL LOW (ref 4.22–5.81)
RDW: 13.4 % (ref 11.5–15.5)
WBC: 17.6 10*3/uL — ABNORMAL HIGH (ref 4.0–10.5)
nRBC: 0 % (ref 0.0–0.2)

## 2022-10-26 NOTE — Progress Notes (Signed)
    Durable Medical Equipment  (From admission, onward)           Start     Ordered   10/26/22 1618  For home use only DME lightweight manual wheelchair with seat cushion  Once       Comments: Patient suffers from right lower leg necrotizing fasciitis which impairs their ability to perform daily activities like bathing and dressing in the home.  A crutch or walker will not resolve  issue with performing activities of daily living. A wheelchair will allow patient to safely perform daily activities. Patient is not able to propel themselves in the home using a standard weight wheelchair due to general weakness. Patient can self propel in the lightweight wheelchair. Length of need 6 months . Accessories: elevating leg rests (ELRs), wheel locks, extensions and anti-tippers.   10/26/22 1618   10/26/22 1612  For home use only DME Walker rolling  Once       Question Answer Comment  Walker: With 5 Inch Wheels   Patient needs a walker to treat with the following condition Necrotizing fasciitis of lower leg (HCC)      10/26/22 1618

## 2022-10-26 NOTE — TOC Progression Note (Addendum)
Transition of Care [facility]) - Progression Note    Patient Details  Name: Dean Nichols MRN: [id number] Date of Birth: 05/26/1995  Transition of Care [facility]) CM/SW Contact  Janae Bridgeman, RN Phone Number: 10/26/2022, 2:23 PM  Clinical Narrative:    Cm met with the patient at the bedside and patient currently has Right lower leg raised on pillow with Wound vac intact at this time.  Patient is pending surgery tomorrow with Dr. Lajoyce Corners with likely closure of the wound per Dr. Audrie Lia notes.  Patient plans to return home to his mother's home in [place] when medically stable for discharge.  Patient is aware that CM will continue to stay in contact with Maurine Minister, [facility] with [employer] to assist with discharge needs for home - [phone] and likely need for [facility] PT and possible [facility] RN - pending on wound care needs post-surgery.  I faxed clinicals to CM with Marciano Sequin at fax # [phone] to assistance to obtain [facility] PT that are in network with worker's compensation providers.  CM will continue to follow the patient for TOC needs.  HH orders are placed at this time for [facility] RN and PT - pending post-surgery needs.  10/26/22 1623 - CM called and spoke with [facility] and they are able to provide home health services to the patient through an LOG through [facility].  I called the Worker's Comp RNCM with [employer] and left a detailed voicemail so that she can call [facility] and provide the requested LOG for payment for needed home health services.    I also included in the phone message to [employer] that patient would need WC and RW as well.  DME orders were provided to be co-signed by the MD and sent to [employer] to order the DME.  CM will continue to follow the patient for TOC needs.  10/26/22 1700 - I called and spoke with Maurine Minister, RNCM with [employer] and she will drop ship the [facility] and RW to the patient's mother's home in [place].  Orders and DME  note were faxed to [employer] at fax # [phone].    [employer] has made arrangements with [employer] to provide [facility] services for RN, PT and OT and sent LOG contract to [employer].  CM will continue to follow the patient for [facility] needs post op surgery with Dr. Lajoyce Corners tomorrow.  Patient is aware of DME shipment to his mother's home and pending home health services.   Expected Discharge Plan: Home/Self Care Barriers to Discharge: Continued Medical Work up  Expected Discharge Plan and Services   Discharge Planning Services: CM Consult   Living arrangements for the past 2 months: Single Family Home                                       Social Determinants of Health (SDOH) Interventions SDOH Screenings   Food Insecurity: No Food Insecurity (10/18/2022)  Housing: Patient Declined (10/18/2022)  Transportation Needs: No Transportation Needs (10/18/2022)  Utilities: Not At Risk (10/18/2022)  Tobacco Use: Low Risk  (10/25/2022)    Readmission Risk Interventions     No data to display

## 2022-10-26 NOTE — Progress Notes (Signed)
Patient ID: Dean Nichols, male   DOB: July 15, 1995, 27 y.o.   MRN: [id number] Postop day 1 repeat debridement for necrotizing fasciitis.  Hemoglobin stable at 12.1.  White cell count has bumped up secondary to surgery.  The tissue margins were clear and anticipate closure of the wounds with surgery tomorrow.

## 2022-10-26 NOTE — Plan of Care (Signed)
  Problem: Education: Goal: Knowledge of General Education information will improve Description: Including pain rating scale, medication(s)/side effects and non-pharmacologic comfort measures Outcome: Progressing   Problem: Health Behavior/Discharge Planning: Goal: Ability to manage health-related needs will improve Outcome: Progressing   Problem: Clinical Measurements: Goal: Ability to maintain clinical measurements within normal limits will improve Outcome: Progressing Goal: Will remain free from infection Outcome: Progressing Goal: Diagnostic test results will improve Outcome: Progressing Goal: Respiratory complications will improve Outcome: Progressing Goal: Cardiovascular complication will be avoided Outcome: Progressing   Problem: Activity: Goal: Risk for activity intolerance will decrease Outcome: Progressing   Problem: Nutrition: Goal: Adequate nutrition will be maintained Outcome: Progressing   Problem: Coping: Goal: Level of anxiety will decrease Outcome: Progressing   Problem: Elimination: Goal: Will not experience complications related to bowel motility Outcome: Progressing Goal: Will not experience complications related to urinary retention Outcome: Progressing   Problem: Pain Managment: Goal: General experience of comfort will improve Outcome: Progressing   Problem: Safety: Goal: Ability to remain free from injury will improve Outcome: Progressing   Problem: Skin Integrity: Goal: Risk for impaired skin integrity will decrease Outcome: Progressing   Problem: Fluid Volume: Goal: Hemodynamic stability will improve Outcome: Progressing   Problem: Clinical Measurements: Goal: Diagnostic test results will improve Outcome: Progressing Goal: Signs and symptoms of infection will decrease Outcome: Progressing   Problem: Respiratory: Goal: Ability to maintain adequate ventilation will improve Outcome: Progressing   Problem: Clinical  Measurements: Goal: Ability to avoid or minimize complications of infection will improve Outcome: Progressing   Problem: Skin Integrity: Goal: Skin integrity will improve Outcome: Progressing   Problem: Clinical Measurements: Goal: Ability to avoid or minimize complications of infection will improve Outcome: Progressing   Problem: Skin Integrity: Goal: Skin integrity will improve Outcome: Progressing   Problem: Education: Goal: Knowledge of the prescribed therapeutic regimen will improve Outcome: Progressing Goal: Ability to verbalize activity precautions or restrictions will improve Outcome: Progressing Goal: Understanding of discharge needs will improve Outcome: Progressing   Problem: Activity: Goal: Ability to perform//tolerate increased activity and mobilize with assistive devices will improve Outcome: Progressing   Problem: Clinical Measurements: Goal: Postoperative complications will be avoided or minimized Outcome: Progressing   Problem: Self-Care: Goal: Ability to meet self-care needs will improve Outcome: Progressing   Problem: Self-Concept: Goal: Ability to maintain and perform role responsibilities to the fullest extent possible will improve Outcome: Progressing   Problem: Pain Management: Goal: Pain level will decrease with appropriate interventions Outcome: Progressing   Problem: Skin Integrity: Goal: Demonstration of wound healing without infection will improve Outcome: Progressing

## 2022-10-26 NOTE — H&P (View-Only) (Signed)
Patient ID: Dean Nichols, male   DOB: July 15, 1995, 27 y.o.   MRN: [id number] Postop day 1 repeat debridement for necrotizing fasciitis.  Hemoglobin stable at 12.1.  White cell count has bumped up secondary to surgery.  The tissue margins were clear and anticipate closure of the wounds with surgery tomorrow.

## 2022-10-26 NOTE — Anesthesia Postprocedure Evaluation (Signed)
Anesthesia Post Note  Patient: Dean Nichols  Procedure(s) Performed: DEBRIDEMENT RIGHT LEG, THIGH, AND FOOT (Right: Leg Lower)     Anesthesia Type: General Anesthetic complications: no   No notable events documented.  Last Vitals:  Vitals:   10/26/22 0518 10/26/22 0818  BP: 121/76 124/76  Pulse: 77 68  Resp: 18 18  Temp: 36.8 C 36.6 C  SpO2: 98% 99%    Last Pain:  Vitals:   10/26/22 0911  TempSrc:   PainSc: 3                  Lannie Fields

## 2022-10-26 NOTE — Progress Notes (Signed)
Subjective:  No new complaints   Antibiotics:  Anti-infectives (From admission, onward)    Start     Dose/Rate Route Frequency Ordered Stop   10/25/22 1550  vancomycin (VANCOCIN) powder  Status:  Discontinued          As needed 10/25/22 1551 10/25/22 1612   10/25/22 0600  ceFAZolin (ANCEF) IVPB 2g/100 mL premix        2 g 200 mL/hr over 30 Minutes Intravenous On call to O.R. 10/24/22 1617 10/25/22 1527   10/20/22 2200  linezolid (ZYVOX) tablet 600 mg        600 mg Oral Every 12 hours 10/20/22 1018     10/19/22 1300  penicillin G potassium 12 Million Units in dextrose 5 % 500 mL CONTINUOUS infusion        12 Million Units 41.7 mL/hr over 12 Hours Intravenous Every 12 hours 10/19/22 1213     10/19/22 1300  linezolid (ZYVOX) IVPB 600 mg  Status:  Discontinued        600 mg 300 mL/hr over 60 Minutes Intravenous Every 12 hours 10/19/22 1213 10/20/22 1018   10/19/22 0600  vancomycin (VANCOCIN) IVPB 1000 mg/200 mL premix  Status:  Discontinued        1,000 mg 200 mL/hr over 60 Minutes Intravenous Every 12 hours 10/18/22 1734 10/19/22 1213   10/19/22 0000  ceFEPIme (MAXIPIME) 2 g in sodium chloride 0.9 % 100 mL IVPB  Status:  Discontinued        2 g 200 mL/hr over 30 Minutes Intravenous Every 8 hours 10/18/22 1734 10/19/22 1213   10/18/22 2130  vancomycin (VANCOCIN) IVPB 1000 mg/200 mL premix  Status:  Discontinued        1,000 mg 200 mL/hr over 60 Minutes Intravenous  Once 10/18/22 2033 10/18/22 2047   10/18/22 1630  ceFEPIme (MAXIPIME) 2 g in sodium chloride 0.9 % 100 mL IVPB        2 g 200 mL/hr over 30 Minutes Intravenous  Once 10/18/22 1616 10/18/22 1820   10/18/22 1630  metroNIDAZOLE (FLAGYL) IVPB 500 mg        500 mg 100 mL/hr over 60 Minutes Intravenous  Once 10/18/22 1616 10/18/22 1819   10/18/22 1630  vancomycin (VANCOCIN) IVPB 1000 mg/200 mL premix        1,000 mg 200 mL/hr over 60 Minutes Intravenous  Once 10/18/22 1616 10/18/22 1924        Medications: Scheduled Meds:  acetaminophen  1,000 mg Oral Q8H   vitamin C  1,000 mg Oral Daily   docusate sodium  100 mg Oral BID   linezolid  600 mg Oral Q12H   mupirocin ointment  1 Application Nasal BID   nutrition supplement (JUVEN)  1 packet Oral BID BM   pantoprazole  40 mg Oral Daily   zinc sulfate  220 mg Oral Daily   Continuous Infusions:  sodium chloride     magnesium sulfate bolus IVPB     methocarbamol (ROBAXIN) IV     penicillin G potassium 12 Million Units in dextrose 5 % 500 mL CONTINUOUS infusion 12 Million Units (10/26/22 0907)   PRN Meds:.acetaminophen **FOLLOWED BY** [START ON 10/30/2022] acetaminophen, alum & mag hydroxide-simeth, bisacodyl, bisacodyl, diphenhydrAMINE, guaiFENesin-dextromethorphan, hydrALAZINE, HYDROmorphone (DILAUDID) injection, ibuprofen, labetalol, magnesium citrate, magnesium sulfate bolus IVPB, methocarbamol **OR** methocarbamol (ROBAXIN) IV, metoprolol tartrate, ondansetron, oxyCODONE **OR** oxyCODONE, phenol, polyethylene glycol, potassium chloride    Objective: Weight change:   Intake/Output Summary (Last  24 hours) at 10/26/2022 1744 Last data filed at 10/26/2022 1641 Gross per 24 hour  Intake 720 ml  Output 2375 ml  Net -1655 ml   Blood pressure 126/73, pulse 85, temperature (!) 97.5 F (36.4 C), temperature source Oral, resp. rate 18, height 6' (1.829 m), weight 75.1 kg, SpO2 99%. Temp:  [97.5 F (36.4 C)-98.2 F (36.8 C)] 97.5 F (36.4 C) (10/10 1634) Pulse Rate:  [55-150] 85 (10/10 1634) Resp:  [18] 18 (10/10 1634) BP: (101-127)/(49-76) 126/73 (10/10 1634) SpO2:  [95 %-99 %] 99 % (10/10 1634)  Physical Exam: Physical Exam Constitutional:      Appearance: He is well-developed.  HENT:     Head: Normocephalic and atraumatic.  Eyes:     Conjunctiva/sclera: Conjunctivae normal.  Cardiovascular:     Rate and Rhythm: Normal rate and regular rhythm.  Pulmonary:     Effort: Pulmonary effort is normal. No respiratory  distress.     Breath sounds: No wheezing.  Abdominal:     General: There is no distension.     Palpations: Abdomen is soft.  Musculoskeletal:     Cervical back: Normal range of motion and neck supple.  Skin:    General: Skin is warm and dry.     Findings: No erythema or rash.  Neurological:     General: No focal deficit present.     Mental Status: He is alert and oriented to person, place, and time.  Psychiatric:        Mood and Affect: Mood normal.        Behavior: Behavior normal.        Thought Content: Thought content normal.        Judgment: Judgment normal.     Right lower extremity wrapped  CBC:    BMET Recent Labs    10/24/22 0522 10/25/22 0531 10/26/22 0805  NA 138  --  135  K 4.0  --  4.3  CL 102  --  98  CO2 27  --  27  GLUCOSE 94  --  105*  BUN 15  --  11  CREATININE 1.01 0.97 1.02  CALCIUM 8.0*  --  8.8*     Liver Panel  Recent Labs    10/24/22 0522 10/26/22 0805  PROT 5.3* 6.7  ALBUMIN 2.1* 2.7*  AST 84* 58*  ALT 152* 131*  ALKPHOS 165* 190*  BILITOT 0.4 0.6       Sedimentation Rate No results for input(s): "ESRSEDRATE" in the last 72 hours. C-Reactive Protein No results for input(s): "CRP" in the last 72 hours.  Micro Results: Recent Results (from the past 720 hour(s))  Culture, blood (Routine x 2)     Status: Abnormal   Collection Time: 10/18/22  3:26 PM   Specimen: BLOOD LEFT ARM  Result Value Ref Range Status   Specimen Description   Final    BLOOD LEFT ARM Performed at [facility], [address], [place], [place] [address]    Special Requests   Final    BOTTLES DRAWN AEROBIC AND ANAEROBIC Blood Culture adequate volume Performed at [facility], [address], [place], [place] [address]    Culture  Setup Time   Final    GRAM POSITIVE COCCI IN CHAINS ANAEROBIC BOTTLE ONLY CRITICAL RESULT CALLED TO, READ BACK BY AND VERIFIED WITH: PHARMD BAILEY A 1153 FCP    Culture (A)  Final     STREPTOCOCCUS PYOGENES HEALTH DEPARTMENT NOTIFIED Performed at [facility], [address] [place], [place] [address]    Report Status 10/21/2022 FINAL  Final   Organism ID, Bacteria STREPTOCOCCUS PYOGENES  Final      Susceptibility   Streptococcus pyogenes - MIC*    PENICILLIN <=0.06 SENSITIVE Sensitive     CEFTRIAXONE <=0.12 SENSITIVE Sensitive     ERYTHROMYCIN <=0.12 SENSITIVE Sensitive     LEVOFLOXACIN 0.5 SENSITIVE Sensitive     VANCOMYCIN 0.25 SENSITIVE Sensitive     * STREPTOCOCCUS PYOGENES  Blood Culture ID Panel (Reflexed)     Status: Abnormal   Collection Time: 10/18/22  3:26 PM  Result Value Ref Range Status   Enterococcus faecalis NOT DETECTED NOT DETECTED Final   Enterococcus Faecium NOT DETECTED NOT DETECTED Final   Listeria monocytogenes NOT DETECTED NOT DETECTED Final   Staphylococcus species NOT DETECTED NOT DETECTED Final   Staphylococcus aureus (BCID) NOT DETECTED NOT DETECTED Final   Staphylococcus epidermidis NOT DETECTED NOT DETECTED Final   Staphylococcus lugdunensis NOT DETECTED NOT DETECTED Final   Streptococcus species DETECTED (A) NOT DETECTED Final    Comment: CRITICAL RESULT CALLED TO, READ BACK BY AND VERIFIED WITH: PHARMD BAILEY A 1153 FCP    Streptococcus agalactiae NOT DETECTED NOT DETECTED Final   Streptococcus pneumoniae NOT DETECTED NOT DETECTED Final   Streptococcus pyogenes DETECTED (A) NOT DETECTED Final    Comment: CRITICAL RESULT CALLED TO, READ BACK BY AND VERIFIED WITH: PHARMD BAILEY A 1153 FCP    A.calcoaceticus-baumannii NOT DETECTED NOT DETECTED Final   Bacteroides fragilis NOT DETECTED NOT DETECTED Final   Enterobacterales NOT DETECTED NOT DETECTED Final   Enterobacter cloacae complex NOT DETECTED NOT DETECTED Final   Escherichia coli NOT DETECTED NOT DETECTED Final   Klebsiella aerogenes NOT DETECTED NOT DETECTED Final   Klebsiella oxytoca NOT DETECTED NOT DETECTED Final   Klebsiella pneumoniae NOT DETECTED NOT DETECTED  Final   Proteus species NOT DETECTED NOT DETECTED Final   Salmonella species NOT DETECTED NOT DETECTED Final   Serratia marcescens NOT DETECTED NOT DETECTED Final   Haemophilus influenzae NOT DETECTED NOT DETECTED Final   Neisseria meningitidis NOT DETECTED NOT DETECTED Final   Pseudomonas aeruginosa NOT DETECTED NOT DETECTED Final   Stenotrophomonas maltophilia NOT DETECTED NOT DETECTED Final   Candida albicans NOT DETECTED NOT DETECTED Final   Candida auris NOT DETECTED NOT DETECTED Final   Candida glabrata NOT DETECTED NOT DETECTED Final   Candida krusei NOT DETECTED NOT DETECTED Final   Candida parapsilosis NOT DETECTED NOT DETECTED Final   Candida tropicalis NOT DETECTED NOT DETECTED Final   Cryptococcus neoformans/gattii NOT DETECTED NOT DETECTED Final    Comment: Performed at [facility], [address], [place], [place] [address]  Culture, blood (Routine x 2)     Status: None   Collection Time: 10/18/22  3:31 PM   Specimen: BLOOD  Result Value Ref Range Status   Specimen Description   Final    BLOOD LEFT ANTECUBITAL Performed at [facility], [address], [place], [place] [address]    Special Requests   Final    BOTTLES DRAWN AEROBIC AND ANAEROBIC Blood Culture adequate volume Performed at [facility], [address], [place], [place] [address]    Culture   Final    NO GROWTH 5 DAYS Performed at [facility], [address] [place], [place] [address]    Report Status 10/23/2022 FINAL  Final  Culture, blood (Routine X 2) w Reflex to ID Panel  Status: None   Collection Time: 10/20/22  5:52 AM   Specimen: BLOOD  Result Value Ref Range Status   Specimen Description BLOOD BLOOD RIGHT ARM  Final   Special Requests   Final    BOTTLES DRAWN AEROBIC AND ANAEROBIC Blood Culture adequate volume   Culture   Final    NO GROWTH 5 DAYS Performed at [facility], [address], [place], [place] [address]    Report  Status 10/25/2022 FINAL  Final  Culture, blood (Routine X 2) w Reflex to ID Panel     Status: None   Collection Time: 10/20/22  5:57 AM   Specimen: BLOOD  Result Value Ref Range Status   Specimen Description BLOOD BLOOD RIGHT ARM  Final   Special Requests   Final    BOTTLES DRAWN AEROBIC AND ANAEROBIC Blood Culture adequate volume   Culture   Final    NO GROWTH 5 DAYS Performed at [facility], [address], [place], [place] [address]    Report Status 10/25/2022 FINAL  Final  Aerobic/Anaerobic Culture w Gram Stain (surgical/deep wound)     Status: None (Preliminary result)   Collection Time: 10/22/22  2:51 PM   Specimen: Soft Tissue, Other  Result Value Ref Range Status   Specimen Description TISSUE  Final   Special Requests right gastroc fascia  Final   Gram Stain NO WBC SEEN NO ORGANISMS SEEN   Final   Culture   Final    NO GROWTH 4 DAYS NO ANAEROBES ISOLATED; CULTURE IN PROGRESS FOR 5 DAYS Performed at [facility], [address], [place], [place] [address]    Report Status PENDING  Incomplete    Studies/Results: No results found.    Assessment/Plan:  INTERVAL HISTORY: Patient is now status post repeat surgery with Dr. Lajoyce Corners with extension of the incision and removal of unhealthy tissue and placement of graft.     Principal Problem:   Sepsis due to cellulitis [facility]) Active Problems:   AKI (acute kidney injury) (HCC)   Hyponatremia   Necrotizing fasciitis due to Streptococcus pyogenes (HCC)   Sepsis due to group A Streptococcus with acute organ dysfunction and septic shock (HCC)    Dean Nichols is a 27 y.o. male with necrotizing fasciitis and group A streptococcal bacteremia, sepsis including shock liver, status post I&D plans for him to return to the operating room later this week.   He is on penicillin and Zyvox and had repeat debridement.  Likely going to the operating room for closure tomorrow.  I will stop his Zyvox after that final  surgery and plan on giving him a course of targeted therapy for the group A streptococcal infection   I have personally spent 52 minutes involved in face-to-face and non-face-to-face activities for this patient on the day of the visit. Professional time spent includes the following activities: Preparing to see the patient (review of tests), Obtaining and/or reviewing separately obtained history (admission/discharge record), Performing a medically appropriate examination and/or evaluation , Ordering medications/tests/procedures, referring and communicating with other health care professionals, Documenting clinical information in the EMR, Independently interpreting results (not separately reported), Communicating results to the patient/family/caregiver, Counseling and educating the patient/family/caregiver and Care coordination (not separately reported).         LOS: 8 days   Acey Lav 10/26/2022, 5:44 PM

## 2022-10-26 NOTE — Progress Notes (Signed)
Gauze and abd pad changed

## 2022-10-26 NOTE — Consult Note (Signed)
WOC consulted for clogged NPWT dressing, surgery 10/19; no orders to change NPWT dressing. Kerasis micro graft and cleanse choice sponges x 3.  Alerted Dr. Lajoyce Corners this am to assist/guide staff on care of dressing. This dressing is not routinely changed until surgeon orders or changes.  Bedside nursing aware of same    Re consult if needed, will not follow at this time. Thanks  Lissie Hinesley [employer], RN,CWOCN, CNS, CWON-AP [phone])

## 2022-10-26 NOTE — Evaluation (Signed)
Physical Therapy Evaluation Patient Details Name: Dean Nichols MRN: [id number] DOB: [birth year] Today's Date: 10/26/2022  History of Present Illness  27 yo male admitted 10/2 with RLE pain, edema with sepsis and cellulits after cutting leg on metal 9/30. Pt with necrotizing fasciitis s/p debridement of Rt calf and thigh 10/6 & 10/9. PMhx; Rt ACL tear  Clinical Impression  PT pleasant with blood draining from posterior calf and thigh despite wound VAC. Pt with limited activity tolerance due to dizziness, tachycardia and SOB with SPO2 95% on RA, HR 150 max and bp 104/74 (85) in sitting to 101/49 (64) standing. Pt with decreased strength, ROM and activity tolerance who will benefit from acute therapy to maximize mobility, safety and independence. Pt states mom's house is WC accessible on first floor and if unable to walk may need to utilize WC mobility at D/C but hopeful for activity and gait progression to allow home with RW and HHPT. Will continue to follow and encouraged pt to ask for medication as needed.         If plan is discharge home, recommend the following: A lot of help with bathing/dressing/bathroom;Assistance with cooking/housework;Assist for transportation;Help with stairs or ramp for entrance   Can travel by private vehicle        Equipment Recommendations Rolling walker (2 wheels);Wheelchair (measurements PT)  Recommendations for Other Services  OT consult    Functional Status Assessment Patient has had a recent decline in their functional status and demonstrates the ability to make significant improvements in function in a reasonable and predictable amount of time.     Precautions / Restrictions Precautions Precautions: Fall;Other (comment) Precaution Comments: RLE wound vac with leakage      Mobility  Bed Mobility Overal bed mobility: Needs Assistance Bed Mobility: Supine to Sit, Sit to Supine     Supine to sit: Supervision Sit to supine: Supervision   General  bed mobility comments: supervision for lines with pt able to transition to and from supine without physical assist    Transfers Overall transfer level: Needs assistance   Transfers: Sit to/from Stand Sit to Stand: Supervision           General transfer comment: cues for hand placement, pt able to rise x 3 trials from bed and grasp RW in standing. Pt able to take single step forward and back and side step toward HOB. Pt limited by SOB, tachycardia and dizziness with BP 101/49 in standing and HR 150    Ambulation/Gait               General Gait Details: not yet able  Stairs            Wheelchair Mobility     Tilt Bed    Modified Rankin (Stroke Patients Only)       Balance Overall balance assessment: Mild deficits observed, not formally tested                                           Pertinent Vitals/Pain Pain Assessment Pain Assessment: 0-10 Pain Score: 7  Pain Location: calf Pain Descriptors / Indicators: Aching, Guarding Pain Intervention(s): Limited activity within patient's tolerance, Repositioned, Monitored during session, Patient requesting pain meds-RN notified    Home Living Family/patient expects to be discharged to:: Private residence Living Arrangements: Parent Available Help at Discharge: Family;Available PRN/intermittently Type of Home: House Home Access: Stairs  to enter Entrance Stairs-Rails: Right Entrance Stairs-Number of Steps: 3-5 Alternate Level Stairs-Number of Steps: 14 Home Layout: Two level;1/2 bath on main level;Able to live on main level with bedroom/bathroom Home Equipment: Crutches Additional Comments: pt normally lives in 3rd floor apartment with dad who is a Naval architect, plans to stay with mom in 2 story house with couch downstairs and siblings able to assist    Prior Function Prior Level of Function : Independent/Modified Independent;Working/employed;Driving             Mobility Comments:  Independent, working for [employer]       Extremity/Trunk Assessment   Upper Extremity Assessment Upper Extremity Assessment: Overall WFL for tasks assessed    Lower Extremity Assessment Lower Extremity Assessment: RLE deficits/detail RLE Deficits / Details: pt with edema, decreased ROM and strength due to pain and wound VAC    Cervical / Trunk Assessment Cervical / Trunk Assessment: Normal  Communication   Communication Communication: No apparent difficulties  Cognition Arousal: Alert Behavior During Therapy: WFL for tasks assessed/performed Overall Cognitive Status: Within Functional Limits for tasks assessed                                          General Comments      Exercises     Assessment/Plan    PT Assessment Patient needs continued PT services  PT Problem List Decreased strength;Decreased mobility;Decreased range of motion;Decreased activity tolerance;Decreased balance;Decreased knowledge of use of DME;Pain       PT Treatment Interventions DME instruction;Gait training;Stair training;Functional mobility training;Therapeutic activities;Patient/family education;Neuromuscular re-education;Balance training;Therapeutic exercise    PT Goals (Current goals can be found in the Care Plan section)  Acute Rehab PT Goals Patient Stated Goal: return home, work, watch tv PT Goal Formulation: With patient Time For Goal Achievement: 11/09/22 Potential to Achieve Goals: Good    Frequency Min 1X/week     Co-evaluation               AM-PAC PT "6 Clicks" Mobility  Outcome Measure Help needed turning from your back to your side while in a flat bed without using bedrails?: A Little Help needed moving from lying on your back to sitting on the side of a flat bed without using bedrails?: A Little Help needed moving to and from a bed to a chair (including a wheelchair)?: A Lot Help needed standing up from a chair using your arms (e.g., wheelchair or bedside  chair)?: A Little Help needed to walk in hospital room?: Total Help needed climbing 3-5 steps with a railing? : Total 6 Click Score: 13    End of Session   Activity Tolerance: Patient limited by fatigue;Patient limited by pain Patient left: in bed;with call bell/phone within reach;with bed alarm set Nurse Communication: Mobility status;Patient requests pain meds PT Visit Diagnosis: Other abnormalities of gait and mobility (R26.89);Muscle weakness (generalized) (M62.81)    Time: 7829-5621 PT Time Calculation (min) (ACUTE ONLY): 36 min   Charges:   PT Evaluation $PT Eval Moderate Complexity: 1 Mod PT Treatments $Therapeutic Activity: 8-22 mins PT General Charges $$ ACUTE PT VISIT: 1 Visit         Merryl Hacker, PT Acute Rehabilitation Services Office: [phone]   Enedina Finner Lynore Coscia 10/26/2022, 11:34 AM

## 2022-10-26 NOTE — Plan of Care (Addendum)
0930: Pt seen by Dr.Duda this AM, MD assessed pt RLE and dressings. RN notified Dr.Duda that overnight nightshift had to change re-enforcement wrap 3 times due to bleeding, and drainage had filled the original wound vac cannister as well so it had to be changed early this AM on nightshift. MD aware, per Dr.Duda MD it should be draining this much, RN will continue to monitor and re-enforce as needed.

## 2022-10-26 NOTE — Progress Notes (Signed)
PROGRESS NOTE    Dean Nichols  OZH:[id number] DOB: 1995/09/03 DOA: 10/18/2022 PCP: Pcp, No   Brief Narrative:  This 27 yrs old male with medical history significant of previous history of ACL tear of the right knee joint, otherwise no significant medical history who was working apparently in Aeronautical engineer.  He was working in the yard when he accidentally cut his leg around the lateral aspect of the right knee with a metal pipe.  He presented to the ED from urgent care with fevers, chills, nausea, vomiting -> sepsis -> was admitted for sepsis due to right lower extremity cellulitis. He is found to have necrotizing fasciitis.  ID and ortho following.  Plan for OR 10/9 and 10/11 (s/p R leg debridement on 10/6).   Assessment & Plan:   Principal Problem:   Sepsis due to cellulitis [facility]) Active Problems:   AKI (acute kidney injury) (HCC)   Hyponatremia   Necrotizing fasciitis due to Streptococcus pyogenes (HCC)   Sepsis due to group A Streptococcus with acute organ dysfunction and septic shock (HCC)  Sepsis  / Streptococcus pyogenes bacteremia: Necrotizing fasciitis of right lower extremity Met criteria for sepsis on presentation, continues to meet criteria for sepsis with tachycardia, fever, leukocytosis. He spiked fever on 10/5, leukocytosis is persistent. Blood cultures showed Strep pyogenes in 1/2 bottles. Repeat cultures from 10/4 Ng x 2 Appreciate ortho recs - s/p R leg excisional debridement, necrotizing fasciitis of thigh and calf  Status post repeat debridement for necrotizing fasciitis.  POD 1 Tissue margins were clear and anticipate closure of wounds tomorrow. Surgical culture 10/6 Neg x 2 days  Penicillin and linezolid (per ID, likely to d/c zyvox after surgery ) ID c/s -> appreciate recs. Echocardiogram EF 55-60%, no RWMA S/p Tdap 10/2 in ED.   Left Upper Lip Ulceration: Possibly related to surgery. Continue to monitor.   Elevated LFT's: ? Shock liver RUQ [place] without  concerning findings of liver and gallbladder. Acute hepatitis panel  negative Fluctuating, Continue to monitor   Acute kidney Injury: > Resolved. Baseline creatinine unknown Suspect due to sepsis Follow renal US (normal), UA unrevealing   Hypokalemia: Replaced and resolved.   Leukocytosis: Likely secondary to above. Continue antibiotics.  DVT prophylaxis: Lovenox Code Status: Full code Family Communication: Spoke with mother on the phone. Disposition Plan: Status is: Inpatient Remains inpatient appropriate because: Admitted for necrotizing fasciitis is scheduled for OR today.    Consultants:  Infectious diseases Orthopedics  Procedures: Antimicrobials: Anti-infectives (From admission, onward)    Start     Dose/Rate Route Frequency Ordered Stop   10/25/22 1550  vancomycin (VANCOCIN) powder  Status:  Discontinued          As needed 10/25/22 1551 10/25/22 1612   10/25/22 0600  ceFAZolin (ANCEF) IVPB 2g/100 mL premix        2 g 200 mL/hr over 30 Minutes Intravenous On call to O.R. 10/24/22 1617 10/25/22 1527   10/20/22 2200  linezolid (ZYVOX) tablet 600 mg        600 mg Oral Every 12 hours 10/20/22 1018     10/19/22 1300  penicillin G potassium 12 Million Units in dextrose 5 % 500 mL CONTINUOUS infusion        12 Million Units 41.7 mL/hr over 12 Hours Intravenous Every 12 hours 10/19/22 1213     10/19/22 1300  linezolid (ZYVOX) IVPB 600 mg  Status:  Discontinued        600 mg 300 mL/hr over 60 Minutes  Intravenous Every 12 hours 10/19/22 1213 10/20/22 1018   10/19/22 0600  vancomycin (VANCOCIN) IVPB 1000 mg/200 mL premix  Status:  Discontinued        1,000 mg 200 mL/hr over 60 Minutes Intravenous Every 12 hours 10/18/22 1734 10/19/22 1213   10/19/22 0000  ceFEPIme (MAXIPIME) 2 g in sodium chloride 0.9 % 100 mL IVPB  Status:  Discontinued        2 g 200 mL/hr over 30 Minutes Intravenous Every 8 hours 10/18/22 1734 10/19/22 1213   10/18/22 2130  vancomycin (VANCOCIN) IVPB  1000 mg/200 mL premix  Status:  Discontinued        1,000 mg 200 mL/hr over 60 Minutes Intravenous  Once 10/18/22 2033 10/18/22 2047   10/18/22 1630  ceFEPIme (MAXIPIME) 2 g in sodium chloride 0.9 % 100 mL IVPB        2 g 200 mL/hr over 30 Minutes Intravenous  Once 10/18/22 1616 10/18/22 1820   10/18/22 1630  metroNIDAZOLE (FLAGYL) IVPB 500 mg        500 mg 100 mL/hr over 60 Minutes Intravenous  Once 10/18/22 1616 10/18/22 1819   10/18/22 1630  vancomycin (VANCOCIN) IVPB 1000 mg/200 mL premix        1,000 mg 200 mL/hr over 60 Minutes Intravenous  Once 10/18/22 1616 10/18/22 1924      Subjective: Patient was seen and examined at bedside.Overnight events noted.   Patient reports doing better.  He is status post debridement for the necrotizing fasciitis. Right leg still appears swollen,  covered in dressing.  Objective: Vitals:   10/25/22 2024 10/26/22 0518 10/26/22 0818 10/26/22 1128  BP: 127/76 121/76 124/76 (!) 101/49  Pulse: (!) 55 77 68 (!) 150  Resp: 18 18 18    Temp: 97.7 F (36.5 C) 98.2 F (36.8 C) 97.9 F (36.6 C)   TempSrc: Oral  Oral   SpO2: 98% 98% 99% 95%  Weight:      Height:        Intake/Output Summary (Last 24 hours) at 10/26/2022 1251 Last data filed at 10/26/2022 0833 Gross per 24 hour  Intake 500 ml  Output 1410 ml  Net -910 ml   Filed Weights   10/18/22 1518 10/19/22 0350  Weight: 74.8 kg 75.1 kg    Examination:  General exam: Appears calm and comfortable , not in any acute distress. Respiratory system: CTA bilaterally. Respiratory effort normal.  RR 15 Cardiovascular system: S1 & S2 heard, RRR. No JVD, murmurs, rubs, gallops or clicks.  Gastrointestinal system: Abdomen is nondistended, soft and nontender. Normal bowel sounds heard. Central nervous system: Alert and oriented x 3. No focal neurological deficits. Extremities: RLE swollen, tender, covered in dressing, blood noted in wound vac. Skin: No rashes, lesions or ulcers Psychiatry:  Judgement and insight appear normal. Mood & affect appropriate.     Data Reviewed: I have personally reviewed following labs and imaging studies  CBC: Recent Labs  Lab 10/20/22 0552 10/21/22 0551 10/22/22 0639 10/23/22 1008 10/24/22 0522 10/25/22 0531 10/25/22 2158 10/26/22 0805  WBC 19.9* 21.6* 17.2* 18.9* 13.6* 18.7* 29.4* 17.6*  NEUTROABS 18.5* 17.6* 12.2* 14.7*  --   --   --   --   HGB 12.0* 11.6* 11.8* 12.8* 11.4* 12.7* 12.1* 11.0*  HCT 32.7* 31.8* 32.4* 35.9* 32.2* 36.4* 34.9* 31.6*  MCV 87.4 84.8 84.6 85.7 87.0 86.7 87.7 88.3  PLT 172 206 231 298 296 388 447* 447*   Basic Metabolic Panel: Recent Labs  Lab  10/20/22 0552 10/21/22 0551 10/22/22 0639 10/23/22 1008 10/24/22 0522 10/25/22 0531 10/26/22 0805  NA 130* 132* 135 136 138  --  135  K 3.5 3.3* 3.4* 4.3 4.0  --  4.3  CL 98 100 101 100 102  --  98  CO2 24 24 25 26 27   --  27  GLUCOSE 112* 100* 100* 120* 94  --  105*  BUN 14 12 10 12 15   --  11  CREATININE 1.14 1.23 1.11 1.02 1.01 0.97 1.02  CALCIUM 7.8* 7.6* 7.6* 8.1* 8.0*  --  8.8*  MG 1.8 1.8 1.9 2.3 2.0  --   --   PHOS 1.4* 2.7 3.4 3.2 3.6  --   --    GFR: Estimated Creatinine Clearance: 114.5 mL/min (by C-G formula based on SCr of 1.02 mg/dL). Liver Function Tests: Recent Labs  Lab 10/21/22 0551 10/22/22 0639 10/23/22 1008 10/24/22 0522 10/26/22 0805  AST 157* 135* 67* 84* 58*  ALT 184* 182* 162* 152* 131*  ALKPHOS 105 183* 191* 165* 190*  BILITOT 1.2 0.9 0.5 0.4 0.6  PROT 5.1* 5.3* 5.8* 5.3* 6.7  ALBUMIN 2.0* 2.0* 2.1* 2.1* 2.7*   No results for input(s): "LIPASE", "AMYLASE" in the last 168 hours. No results for input(s): "AMMONIA" in the last 168 hours. Coagulation Profile: No results for input(s): "INR", "PROTIME" in the last 168 hours.  Cardiac Enzymes: No results for input(s): "CKTOTAL", "CKMB", "CKMBINDEX", "TROPONINI" in the last 168 hours. BNP (last 3 results) No results for input(s): "PROBNP" in the last 8760  hours. HbA1C: No results for input(s): "HGBA1C" in the last 72 hours. CBG: No results for input(s): "GLUCAP" in the last 168 hours. Lipid Profile: No results for input(s): "CHOL", "HDL", "LDLCALC", "TRIG", "CHOLHDL", "LDLDIRECT" in the last 72 hours. Thyroid Function Tests: No results for input(s): "TSH", "T4TOTAL", "FREET4", "T3FREE", "THYROIDAB" in the last 72 hours. Anemia Panel: No results for input(s): "VITAMINB12", "FOLATE", "FERRITIN", "TIBC", "IRON", "RETICCTPCT" in the last 72 hours. Sepsis Labs: Recent Labs  Lab 10/20/22 1453  LATICACIDVEN 1.2    Recent Results (from the past 240 hour(s))  Culture, blood (Routine x 2)     Status: Abnormal   Collection Time: 10/18/22  3:26 PM   Specimen: BLOOD LEFT ARM  Result Value Ref Range Status   Specimen Description   Final    BLOOD LEFT ARM Performed at [facility], [address] [place], [place] [address]    Special Requests   Final    BOTTLES DRAWN AEROBIC AND ANAEROBIC Blood Culture adequate volume Performed at [facility], [address], [place], [place] [address]    Culture  Setup Time   Final    GRAM POSITIVE COCCI IN CHAINS ANAEROBIC BOTTLE ONLY CRITICAL RESULT CALLED TO, READ BACK BY AND VERIFIED WITH: PHARMD BAILEY A 1153 FCP    Culture (A)  Final    STREPTOCOCCUS PYOGENES HEALTH DEPARTMENT NOTIFIED Performed at [facility], [address], [place], [place] [address]    Report Status 10/21/2022 FINAL  Final   Organism ID, Bacteria STREPTOCOCCUS PYOGENES  Final      Susceptibility   Streptococcus pyogenes - MIC*    PENICILLIN <=0.06 SENSITIVE Sensitive     CEFTRIAXONE <=0.12 SENSITIVE Sensitive     ERYTHROMYCIN <=0.12 SENSITIVE Sensitive     LEVOFLOXACIN 0.5 SENSITIVE Sensitive     VANCOMYCIN 0.25 SENSITIVE Sensitive     * STREPTOCOCCUS PYOGENES  Blood Culture ID Panel (Reflexed)  Status: Abnormal   Collection Time: 10/18/22  3:26 PM  Result Value Ref Range Status    Enterococcus faecalis NOT DETECTED NOT DETECTED Final   Enterococcus Faecium NOT DETECTED NOT DETECTED Final   Listeria monocytogenes NOT DETECTED NOT DETECTED Final   Staphylococcus species NOT DETECTED NOT DETECTED Final   Staphylococcus aureus (BCID) NOT DETECTED NOT DETECTED Final   Staphylococcus epidermidis NOT DETECTED NOT DETECTED Final   Staphylococcus lugdunensis NOT DETECTED NOT DETECTED Final   Streptococcus species DETECTED (A) NOT DETECTED Final    Comment: CRITICAL RESULT CALLED TO, READ BACK BY AND VERIFIED WITH: PHARMD BAILEY A 1153 FCP    Streptococcus agalactiae NOT DETECTED NOT DETECTED Final   Streptococcus pneumoniae NOT DETECTED NOT DETECTED Final   Streptococcus pyogenes DETECTED (A) NOT DETECTED Final    Comment: CRITICAL RESULT CALLED TO, READ BACK BY AND VERIFIED WITH: PHARMD BAILEY A 1153 FCP    A.calcoaceticus-baumannii NOT DETECTED NOT DETECTED Final   Bacteroides fragilis NOT DETECTED NOT DETECTED Final   Enterobacterales NOT DETECTED NOT DETECTED Final   Enterobacter cloacae complex NOT DETECTED NOT DETECTED Final   Escherichia coli NOT DETECTED NOT DETECTED Final   Klebsiella aerogenes NOT DETECTED NOT DETECTED Final   Klebsiella oxytoca NOT DETECTED NOT DETECTED Final   Klebsiella pneumoniae NOT DETECTED NOT DETECTED Final   Proteus species NOT DETECTED NOT DETECTED Final   Salmonella species NOT DETECTED NOT DETECTED Final   Serratia marcescens NOT DETECTED NOT DETECTED Final   Haemophilus influenzae NOT DETECTED NOT DETECTED Final   Neisseria meningitidis NOT DETECTED NOT DETECTED Final   Pseudomonas aeruginosa NOT DETECTED NOT DETECTED Final   Stenotrophomonas maltophilia NOT DETECTED NOT DETECTED Final   Candida albicans NOT DETECTED NOT DETECTED Final   Candida auris NOT DETECTED NOT DETECTED Final   Candida glabrata NOT DETECTED NOT DETECTED Final   Candida krusei NOT DETECTED NOT DETECTED Final   Candida parapsilosis NOT DETECTED NOT  DETECTED Final   Candida tropicalis NOT DETECTED NOT DETECTED Final   Cryptococcus neoformans/gattii NOT DETECTED NOT DETECTED Final    Comment: Performed at [facility], [address], [place], [place] [address]  Culture, blood (Routine x 2)     Status: None   Collection Time: 10/18/22  3:31 PM   Specimen: BLOOD  Result Value Ref Range Status   Specimen Description   Final    BLOOD LEFT ANTECUBITAL Performed at [facility], [address] [place], [place] [address]    Special Requests   Final    BOTTLES DRAWN AEROBIC AND ANAEROBIC Blood Culture adequate volume Performed at [facility], [address], [place], [place] [address]    Culture   Final    NO GROWTH 5 DAYS Performed at [facility], [address], [place], [place] [address]    Report Status 10/23/2022 FINAL  Final  Culture, blood (Routine X 2) w Reflex to ID Panel     Status: None   Collection Time: 10/20/22  5:52 AM   Specimen: BLOOD  Result Value Ref Range Status   Specimen Description BLOOD BLOOD RIGHT ARM  Final   Special Requests   Final    BOTTLES DRAWN AEROBIC AND ANAEROBIC Blood Culture adequate volume   Culture   Final    NO GROWTH 5 DAYS Performed at [facility], [address] [place], [place] [address]    Report Status 10/25/2022 FINAL  Final  Culture, blood (Routine X 2)  w Reflex to ID Panel     Status: None   Collection Time: 10/20/22  5:57 AM   Specimen: BLOOD  Result Value Ref Range Status   Specimen Description BLOOD BLOOD RIGHT ARM  Final   Special Requests   Final    BOTTLES DRAWN AEROBIC AND ANAEROBIC Blood Culture adequate volume   Culture   Final    NO GROWTH 5 DAYS Performed at [facility], [address], [place], [place] [address]    Report Status 10/25/2022 FINAL  Final  Aerobic/Anaerobic Culture w Gram Stain (surgical/deep wound)     Status: None (Preliminary result)   Collection Time: 10/22/22  2:51 PM    Specimen: Soft Tissue, Other  Result Value Ref Range Status   Specimen Description TISSUE  Final   Special Requests right gastroc fascia  Final   Gram Stain NO WBC SEEN NO ORGANISMS SEEN   Final   Culture   Final    NO GROWTH 4 DAYS NO ANAEROBES ISOLATED; CULTURE IN PROGRESS FOR 5 DAYS Performed at [facility], [address] [place], [place] [address]    Report Status PENDING  Incomplete    Radiology Studies: No results found.  Scheduled Meds:  acetaminophen  1,000 mg Oral Q8H   vitamin C  1,000 mg Oral Daily   docusate sodium  100 mg Oral BID   docusate sodium  100 mg Oral Daily   linezolid  600 mg Oral Q12H   mupirocin ointment  1 Application Nasal BID   nutrition supplement (JUVEN)  1 packet Oral BID BM   pantoprazole  40 mg Oral Daily   polyethylene glycol  17 g Oral BID   zinc sulfate  220 mg Oral Daily   Continuous Infusions:  sodium chloride     magnesium sulfate bolus IVPB     methocarbamol (ROBAXIN) IV     penicillin G potassium 12 Million Units in dextrose 5 % 500 mL CONTINUOUS infusion 12 Million Units (10/26/22 0907)     LOS: 8 days    Time spent: 35 mins    Willeen Niece, MD [facility]   If 7PM-7AM, please contact night-coverage

## 2022-10-27 ENCOUNTER — Inpatient Hospital Stay (HOSPITAL_COMMUNITY): Payer: Commercial Managed Care - HMO | Admitting: Anesthesiology

## 2022-10-27 ENCOUNTER — Other Ambulatory Visit: Payer: Self-pay

## 2022-10-27 ENCOUNTER — Encounter (HOSPITAL_COMMUNITY): Payer: Self-pay | Admitting: Obstetrics and Gynecology

## 2022-10-27 ENCOUNTER — Encounter (HOSPITAL_COMMUNITY)
Admission: EM | Disposition: A | Discharge status: Home Health | Payer: Self-pay | Source: Home / Self Care | Attending: Family Medicine

## 2022-10-27 DIAGNOSIS — B950 Streptococcus, group A, as the cause of diseases classified elsewhere: Secondary | ICD-10-CM | POA: Diagnosis not present

## 2022-10-27 DIAGNOSIS — L0390 Cellulitis, unspecified: Secondary | ICD-10-CM | POA: Diagnosis not present

## 2022-10-27 DIAGNOSIS — M726 Necrotizing fasciitis: Secondary | ICD-10-CM

## 2022-10-27 DIAGNOSIS — A419 Sepsis, unspecified organism: Secondary | ICD-10-CM | POA: Diagnosis not present

## 2022-10-27 HISTORY — PX: I & D EXTREMITY: SHX5045

## 2022-10-27 HISTORY — PX: APPLICATION OF WOUND VAC: SHX5189

## 2022-10-27 LAB — SURGICAL PCR SCREEN
MRSA, PCR: NEGATIVE
Staphylococcus aureus: NEGATIVE

## 2022-10-27 LAB — AEROBIC/ANAEROBIC CULTURE W GRAM STAIN (SURGICAL/DEEP WOUND)
Culture: NO GROWTH
Gram Stain: NONE SEEN

## 2022-10-27 LAB — BASIC METABOLIC PANEL
Anion gap: 10 (ref 5–15)
BUN: 18 mg/dL (ref 6–20)
CO2: 27 mmol/L (ref 22–32)
Calcium: 9 mg/dL (ref 8.9–10.3)
Chloride: 98 mmol/L (ref 98–111)
Creatinine, Ser: 1.04 mg/dL (ref 0.61–1.24)
GFR, Estimated: >60 mL/min (ref >60)
Glucose, Bld: 106 mg/dL — ABNORMAL HIGH (ref 70–99)
Potassium: 4.3 mmol/L (ref 3.5–5.1)
Sodium: 135 mmol/L (ref 135–145)

## 2022-10-27 LAB — CBC
HCT: 30.6 % — ABNORMAL LOW (ref 39.0–52.0)
Hemoglobin: 10.7 g/dL — ABNORMAL LOW (ref 13.0–17.0)
MCH: 31.3 pg (ref 26.0–34.0)
MCHC: 35 g/dL (ref 30.0–36.0)
MCV: 89.5 fL (ref 80.0–100.0)
Platelets: 465 10*3/uL — ABNORMAL HIGH (ref 150–400)
RBC: 3.42 MIL/uL — ABNORMAL LOW (ref 4.22–5.81)
RDW: 13.2 % (ref 11.5–15.5)
WBC: 15.4 10*3/uL — ABNORMAL HIGH (ref 4.0–10.5)
nRBC: 0 % (ref 0.0–0.2)

## 2022-10-27 LAB — MAGNESIUM: Magnesium: 2.1 mg/dL (ref 1.7–2.4)

## 2022-10-27 LAB — PHOSPHORUS: Phosphorus: 3.2 mg/dL (ref 2.5–4.6)

## 2022-10-27 SURGERY — IRRIGATION AND DEBRIDEMENT EXTREMITY
Anesthesia: General | Site: Leg Lower | Laterality: Right

## 2022-10-27 MED ORDER — ROCURONIUM BROMIDE 10 MG/ML (PF) SYRINGE
PREFILLED_SYRINGE | INTRAVENOUS | Status: AC
  Filled 2022-10-27: qty 10

## 2022-10-27 MED ORDER — ACETAMINOPHEN 160 MG/5ML PO SOLN: 325.0000 mg | ORAL | Status: DC | PRN

## 2022-10-27 MED ORDER — ONDANSETRON HCL 4 MG/2ML IJ SOLN
INTRAMUSCULAR | Status: DC | PRN
  Administered 2022-10-27: 4 mg via INTRAVENOUS

## 2022-10-27 MED ORDER — CHLORHEXIDINE GLUCONATE 4 % EX SOLN
60.0000 mL | Freq: Once | CUTANEOUS | Status: AC
  Administered 2022-10-27: 4 via TOPICAL
  Filled 2022-10-27: qty 15

## 2022-10-27 MED ORDER — CEFAZOLIN SODIUM-DEXTROSE 2-4 GM/100ML-% IV SOLN
2.0000 g | INTRAVENOUS | Status: AC
  Administered 2022-10-27: 2 g via INTRAVENOUS
  Filled 2022-10-27: qty 100

## 2022-10-27 MED ORDER — FENTANYL CITRATE (PF) 100 MCG/2ML IJ SOLN
INTRAMUSCULAR | Status: AC
  Filled 2022-10-27: qty 2

## 2022-10-27 MED ORDER — PHENYLEPHRINE HCL (PRESSORS) 10 MG/ML IV SOLN
INTRAVENOUS | Status: DC | PRN
Start: 2022-10-27 — End: 2022-10-27
  Administered 2022-10-27: 160 ug via INTRAVENOUS
  Administered 2022-10-27 (×3): 80 ug via INTRAVENOUS

## 2022-10-27 MED ORDER — FENTANYL CITRATE (PF) 250 MCG/5ML IJ SOLN
INTRAMUSCULAR | Status: AC
  Filled 2022-10-27: qty 5

## 2022-10-27 MED ORDER — ORAL CARE MOUTH RINSE: 15.0000 mL | Freq: Once | OROMUCOSAL | Status: AC

## 2022-10-27 MED ORDER — ONDANSETRON HCL 4 MG/2ML IJ SOLN
INTRAMUSCULAR | Status: AC
  Filled 2022-10-27: qty 2

## 2022-10-27 MED ORDER — DEXAMETHASONE SODIUM PHOSPHATE 10 MG/ML IJ SOLN
INTRAMUSCULAR | Status: AC
  Filled 2022-10-27: qty 1

## 2022-10-27 MED ORDER — DEXAMETHASONE SODIUM PHOSPHATE 10 MG/ML IJ SOLN
INTRAMUSCULAR | Status: DC | PRN
Start: 2022-10-27 — End: 2022-10-27
  Administered 2022-10-27: 10 mg via INTRAVENOUS

## 2022-10-27 MED ORDER — VASHE WOUND IRRIGATION OPTIME
TOPICAL | Status: DC | PRN
Start: 2022-10-27 — End: 2022-10-27
  Administered 2022-10-27 (×2): 34 [oz_av]

## 2022-10-27 MED ORDER — MIDAZOLAM HCL 2 MG/2ML IJ SOLN
INTRAMUSCULAR | Status: AC
  Filled 2022-10-27: qty 2

## 2022-10-27 MED ORDER — PROPOFOL 10 MG/ML IV BOLUS
INTRAVENOUS | Status: AC
  Filled 2022-10-27: qty 20

## 2022-10-27 MED ORDER — ONDANSETRON HCL 4 MG/2ML IJ SOLN: 4.0000 mg | Freq: Once | INTRAMUSCULAR | Status: DC | PRN

## 2022-10-27 MED ORDER — GLYCOPYRROLATE PF 0.2 MG/ML IJ SOSY
PREFILLED_SYRINGE | INTRAMUSCULAR | Status: AC
  Filled 2022-10-27: qty 1

## 2022-10-27 MED ORDER — FENTANYL CITRATE (PF) 100 MCG/2ML IJ SOLN
25.0000 ug | INTRAMUSCULAR | Status: DC | PRN
  Administered 2022-10-27 (×2): 50 ug via INTRAVENOUS

## 2022-10-27 MED ORDER — FENTANYL CITRATE (PF) 250 MCG/5ML IJ SOLN
INTRAMUSCULAR | Status: DC | PRN
  Administered 2022-10-27 (×5): 50 ug via INTRAVENOUS

## 2022-10-27 MED ORDER — ACETAMINOPHEN 325 MG PO TABS: 325.0000 mg | ORAL_TABLET | ORAL | Status: DC | PRN

## 2022-10-27 MED ORDER — POVIDONE-IODINE 10 % EX SWAB: 2.0000 | Freq: Once | CUTANEOUS | Status: AC

## 2022-10-27 MED ORDER — MEPERIDINE HCL 25 MG/ML IJ SOLN: 6.2500 mg | INTRAMUSCULAR | Status: DC | PRN

## 2022-10-27 MED ORDER — CHLORHEXIDINE GLUCONATE 0.12 % MT SOLN
15.0000 mL | Freq: Once | OROMUCOSAL | Status: AC
  Administered 2022-10-27: 15 mL via OROMUCOSAL
  Filled 2022-10-27 (×2): qty 15

## 2022-10-27 MED ORDER — 0.9 % SODIUM CHLORIDE (POUR BTL) OPTIME
TOPICAL | Status: DC | PRN
  Administered 2022-10-27: 1000 mL

## 2022-10-27 MED ORDER — GLYCOPYRROLATE PF 0.2 MG/ML IJ SOSY
PREFILLED_SYRINGE | INTRAMUSCULAR | Status: DC | PRN
Start: 2022-10-27 — End: 2022-10-27
  Administered 2022-10-27: .1 mg via INTRAVENOUS

## 2022-10-27 MED ORDER — OXYCODONE HCL 5 MG/5ML PO SOLN: 5.0000 mg | Freq: Once | ORAL | Status: DC | PRN

## 2022-10-27 MED ORDER — MIDAZOLAM HCL 2 MG/2ML IJ SOLN
INTRAMUSCULAR | Status: DC | PRN
  Administered 2022-10-27: 2 mg via INTRAVENOUS

## 2022-10-27 MED ORDER — OXYCODONE HCL 5 MG PO TABS: 5.0000 mg | ORAL_TABLET | Freq: Once | ORAL | Status: DC | PRN

## 2022-10-27 MED ORDER — LACTATED RINGERS IV SOLN
INTRAVENOUS | Status: DC | PRN
Start: 2022-10-27 — End: 2022-10-27

## 2022-10-27 MED ORDER — PROPOFOL 10 MG/ML IV BOLUS
INTRAVENOUS | Status: DC | PRN
  Administered 2022-10-27: 160 mg via INTRAVENOUS
  Administered 2022-10-27: 100 mg via INTRAVENOUS
  Administered 2022-10-27: 40 mg via INTRAVENOUS

## 2022-10-27 SURGICAL SUPPLY — 41 items
BAG COUNTER SPONGE SURGICOUNT (BAG) IMPLANT
BAG SPNG CNTER NS LX DISP (BAG)
BLADE SURG 21 STRL SS (BLADE) ×2 IMPLANT
BNDG CMPR 5X4 CHSV STRCH STRL (GAUZE/BANDAGES/DRESSINGS) ×2
BNDG CMPR 5X6 CHSV STRCH STRL (GAUZE/BANDAGES/DRESSINGS) ×2
BNDG COHESIVE 4X5 TAN STRL LF (GAUZE/BANDAGES/DRESSINGS) IMPLANT
BNDG COHESIVE 6X5 TAN ST LF (GAUZE/BANDAGES/DRESSINGS) IMPLANT
BNDG GAUZE DERMACEA FLUFF 4 (GAUZE/BANDAGES/DRESSINGS) ×4 IMPLANT
BNDG GZE DERMACEA 4 6PLY (GAUZE/BANDAGES/DRESSINGS) ×14
CANISTER WOUNDNEG PRESSURE 500 (CANNISTER) IMPLANT
COVER SURGICAL LIGHT HANDLE (MISCELLANEOUS) ×4 IMPLANT
DRAPE U-SHAPE 47X51 STRL (DRAPES) ×2 IMPLANT
DRESSING PREVENA PLUS CUSTOM (GAUZE/BANDAGES/DRESSINGS) IMPLANT
DRSG ADAPTIC 3X8 NADH LF (GAUZE/BANDAGES/DRESSINGS) ×2 IMPLANT
DRSG PREVENA PLUS CUSTOM (GAUZE/BANDAGES/DRESSINGS) ×2
DURAPREP 26ML APPLICATOR (WOUND CARE) ×2 IMPLANT
ELECT REM PT RETURN 9FT ADLT (ELECTROSURGICAL)
ELECTRODE REM PT RTRN 9FT ADLT (ELECTROSURGICAL) IMPLANT
GAUZE SPONGE 4X4 12PLY STRL (GAUZE/BANDAGES/DRESSINGS) ×2 IMPLANT
GLOVE BIOGEL PI IND STRL 9 (GLOVE) ×2 IMPLANT
GLOVE SURG ORTHO 9.0 STRL STRW (GLOVE) ×2 IMPLANT
GOWN STRL REUS W/ TWL XL LVL3 (GOWN DISPOSABLE) ×4 IMPLANT
GOWN STRL REUS W/TWL XL LVL3 (GOWN DISPOSABLE) ×4
GRAFT SKIN WND SURGICLOSE M95 (Tissue) IMPLANT
HANDPIECE INTERPULSE COAX TIP (DISPOSABLE)
KIT BASIN OR (CUSTOM PROCEDURE TRAY) ×2 IMPLANT
KIT DRSG PREVENA PLUS 7DAY 125 (MISCELLANEOUS) IMPLANT
KIT TURNOVER KIT B (KITS) ×2 IMPLANT
MANIFOLD NEPTUNE II (INSTRUMENTS) ×2 IMPLANT
NS IRRIG 1000ML POUR BTL (IV SOLUTION) ×2 IMPLANT
PACK ORTHO EXTREMITY (CUSTOM PROCEDURE TRAY) ×2 IMPLANT
PAD ARMBOARD 7.5X6 YLW CONV (MISCELLANEOUS) ×4 IMPLANT
SET HNDPC FAN SPRY TIP SCT (DISPOSABLE) IMPLANT
SPONGE T-LAP 18X18 ~~LOC~~+RFID (SPONGE) IMPLANT
STOCKINETTE IMPERVIOUS 9X36 MD (GAUZE/BANDAGES/DRESSINGS) IMPLANT
SUT ETHILON 2 0 PSLX (SUTURE) ×2 IMPLANT
SWAB COLLECTION DEVICE MRSA (MISCELLANEOUS) ×2 IMPLANT
SWAB CULTURE ESWAB REG 1ML (MISCELLANEOUS) IMPLANT
TOWEL GREEN STERILE (TOWEL DISPOSABLE) ×2 IMPLANT
TUBE CONNECTING 12X1/4 (SUCTIONS) ×2 IMPLANT
YANKAUER SUCT BULB TIP NO VENT (SUCTIONS) ×2 IMPLANT

## 2022-10-27 NOTE — Evaluation (Signed)
Occupational Therapy Evaluation Patient Details Name: Dean Nichols MRN: [id number] DOB: 11/09/1995 Today's Date: 10/27/2022   History of Present Illness 27 yo male admitted 10/2 with RLE pain, edema with sepsis and cellulits after cutting leg on metal 9/30. Pt with necrotizing fasciitis s/p debridement of Rt calf and thigh 10/6 & 10/9. PMhx; Rt ACL tear   Clinical Impression   Pt was independent and working prior to admission. Plans to discharge to his mother's home when medically ready. Another I&D is planned for this afternoon, pt is hopeful for wound closure. Pt presents with R LE pain. Able to complete bed mobility with supervision for lines. Began educating pt in compensatory strategies for LB ADLs. Did not progress OOB due to pain. Encouraged use of IS. Do not anticipate pt will need post acute OT.       If plan is discharge home, recommend the following: A little help with walking and/or transfers;A lot of help with bathing/dressing/bathroom;Assistance with cooking/housework;Assist for transportation;Help with stairs or ramp for entrance    Functional Status Assessment  Patient has had a recent decline in their functional status and demonstrates the ability to make significant improvements in function in a reasonable and predictable amount of time.  Equipment Recommendations  Other (comment) (TBD)    Recommendations for Other Services       Precautions / Restrictions Precautions Precautions: Fall;Other (comment) Precaution Comments: R LE wound vac Restrictions Weight Bearing Restrictions: No      Mobility Bed Mobility Overal bed mobility: Needs Assistance Bed Mobility: Supine to Sit, Sit to Supine     Supine to sit: Supervision Sit to supine: Supervision   General bed mobility comments: no physical assist    Transfers                   General transfer comment: did not attempt      Balance                                            ADL either performed or assessed with clinical judgement   ADL Overall ADL's : Needs assistance/impaired Eating/Feeding: Independent;Bed level   Grooming: Set up;Sitting   Upper Body Bathing: Sitting;Minimal assistance   Lower Body Bathing: Maximal assistance;Sitting/lateral leans   Upper Body Dressing : Minimal assistance;Sitting   Lower Body Dressing: Moderate assistance;Sit to/from stand                       Vision Baseline Vision/History: 0 No visual deficits       Perception         Praxis         Pertinent Vitals/Pain Pain Assessment Pain Assessment: Faces Faces Pain Scale: Hurts even more Pain Location: R LE Pain Descriptors / Indicators: Aching, Guarding Pain Intervention(s): Monitored during session, Repositioned     Extremity/Trunk Assessment Upper Extremity Assessment Upper Extremity Assessment: Overall WFL for tasks assessed   Lower Extremity Assessment Lower Extremity Assessment: Defer to PT evaluation   Cervical / Trunk Assessment Cervical / Trunk Assessment: Normal   Communication Communication Communication: No apparent difficulties   Cognition Arousal: Alert Behavior During Therapy: WFL for tasks assessed/performed Overall Cognitive Status: Within Functional Limits for tasks assessed  General Comments       Exercises     Shoulder Instructions      Home Living Family/patient expects to be discharged to:: Private residence Living Arrangements: Parent Available Help at Discharge: Family;Available PRN/intermittently Type of Home: House Home Access: Stairs to enter Entrance Stairs-Number of Steps: 3-5 Entrance Stairs-Rails: Right Home Layout: Two level;1/2 bath on main level;Able to live on main level with bedroom/bathroom Alternate Level Stairs-Number of Steps: 14   Bathroom Shower/Tub: Chief Strategy Officer: Standard     Home Equipment: Crutches    Additional Comments: pt normally lives in 3rd floor apartment with dad who is a Naval architect, plans to stay with mom in 2 story house with couch downstairs and siblings able to assist      Prior Functioning/Environment Prior Level of Function : Independent/Modified Independent;Working/employed;Driving             Mobility Comments: Independent, working for [employer]          OT Problem List: Impaired balance (sitting and/or standing);Pain      OT Treatment/Interventions: Self-care/ADL training;DME and/or AE instruction;Balance training;Patient/family education    OT Goals(Current goals can be found in the care plan section) Acute Rehab OT Goals OT Goal Formulation: With patient Time For Goal Achievement: 11/10/22 Potential to Achieve Goals: Good ADL Goals Pt Will Perform Grooming: with modified independence;standing Pt Will Perform Lower Body Bathing: with modified independence;sit to/from stand Pt Will Perform Lower Body Dressing: with modified independence;sit to/from stand Pt Will Transfer to Toilet: with modified independence;ambulating;regular height toilet Pt Will Perform Toileting - Clothing Manipulation and hygiene: with modified independence;sit to/from stand;sitting/lateral leans Pt Will Perform Tub/Shower Transfer: Tub transfer;with modified independence;ambulating;shower seat  OT Frequency: Min 1X/week    Co-evaluation              AM-PAC OT "6 Clicks" Daily Activity     Outcome Measure Help from another person eating meals?: None Help from another person taking care of personal grooming?: A Little Help from another person toileting, which includes using toliet, bedpan, or urinal?: A Little   Help from another person to put on and taking off regular upper body clothing?: A Little Help from another person to put on and taking off regular lower body clothing?: A Little 6 Click Score: 16   End of Session    Activity Tolerance: Patient limited by pain;Patient  limited by fatigue Patient left: in bed;with call bell/phone within reach;with family/visitor present  OT Visit Diagnosis: Pain Pain - Right/Left: Right Pain - part of body: Leg                Time: 1610-9604 OT Time Calculation (min): 13 min Charges:  OT General Charges $OT Visit: 1 Visit OT Evaluation $OT Eval Moderate Complexity: 1 Mod  Berna Spare, OTR/L Acute Rehabilitation Services Office: [phone]   Evern Bio 10/27/2022, 1:03 PM

## 2022-10-27 NOTE — Progress Notes (Signed)
PROGRESS NOTE    Dean Nichols  PIR:[id number] DOB: Apr 15, 1995 DOA: 10/18/2022 PCP: Pcp, No   Brief Narrative:  This 27 yrs old male with medical history significant of previous history of ACL tear of the right knee joint, otherwise no significant medical history who was working apparently in Aeronautical engineer.  He was working in the yard when he accidentally cut his leg around the lateral aspect of the right knee with a metal pipe.  He presented to the ED from urgent care with fevers, chills, nausea, vomiting -> sepsis -> was admitted for sepsis due to right lower extremity cellulitis. He is found to have necrotizing fasciitis.  ID and ortho following.  Plan for OR 10/9 and 10/11 (s/p R leg debridement on 10/6).   Assessment & Plan:   Principal Problem:   Sepsis due to cellulitis [facility]) Active Problems:   AKI (acute kidney injury) (HCC)   Hyponatremia   Necrotizing fasciitis due to Streptococcus pyogenes (HCC)   Sepsis due to group A Streptococcus with acute organ dysfunction and septic shock (HCC)  Sepsis  / Streptococcus pyogenes bacteremia: Necrotizing fasciitis of right lower extremity Met criteria for sepsis on presentation, continues to meet criteria for sepsis with tachycardia, fever, leukocytosis. He spiked fever on 10/5, leukocytosis is persistent. Blood cultures showed Strep pyogenes in 1/2 bottles. Repeat cultures from 10/4 Ng x 2 Appreciate ortho recs - s/p R leg excisional debridement, necrotizing fasciitis of thigh and calf  Status post repeat debridement for necrotizing fasciitis.  POD 2 Tissue margins were clear and anticipate closure of wounds today. Surgical culture 10/6 Neg x 2 days  Penicillin and linezolid (per ID, likely to d/c zyvox after surgery ) ID c/s -> appreciate recs. Echocardiogram EF 55-60%, no RWMA S/p Tdap 10/2 in ED.   Left Upper Lip Ulceration: Possibly related to surgery. Continue to monitor.   Elevated LFT's: ? Shock liver RUQ [place] without  concerning findings of liver and gallbladder. Acute hepatitis panel  negative Fluctuating, Continue to monitor   Acute kidney Injury: > Resolved. Baseline creatinine unknown Suspect due to sepsis Follow renal US (normal), UA unrevealing   Hypokalemia: Replaced and resolved.   Leukocytosis: Likely secondary to above. Continue antibiotics.  DVT prophylaxis: Lovenox Code Status: Full code Family Communication: Spoke with mother on the phone. Disposition Plan: Status is: Inpatient Remains inpatient appropriate because: Admitted for necrotizing fasciitis is scheduled for OR today.    Consultants:  Infectious diseases Orthopedics  Procedures: Antimicrobials: Anti-infectives (From admission, onward)    Start     Dose/Rate Route Frequency Ordered Stop   10/27/22 0630  ceFAZolin (ANCEF) IVPB 2g/100 mL premix        2 g 200 mL/hr over 30 Minutes Intravenous On call to O.R. 10/27/22 0539 10/28/22 0559   10/25/22 1550  vancomycin (VANCOCIN) powder  Status:  Discontinued          As needed 10/25/22 1551 10/25/22 1612   10/25/22 0600  ceFAZolin (ANCEF) IVPB 2g/100 mL premix        2 g 200 mL/hr over 30 Minutes Intravenous On call to O.R. 10/24/22 1617 10/25/22 1527   10/20/22 2200  linezolid (ZYVOX) tablet 600 mg        600 mg Oral Every 12 hours 10/20/22 1018 10/27/22 2359   10/19/22 1300  penicillin G potassium 12 Million Units in dextrose 5 % 500 mL CONTINUOUS infusion        12 Million Units 41.7 mL/hr over 12 Hours Intravenous Every  12 hours 10/19/22 1213     10/19/22 1300  linezolid (ZYVOX) IVPB 600 mg  Status:  Discontinued        600 mg 300 mL/hr over 60 Minutes Intravenous Every 12 hours 10/19/22 1213 10/20/22 1018   10/19/22 0600  vancomycin (VANCOCIN) IVPB 1000 mg/200 mL premix  Status:  Discontinued        1,000 mg 200 mL/hr over 60 Minutes Intravenous Every 12 hours 10/18/22 1734 10/19/22 1213   10/19/22 0000  ceFEPIme (MAXIPIME) 2 g in sodium chloride 0.9 % 100 mL  IVPB  Status:  Discontinued        2 g 200 mL/hr over 30 Minutes Intravenous Every 8 hours 10/18/22 1734 10/19/22 1213   10/18/22 2130  vancomycin (VANCOCIN) IVPB 1000 mg/200 mL premix  Status:  Discontinued        1,000 mg 200 mL/hr over 60 Minutes Intravenous  Once 10/18/22 2033 10/18/22 2047   10/18/22 1630  ceFEPIme (MAXIPIME) 2 g in sodium chloride 0.9 % 100 mL IVPB        2 g 200 mL/hr over 30 Minutes Intravenous  Once 10/18/22 1616 10/18/22 1820   10/18/22 1630  metroNIDAZOLE (FLAGYL) IVPB 500 mg        500 mg 100 mL/hr over 60 Minutes Intravenous  Once 10/18/22 1616 10/18/22 1819   10/18/22 1630  vancomycin (VANCOCIN) IVPB 1000 mg/200 mL premix        1,000 mg 200 mL/hr over 60 Minutes Intravenous  Once 10/18/22 1616 10/18/22 1924      Subjective: Patient was seen and examined at bedside. Overnight events noted.   Patient reports doing better, Pain is reasonably controlled. RLE appears swollen,  covered in dressing.  Wound VAC still shows bloody fluid. He is status post debridement for the necrotizing fasciitis.   Objective: Vitals:   10/26/22 1252 10/26/22 1634 10/26/22 1943 10/27/22 0739  BP: 126/76 126/73 120/71 125/69  Pulse: 78 85 69 76  Resp: 18 18  18   Temp:  (!) 97.5 F (36.4 C) 98.4 F (36.9 C) 98.3 F (36.8 C)  TempSrc:  Oral Oral Oral  SpO2: 99% 99% 98% 99%  Weight:      Height:        Intake/Output Summary (Last 24 hours) at 10/27/2022 1242 Last data filed at 10/27/2022 0059 Gross per 24 hour  Intake 3356.61 ml  Output 1050 ml  Net 2306.61 ml   Filed Weights   10/18/22 1518 10/19/22 0350  Weight: 74.8 kg 75.1 kg    Examination:  General exam: Appears calm and comfortable , not in any acute distress. Respiratory system: CTA bilaterally. Respiratory effort normal.  RR 15 Cardiovascular system: S1 & S2 heard, RRR. No JVD, murmurs, rubs, gallops or clicks.  Gastrointestinal system: Abdomen is nondistended, soft and nontender. Normal bowel sounds  heard. Central nervous system: Alert and oriented x 3. No focal neurological deficits. Extremities: RLE swollen, tender, covered in dressing, blood noted in wound VAC. Skin: No rashes, lesions or ulcers Psychiatry: Judgement and insight appear normal. Mood & affect appropriate.     Data Reviewed: I have personally reviewed following labs and imaging studies  CBC: Recent Labs  Lab 10/21/22 0551 10/22/22 0639 10/23/22 1008 10/24/22 0522 10/25/22 0531 10/25/22 2158 10/26/22 0805 10/27/22 0751  WBC 21.6* 17.2* 18.9* 13.6* 18.7* 29.4* 17.6* 15.4*  NEUTROABS 17.6* 12.2* 14.7*  --   --   --   --   --   HGB 11.6* 11.8* 12.8*  11.4* 12.7* 12.1* 11.0* 10.7*  HCT 31.8* 32.4* 35.9* 32.2* 36.4* 34.9* 31.6* 30.6*  MCV 84.8 84.6 85.7 87.0 86.7 87.7 88.3 89.5  PLT 206 231 298 296 388 447* 447* 465*   Basic Metabolic Panel: Recent Labs  Lab 10/21/22 0551 10/22/22 0639 10/23/22 1008 10/24/22 0522 10/25/22 0531 10/26/22 0805 10/27/22 0751  NA 132* 135 136 138  --  135 135  K 3.3* 3.4* 4.3 4.0  --  4.3 4.3  CL 100 101 100 102  --  98 98  CO2 24 25 26 27   --  27 27  GLUCOSE 100* 100* 120* 94  --  105* 106*  BUN 12 10 12 15   --  11 18  CREATININE 1.23 1.11 1.02 1.01 0.97 1.02 1.04  CALCIUM 7.6* 7.6* 8.1* 8.0*  --  8.8* 9.0  MG 1.8 1.9 2.3 2.0  --   --  2.1  PHOS 2.7 3.4 3.2 3.6  --   --  3.2   GFR: Estimated Creatinine Clearance: 112.3 mL/min (by C-G formula based on SCr of 1.04 mg/dL). Liver Function Tests: Recent Labs  Lab 10/21/22 0551 10/22/22 0639 10/23/22 1008 10/24/22 0522 10/26/22 0805  AST 157* 135* 67* 84* 58*  ALT 184* 182* 162* 152* 131*  ALKPHOS 105 183* 191* 165* 190*  BILITOT 1.2 0.9 0.5 0.4 0.6  PROT 5.1* 5.3* 5.8* 5.3* 6.7  ALBUMIN 2.0* 2.0* 2.1* 2.1* 2.7*   No results for input(s): "LIPASE", "AMYLASE" in the last 168 hours. No results for input(s): "AMMONIA" in the last 168 hours. Coagulation Profile: No results for input(s): "INR", "PROTIME" in the  last 168 hours.  Cardiac Enzymes: No results for input(s): "CKTOTAL", "CKMB", "CKMBINDEX", "TROPONINI" in the last 168 hours. BNP (last 3 results) No results for input(s): "PROBNP" in the last 8760 hours. HbA1C: No results for input(s): "HGBA1C" in the last 72 hours. CBG: No results for input(s): "GLUCAP" in the last 168 hours. Lipid Profile: No results for input(s): "CHOL", "HDL", "LDLCALC", "TRIG", "CHOLHDL", "LDLDIRECT" in the last 72 hours. Thyroid Function Tests: No results for input(s): "TSH", "T4TOTAL", "FREET4", "T3FREE", "THYROIDAB" in the last 72 hours. Anemia Panel: No results for input(s): "VITAMINB12", "FOLATE", "FERRITIN", "TIBC", "IRON", "RETICCTPCT" in the last 72 hours. Sepsis Labs: Recent Labs  Lab 10/20/22 1453  LATICACIDVEN 1.2    Recent Results (from the past 240 hour(s))  Culture, blood (Routine x 2)     Status: Abnormal   Collection Time: 10/18/22  3:26 PM   Specimen: BLOOD LEFT ARM  Result Value Ref Range Status   Specimen Description   Final    BLOOD LEFT ARM Performed at [facility], [address], [place], [place] [address]    Special Requests   Final    BOTTLES DRAWN AEROBIC AND ANAEROBIC Blood Culture adequate volume Performed at [facility], [address], [place], [place] [address]    Culture  Setup Time   Final    GRAM POSITIVE COCCI IN CHAINS ANAEROBIC BOTTLE ONLY CRITICAL RESULT CALLED TO, READ BACK BY AND VERIFIED WITH: PHARMD BAILEY A 1153 FCP    Culture (A)  Final    STREPTOCOCCUS PYOGENES HEALTH DEPARTMENT NOTIFIED Performed at [facility], [address] [place], [place] [address]    Report Status 10/21/2022 FINAL  Final   Organism ID, Bacteria STREPTOCOCCUS PYOGENES  Final      Susceptibility   Streptococcus pyogenes - MIC*    PENICILLIN <=0.06 SENSITIVE Sensitive  CEFTRIAXONE <=0.12 SENSITIVE Sensitive     ERYTHROMYCIN <=0.12 SENSITIVE Sensitive     LEVOFLOXACIN 0.5 SENSITIVE  Sensitive     VANCOMYCIN 0.25 SENSITIVE Sensitive     * STREPTOCOCCUS PYOGENES  Blood Culture ID Panel (Reflexed)     Status: Abnormal   Collection Time: 10/18/22  3:26 PM  Result Value Ref Range Status   Enterococcus faecalis NOT DETECTED NOT DETECTED Final   Enterococcus Faecium NOT DETECTED NOT DETECTED Final   Listeria monocytogenes NOT DETECTED NOT DETECTED Final   Staphylococcus species NOT DETECTED NOT DETECTED Final   Staphylococcus aureus (BCID) NOT DETECTED NOT DETECTED Final   Staphylococcus epidermidis NOT DETECTED NOT DETECTED Final   Staphylococcus lugdunensis NOT DETECTED NOT DETECTED Final   Streptococcus species DETECTED (A) NOT DETECTED Final    Comment: CRITICAL RESULT CALLED TO, READ BACK BY AND VERIFIED WITH: PHARMD BAILEY A 1153 FCP    Streptococcus agalactiae NOT DETECTED NOT DETECTED Final   Streptococcus pneumoniae NOT DETECTED NOT DETECTED Final   Streptococcus pyogenes DETECTED (A) NOT DETECTED Final    Comment: CRITICAL RESULT CALLED TO, READ BACK BY AND VERIFIED WITH: PHARMD BAILEY A 1153 FCP    A.calcoaceticus-baumannii NOT DETECTED NOT DETECTED Final   Bacteroides fragilis NOT DETECTED NOT DETECTED Final   Enterobacterales NOT DETECTED NOT DETECTED Final   Enterobacter cloacae complex NOT DETECTED NOT DETECTED Final   Escherichia coli NOT DETECTED NOT DETECTED Final   Klebsiella aerogenes NOT DETECTED NOT DETECTED Final   Klebsiella oxytoca NOT DETECTED NOT DETECTED Final   Klebsiella pneumoniae NOT DETECTED NOT DETECTED Final   Proteus species NOT DETECTED NOT DETECTED Final   Salmonella species NOT DETECTED NOT DETECTED Final   Serratia marcescens NOT DETECTED NOT DETECTED Final   Haemophilus influenzae NOT DETECTED NOT DETECTED Final   Neisseria meningitidis NOT DETECTED NOT DETECTED Final   Pseudomonas aeruginosa NOT DETECTED NOT DETECTED Final   Stenotrophomonas maltophilia NOT DETECTED NOT DETECTED Final   Candida albicans NOT DETECTED NOT  DETECTED Final   Candida auris NOT DETECTED NOT DETECTED Final   Candida glabrata NOT DETECTED NOT DETECTED Final   Candida krusei NOT DETECTED NOT DETECTED Final   Candida parapsilosis NOT DETECTED NOT DETECTED Final   Candida tropicalis NOT DETECTED NOT DETECTED Final   Cryptococcus neoformans/gattii NOT DETECTED NOT DETECTED Final    Comment: Performed at [facility], [address], [place], [place] [address]  Culture, blood (Routine x 2)     Status: None   Collection Time: 10/18/22  3:31 PM   Specimen: BLOOD  Result Value Ref Range Status   Specimen Description   Final    BLOOD LEFT ANTECUBITAL Performed at [facility], [address] [place], [place] [address]    Special Requests   Final    BOTTLES DRAWN AEROBIC AND ANAEROBIC Blood Culture adequate volume Performed at [facility], [address], [place], [place] [address]    Culture   Final    NO GROWTH 5 DAYS Performed at [facility], [address] [place], [place] [address]    Report Status 10/23/2022 FINAL  Final  Culture, blood (Routine X 2) w Reflex to ID Panel     Status: None   Collection Time: 10/20/22  5:52 AM   Specimen: BLOOD  Result Value Ref Range Status   Specimen Description BLOOD BLOOD RIGHT ARM  Final   Special Requests   Final    BOTTLES DRAWN AEROBIC AND ANAEROBIC  Blood Culture adequate volume   Culture   Final    NO GROWTH 5 DAYS Performed at [facility], [address] [place], [place] [address]    Report Status 10/25/2022 FINAL  Final  Culture, blood (Routine X 2) w Reflex to ID Panel     Status: None   Collection Time: 10/20/22  5:57 AM   Specimen: BLOOD  Result Value Ref Range Status   Specimen Description BLOOD BLOOD RIGHT ARM  Final   Special Requests   Final    BOTTLES DRAWN AEROBIC AND ANAEROBIC Blood Culture adequate volume   Culture   Final    NO GROWTH 5 DAYS Performed at [facility], [address]  [place], [place] [address]    Report Status 10/25/2022 FINAL  Final  Aerobic/Anaerobic Culture w Gram Stain (surgical/deep wound)     Status: None (Preliminary result)   Collection Time: 10/22/22  2:51 PM   Specimen: Soft Tissue, Other  Result Value Ref Range Status   Specimen Description TISSUE  Final   Special Requests right gastroc fascia  Final   Gram Stain NO WBC SEEN NO ORGANISMS SEEN   Final   Culture   Final    NO GROWTH 4 DAYS NO ANAEROBES ISOLATED; CULTURE IN PROGRESS FOR 5 DAYS Performed at [facility], [address], [place], [place] [address]    Report Status PENDING  Incomplete  Surgical pcr screen     Status: None   Collection Time: 10/27/22  6:22 AM   Specimen: Nasal Mucosa; Nasal Swab  Result Value Ref Range Status   MRSA, PCR NEGATIVE NEGATIVE Final   Staphylococcus aureus NEGATIVE NEGATIVE Final    Comment: (NOTE) The Xpert SA Assay (FDA approved for NASAL specimens in patients 25 years of age and older), is one component of a comprehensive surveillance program. It is not intended to diagnose infection nor to guide or monitor treatment. Performed at [facility], [address], [place], [place] [address]     Radiology Studies: No results found.  Scheduled Meds:  acetaminophen  1,000 mg Oral Q8H   vitamin C  1,000 mg Oral Daily   docusate sodium  100 mg Oral BID   linezolid  600 mg Oral Q12H   mupirocin ointment  1 Application Nasal BID   nutrition supplement (JUVEN)  1 packet Oral BID BM   pantoprazole  40 mg Oral Daily   zinc sulfate  220 mg Oral Daily   Continuous Infusions:  sodium chloride      ceFAZolin (ANCEF) IV     magnesium sulfate bolus IVPB     methocarbamol (ROBAXIN) IV     penicillin G potassium 12 Million Units in dextrose 5 % 500 mL CONTINUOUS infusion 12 Million Units (10/27/22 0937)     LOS: 9 days    Time spent: 35 mins    Willeen Niece, MD [facility]   If 7PM-7AM, please contact night-coverage

## 2022-10-27 NOTE — Interval H&P Note (Signed)
History and Physical Interval Note:  10/27/2022 6:46 AM  Dean Nichols  has presented today for surgery, with the diagnosis of Necrotizing Fasciitis Right Leg.  The various methods of treatment have been discussed with the patient and family. After consideration of risks, benefits and other options for treatment, the patient has consented to  Procedure(s): REPEAT DEBRIDEMENT RIGHT LEG/THIGH/FOOT (Right) as a surgical intervention.  The patient's history has been reviewed, patient examined, no change in status, stable for surgery.  I have reviewed the patient's chart and labs.  Questions were answered to the patient's satisfaction.     [name]

## 2022-10-27 NOTE — Anesthesia Preprocedure Evaluation (Addendum)
Anesthesia Evaluation  Patient identified by MRN, date of birth, ID band Patient awake    Reviewed: Allergy & Precautions, NPO status , Patient's Chart, lab work & pertinent test results, reviewed documented beta blocker date and time   History of Anesthesia Complications Negative for: history of anesthetic complications  Airway Mallampati: II  TM Distance: >3 FB Neck ROM: Full    Dental  (+) Teeth Intact, Dental Advisory Given   Pulmonary neg sleep apnea, neg COPD, neg recent URI, neg PE   breath sounds clear to auscultation       Cardiovascular (-) hypertension(-) angina (-) CAD and (-) Past MI  Rhythm:Regular  ECHO  1. Left ventricular ejection fraction, by estimation, is 55 to 60%. The  left ventricle has normal function. The left ventricle has no regional  wall motion abnormalities. Left ventricular diastolic parameters were  normal.   2. Right ventricular systolic function is normal. The right ventricular  size is normal. There is normal pulmonary artery systolic pressure. The  estimated right ventricular systolic pressure is 25.1 mmHg.   3. The mitral valve is normal in structure. No evidence of mitral valve  regurgitation. No evidence of mitral stenosis.   4. The aortic valve is tricuspid. Aortic valve regurgitation is not  visualized. No aortic stenosis is present.   5. The inferior vena cava is dilated in size with >50% respiratory  variability, suggesting right atrial pressure of 8 mmHg.   6. No valvular vegetation noted.      Neuro/Psych neg Seizures    GI/Hepatic   Endo/Other    Renal/GU ARFRenal disease     Musculoskeletal Necrotizing fasciitis    Abdominal   Peds  Hematology Prior sepsis   Anesthesia Other Findings   Reproductive/Obstetrics                             Anesthesia Physical Anesthesia Plan  ASA: 3  Anesthesia Plan: General   Post-op Pain Management:     Induction: Intravenous  PONV Risk Score and Plan: 2 and Ondansetron  Airway Management Planned: Oral ETT  Additional Equipment: None  Intra-op Plan:   Post-operative Plan: Extubation in OR  Informed Consent: I have reviewed the patients History and Physical, chart, labs and discussed the procedure including the risks, benefits and alternatives for the proposed anesthesia with the patient or authorized representative who has indicated his/her understanding and acceptance.     Dental advisory given  Plan Discussed with: CRNA and Anesthesiologist  Anesthesia Plan Comments:         Anesthesia Quick Evaluation

## 2022-10-27 NOTE — Anesthesia Procedure Notes (Addendum)
Procedure Name: LMA Insertion Date/Time: 10/27/2022 3:06 PM  Performed by: Sharyn Dross, CRNAPre-anesthesia Checklist: Patient identified, Emergency Drugs available, Suction available and Patient being monitored Patient Re-evaluated:Patient Re-evaluated prior to induction Oxygen Delivery Method: Circle system utilized Preoxygenation: Pre-oxygenation with 100% oxygen Induction Type: IV induction Ventilation: Mask ventilation without difficulty LMA: LMA inserted LMA Size: 5.0 Number of attempts: 1 Placement Confirmation: positive ETCO2 and breath sounds checked- equal and bilateral Tube secured with: Tape Dental Injury: Teeth and Oropharynx as per pre-operative assessment

## 2022-10-27 NOTE — Transfer of Care (Signed)
Immediate Anesthesia Transfer of Care Note  Patient: Dean Nichols  Procedure(s) Performed: RIGHT LEG, THIGH DEBRIDMENT AND FASCIOTOMY CLOSURE (Right: Leg Lower) APPLICATION OF KERECIS MICRO 95 SQ CM (Leg Lower) APPLICATION OF WOUND VAC (Right: Leg Lower)  Patient Location: PACU  Anesthesia Type:General  Level of Consciousness: awake, alert , and oriented  Airway & Oxygen Therapy: Patient connected to face mask oxygen  Post-op Assessment: Report given to RN and Post -op Vital signs reviewed and stable  Post vital signs: Reviewed and stable  Last Vitals:  Vitals Value Taken Time  BP 125/78 10/27/22 1605  Temp    Pulse 92 10/27/22 1607  Resp 11 10/27/22 1607  SpO2 100 % 10/27/22 1607  Vitals shown include unfiled device data.  Last Pain:  Vitals:   10/27/22 1422  TempSrc: Oral  PainSc: 3       Patients Stated Pain Goal: 4 (10/26/22 0119)  Complications: There were no known notable events for this encounter.

## 2022-10-27 NOTE — Plan of Care (Signed)
  Problem: Education: Goal: Knowledge of General Education information will improve Description: Including pain rating scale, medication(s)/side effects and non-pharmacologic comfort measures Outcome: Progressing   Problem: Health Behavior/Discharge Planning: Goal: Ability to manage health-related needs will improve Outcome: Progressing   Problem: Clinical Measurements: Goal: Ability to maintain clinical measurements within normal limits will improve Outcome: Progressing Goal: Will remain free from infection Outcome: Progressing Goal: Diagnostic test results will improve Outcome: Progressing Goal: Respiratory complications will improve Outcome: Progressing Goal: Cardiovascular complication will be avoided Outcome: Progressing   Problem: Activity: Goal: Risk for activity intolerance will decrease Outcome: Progressing   Problem: Nutrition: Goal: Adequate nutrition will be maintained Outcome: Progressing   Problem: Coping: Goal: Level of anxiety will decrease Outcome: Progressing   Problem: Elimination: Goal: Will not experience complications related to bowel motility Outcome: Progressing Goal: Will not experience complications related to urinary retention Outcome: Progressing   Problem: Pain Managment: Goal: General experience of comfort will improve Outcome: Progressing   Problem: Safety: Goal: Ability to remain free from injury will improve Outcome: Progressing   Problem: Skin Integrity: Goal: Risk for impaired skin integrity will decrease Outcome: Progressing   Problem: Fluid Volume: Goal: Hemodynamic stability will improve Outcome: Progressing   Problem: Clinical Measurements: Goal: Diagnostic test results will improve Outcome: Progressing Goal: Signs and symptoms of infection will decrease Outcome: Progressing   Problem: Respiratory: Goal: Ability to maintain adequate ventilation will improve Outcome: Progressing   Problem: Clinical  Measurements: Goal: Ability to avoid or minimize complications of infection will improve Outcome: Progressing   Problem: Skin Integrity: Goal: Skin integrity will improve Outcome: Progressing   Problem: Clinical Measurements: Goal: Ability to avoid or minimize complications of infection will improve Outcome: Progressing   Problem: Skin Integrity: Goal: Skin integrity will improve Outcome: Progressing   Problem: Education: Goal: Knowledge of the prescribed therapeutic regimen will improve Outcome: Progressing Goal: Ability to verbalize activity precautions or restrictions will improve Outcome: Progressing Goal: Understanding of discharge needs will improve Outcome: Progressing   Problem: Activity: Goal: Ability to perform//tolerate increased activity and mobilize with assistive devices will improve Outcome: Progressing   Problem: Clinical Measurements: Goal: Postoperative complications will be avoided or minimized Outcome: Progressing   Problem: Self-Care: Goal: Ability to meet self-care needs will improve Outcome: Progressing   Problem: Self-Concept: Goal: Ability to maintain and perform role responsibilities to the fullest extent possible will improve Outcome: Progressing   Problem: Pain Management: Goal: Pain level will decrease with appropriate interventions Outcome: Progressing   Problem: Skin Integrity: Goal: Demonstration of wound healing without infection will improve Outcome: Progressing

## 2022-10-27 NOTE — Op Note (Signed)
10/27/2022  4:09 PM  PATIENT:  Dean Nichols    PRE-OPERATIVE DIAGNOSIS:  Necrotizing Fasciitis Right Leg  POST-OPERATIVE DIAGNOSIS:  Same  PROCEDURE:  RIGHT LEG,  Excisional debridement right thigh Excisional debridement right calf. Excisional debridement right knee through separate incision. Local tissue rearrangement for wound closure 20 x 5 cm right calf. Local tissue rearrangement for thigh closure 30 x 5 cm. APPLICATION OF KERECIS MICRO 95 SQ CM x 2, APPLICATION OF WOUND VAC with 3 separate customizable wound VAC sponges.  Cultures from the new knee wound sent for micro  SURGEON:  [name], MD  PHYSICIAN ASSISTANT:None ANESTHESIA:   General  PREOPERATIVE INDICATIONS:  Dean Nichols is a  27 y.o. male with a diagnosis of Necrotizing Fasciitis Right Leg who failed conservative measures and elected for surgical management.    The risks benefits and alternatives were discussed with the patient preoperatively including but not limited to the risks of infection, bleeding, nerve injury, cardiopulmonary complications, the need for revision surgery, among others, and the patient was willing to proceed.  OPERATIVE IMPLANTS:   Implant Name Type Inv. Item Serial No. Manufacturer Lot No. LRB No. Used Action  GRAFT SKIN WND SURGICLOSE M95 - [id number] Tissue GRAFT SKIN WND [employer]  [employer] [id number] Right 1 Implanted  GRAFT SKIN WND SURGICLOSE M95 - [id number] Tissue GRAFT SKIN WND SURGICLOSE M95  [employer] [id number] Right 1 Implanted    @ENCIMAGES @  OPERATIVE FINDINGS: Patient had a new abscess extra-articular laterally to the right knee was debrided this did communicate to the thigh wound.  Tissue was sent for cultures.  OPERATIVE PROCEDURE: Patient brought the operating room and underwent a general anesthetic.  After adequate levels anesthesia obtained patient's right lower extremity was prepped using DuraPrep draped into a sterile field a timeout was  called.  Attention was first focused on the lateral thigh wound.  21 blade knife and rondure were used to excise skin soft tissue muscle and fascia to back to healthy viable tissue.  Patient had a new ulcer extra-articular over the lateral aspect of the patella.  This was incised a rondure was used to debride necrotic tissue and the tissue was sent for cultures.  This communicated with the thigh wound as well.  Both wounds were irrigated with Vashe and then 95 cm of Kerecis micro graft was used to reinforce the tissue local tissue rearrangement was used to close the thigh wound that was 30 x 5 cm.  The knee incision was also closed that was 5 cm in length.  Attention was then focused on the medial right calf.  A rondure and 21 blade knife were used to excise skin soft tissue muscle and fascia.  This was irrigated with Vashe.  95 cm of Kerecis was applied and local tissue rearrangement was used to close the wound 20 x 5 cm.  A customizable wound VAC was then used to incorporate all 3 wounds with 3 separate sponges.  This was covered with derma tack and Coban.  This had a good suction fit patient was extubated taken the PACU in stable condition.   DISCHARGE PLANNING:  Antibiotic duration: Continue antibiotics per infectious disease  Weightbearing: Weightbearing as tolerated plan  Pain medication: Opioid pathway  Dressing care/ Wound VAC: Wound VAC for 1 week  Ambulatory devices: Walker or crutches  Discharge to: Discharge planning based on therapy recommendations.  Follow-up: In the office 1 week post operative.

## 2022-10-27 NOTE — Plan of Care (Signed)

## 2022-10-28 DIAGNOSIS — L0390 Cellulitis, unspecified: Secondary | ICD-10-CM | POA: Diagnosis not present

## 2022-10-28 DIAGNOSIS — A419 Sepsis, unspecified organism: Secondary | ICD-10-CM | POA: Diagnosis not present

## 2022-10-28 LAB — CBC
HCT: 28 % — ABNORMAL LOW (ref 39.0–52.0)
Hemoglobin: 9.8 g/dL — ABNORMAL LOW (ref 13.0–17.0)
MCH: 31.3 pg (ref 26.0–34.0)
MCHC: 35 g/dL (ref 30.0–36.0)
MCV: 89.5 fL (ref 80.0–100.0)
Platelets: 514 10*3/uL — ABNORMAL HIGH (ref 150–400)
RBC: 3.13 MIL/uL — ABNORMAL LOW (ref 4.22–5.81)
RDW: 13.2 % (ref 11.5–15.5)
WBC: 18.4 10*3/uL — ABNORMAL HIGH (ref 4.0–10.5)
nRBC: 0 % (ref 0.0–0.2)

## 2022-10-28 MED ORDER — ENOXAPARIN SODIUM 40 MG/0.4ML IJ SOSY
40.0000 mg | PREFILLED_SYRINGE | INTRAMUSCULAR | Status: DC
  Filled 2022-10-28: qty 0.4

## 2022-10-28 NOTE — Progress Notes (Signed)
PROGRESS NOTE    Dean CHAVARRIA  XLK:[id number] DOB: Nov 28, 1995 DOA: 10/18/2022 PCP: Pcp, No   Brief Narrative:  This 27 yrs old male with medical history significant of previous history of ACL tear of the right knee joint, otherwise no significant medical history who was working apparently in Aeronautical engineer.  He was working in the yard when he accidentally cut his leg around the lateral aspect of the right knee with a metal pipe.  He presented to the ED from urgent care with fevers, chills, nausea, vomiting -> sepsis -> was admitted for sepsis due to right lower extremity cellulitis. He is found to have necrotizing fasciitis.  ID and ortho following.  Plan for OR 10/9 and 10/11 (s/p R leg debridement on 10/6).   Assessment & Plan:   Principal Problem:   Sepsis due to cellulitis [facility]) Active Problems:   AKI (acute kidney injury) (HCC)   Hyponatremia   Necrotizing fasciitis due to Streptococcus pyogenes (HCC)   Sepsis due to group A Streptococcus with acute organ dysfunction and septic shock (HCC)  Sepsis  / Streptococcus pyogenes bacteremia: Necrotizing fasciitis of right lower extremity Met criteria for sepsis on presentation, continues to meet criteria for sepsis with tachycardia, fever, leukocytosis. He spiked fever on 10/5, leukocytosis is persistent. Blood cultures showed Strep pyogenes in 1/2 bottles. Repeat cultures from 10/4 Ng x 2 Appreciate ortho recs - s/p R leg excisional debridement, necrotizing fasciitis of thigh and calf  Status post repeat debridement for necrotizing fasciitis.  POD 3 Tissue margins were clear, status post repeat debridement necrotizing fasciitis of right leg. Surgical culture 10/6 Neg x 2 days  Penicillin and linezolid (per ID, likely to d/c zyvox after surgery ) ID c/s -> appreciate recs. Echocardiogram EF 55-60%, no RWMA S/p Tdap 10/2 in ED. Patient will be discharged with Prevena plus portable wound VAC pump   Left Upper Lip Ulceration: Possibly  related to surgery. Continue to monitor.   Elevated LFT's: ? Shock liver RUQ [place] without concerning findings of liver and gallbladder. Acute hepatitis panel  negative Fluctuating, Continue to monitor   Acute kidney Injury: > Resolved. Baseline creatinine unknown Suspect due to sepsis Follow renal US (normal), UA unrevealing   Hypokalemia: Replaced and resolved.   Leukocytosis: Likely secondary to above. Continue antibiotics.  DVT prophylaxis: Lovenox Code Status: Full code Family Communication: Spoke with mother on the phone. Disposition Plan: Status is: Inpatient Remains inpatient appropriate because: Admitted for necrotizing fasciitis , underwent multiple debridements of necrotizing fasciitis right lower extremity. Patient weightbearing as tolerated,  would be discharged with wound VAC.  Consultants:  Infectious diseases Orthopedics  Procedures: Antimicrobials: Anti-infectives (From admission, onward)    Start     Dose/Rate Route Frequency Ordered Stop   10/27/22 0630  ceFAZolin (ANCEF) IVPB 2g/100 mL premix        2 g 200 mL/hr over 30 Minutes Intravenous On call to O.R. 10/27/22 0539 10/27/22 1514   10/25/22 1550  vancomycin (VANCOCIN) powder  Status:  Discontinued          As needed 10/25/22 1551 10/25/22 1612   10/25/22 0600  ceFAZolin (ANCEF) IVPB 2g/100 mL premix        2 g 200 mL/hr over 30 Minutes Intravenous On call to O.R. 10/24/22 1617 10/25/22 1527   10/20/22 2200  linezolid (ZYVOX) tablet 600 mg        600 mg Oral Every 12 hours 10/20/22 1018 10/27/22 2200   10/19/22 1300  penicillin G potassium  12 Million Units in dextrose 5 % 500 mL CONTINUOUS infusion        12 Million Units 41.7 mL/hr over 12 Hours Intravenous Every 12 hours 10/19/22 1213     10/19/22 1300  linezolid (ZYVOX) IVPB 600 mg  Status:  Discontinued        600 mg 300 mL/hr over 60 Minutes Intravenous Every 12 hours 10/19/22 1213 10/20/22 1018   10/19/22 0600  vancomycin (VANCOCIN) IVPB  1000 mg/200 mL premix  Status:  Discontinued        1,000 mg 200 mL/hr over 60 Minutes Intravenous Every 12 hours 10/18/22 1734 10/19/22 1213   10/19/22 0000  ceFEPIme (MAXIPIME) 2 g in sodium chloride 0.9 % 100 mL IVPB  Status:  Discontinued        2 g 200 mL/hr over 30 Minutes Intravenous Every 8 hours 10/18/22 1734 10/19/22 1213   10/18/22 2130  vancomycin (VANCOCIN) IVPB 1000 mg/200 mL premix  Status:  Discontinued        1,000 mg 200 mL/hr over 60 Minutes Intravenous  Once 10/18/22 2033 10/18/22 2047   10/18/22 1630  ceFEPIme (MAXIPIME) 2 g in sodium chloride 0.9 % 100 mL IVPB        2 g 200 mL/hr over 30 Minutes Intravenous  Once 10/18/22 1616 10/18/22 1820   10/18/22 1630  metroNIDAZOLE (FLAGYL) IVPB 500 mg        500 mg 100 mL/hr over 60 Minutes Intravenous  Once 10/18/22 1616 10/18/22 1819   10/18/22 1630  vancomycin (VANCOCIN) IVPB 1000 mg/200 mL premix        1,000 mg 200 mL/hr over 60 Minutes Intravenous  Once 10/18/22 1616 10/18/22 1924      Subjective: Patient was seen and examined at bedside. Overnight events noted.   Patient reports doing better, pain is better controlled.  RLE swelling is much improved. Wound VAC still shows bloody fluid. He is status post debridement for the necrotizing fasciitis.   Objective: Vitals:   10/27/22 1700 10/27/22 1942 10/28/22 0439 10/28/22 0731  BP: (!) 144/79 125/75 122/73 119/76  Pulse: 68 84 77 78  Resp: 15 18 18 16   Temp: 97.8 F (36.6 C) 98.9 F (37.2 C) 98 F (36.7 C) 98.4 F (36.9 C)  TempSrc:  Oral Oral Oral  SpO2: 99% 98% 99% 100%  Weight:      Height:        Intake/Output Summary (Last 24 hours) at 10/28/2022 1320 Last data filed at 10/28/2022 4098 Gross per 24 hour  Intake 2117.1 ml  Output 525 ml  Net 1592.1 ml   Filed Weights   10/18/22 1518 10/19/22 0350  Weight: 74.8 kg 75.1 kg    Examination:  General exam: Appears calm and comfortable , not in any acute distress. Respiratory system: CTA  bilaterally. Respiratory effort normal.  RR 15 Cardiovascular system: S1 & S2 heard, RRR. No JVD, murmurs, rubs, gallops or clicks.  Gastrointestinal system: Abdomen is nondistended, soft and nontender. Normal bowel sounds heard. Central nervous system: Alert and oriented x 3. No focal neurological deficits. Extremities: RLE less swollen, tender, covered in dressing, blood noted in wound VAC. Skin: No rashes, lesions or ulcers Psychiatry: Judgement and insight appear normal. Mood & affect appropriate.     Data Reviewed: I have personally reviewed following labs and imaging studies  CBC: Recent Labs  Lab 10/22/22 0639 10/23/22 1008 10/24/22 0522 10/25/22 0531 10/25/22 2158 10/26/22 0805 10/27/22 0751 10/28/22 0525  WBC 17.2* 18.9*   < >  18.7* 29.4* 17.6* 15.4* 18.4*  NEUTROABS 12.2* 14.7*  --   --   --   --   --   --   HGB 11.8* 12.8*   < > 12.7* 12.1* 11.0* 10.7* 9.8*  HCT 32.4* 35.9*   < > 36.4* 34.9* 31.6* 30.6* 28.0*  MCV 84.6 85.7   < > 86.7 87.7 88.3 89.5 89.5  PLT 231 298   < > 388 447* 447* 465* 514*   < > = values in this interval not displayed.   Basic Metabolic Panel: Recent Labs  Lab 10/22/22 0639 10/23/22 1008 10/24/22 0522 10/25/22 0531 10/26/22 0805 10/27/22 0751  NA 135 136 138  --  135 135  K 3.4* 4.3 4.0  --  4.3 4.3  CL 101 100 102  --  98 98  CO2 25 26 27   --  27 27  GLUCOSE 100* 120* 94  --  105* 106*  BUN 10 12 15   --  11 18  CREATININE 1.11 1.02 1.01 0.97 1.02 1.04  CALCIUM 7.6* 8.1* 8.0*  --  8.8* 9.0  MG 1.9 2.3 2.0  --   --  2.1  PHOS 3.4 3.2 3.6  --   --  3.2   GFR: Estimated Creatinine Clearance: 112.3 mL/min (by C-G formula based on SCr of 1.04 mg/dL). Liver Function Tests: Recent Labs  Lab 10/22/22 0639 10/23/22 1008 10/24/22 0522 10/26/22 0805  AST 135* 67* 84* 58*  ALT 182* 162* 152* 131*  ALKPHOS 183* 191* 165* 190*  BILITOT 0.9 0.5 0.4 0.6  PROT 5.3* 5.8* 5.3* 6.7  ALBUMIN 2.0* 2.1* 2.1* 2.7*   No results for  input(s): "LIPASE", "AMYLASE" in the last 168 hours. No results for input(s): "AMMONIA" in the last 168 hours. Coagulation Profile: No results for input(s): "INR", "PROTIME" in the last 168 hours.  Cardiac Enzymes: No results for input(s): "CKTOTAL", "CKMB", "CKMBINDEX", "TROPONINI" in the last 168 hours. BNP (last 3 results) No results for input(s): "PROBNP" in the last 8760 hours. HbA1C: No results for input(s): "HGBA1C" in the last 72 hours. CBG: No results for input(s): "GLUCAP" in the last 168 hours. Lipid Profile: No results for input(s): "CHOL", "HDL", "LDLCALC", "TRIG", "CHOLHDL", "LDLDIRECT" in the last 72 hours. Thyroid Function Tests: No results for input(s): "TSH", "T4TOTAL", "FREET4", "T3FREE", "THYROIDAB" in the last 72 hours. Anemia Panel: No results for input(s): "VITAMINB12", "FOLATE", "FERRITIN", "TIBC", "IRON", "RETICCTPCT" in the last 72 hours. Sepsis Labs: No results for input(s): "PROCALCITON", "LATICACIDVEN" in the last 168 hours.   Recent Results (from the past 240 hour(s))  Culture, blood (Routine x 2)     Status: Abnormal   Collection Time: 10/18/22  3:26 PM   Specimen: BLOOD LEFT ARM  Result Value Ref Range Status   Specimen Description   Final    BLOOD LEFT ARM Performed at [facility], [address] [place], [place] [address]    Special Requests   Final    BOTTLES DRAWN AEROBIC AND ANAEROBIC Blood Culture adequate volume Performed at [facility], [address], [place], [place] [address]    Culture  Setup Time   Final    GRAM POSITIVE COCCI IN CHAINS ANAEROBIC BOTTLE ONLY CRITICAL RESULT CALLED TO, READ BACK BY AND VERIFIED WITH: PHARMD BAILEY A 1153 FCP    Culture (A)  Final    STREPTOCOCCUS PYOGENES HEALTH DEPARTMENT NOTIFIED Performed at [facility], [address], [place], [place] [address]  Report Status 10/21/2022 FINAL  Final   Organism ID, Bacteria STREPTOCOCCUS PYOGENES  Final       Susceptibility   Streptococcus pyogenes - MIC*    PENICILLIN <=0.06 SENSITIVE Sensitive     CEFTRIAXONE <=0.12 SENSITIVE Sensitive     ERYTHROMYCIN <=0.12 SENSITIVE Sensitive     LEVOFLOXACIN 0.5 SENSITIVE Sensitive     VANCOMYCIN 0.25 SENSITIVE Sensitive     * STREPTOCOCCUS PYOGENES  Blood Culture ID Panel (Reflexed)     Status: Abnormal   Collection Time: 10/18/22  3:26 PM  Result Value Ref Range Status   Enterococcus faecalis NOT DETECTED NOT DETECTED Final   Enterococcus Faecium NOT DETECTED NOT DETECTED Final   Listeria monocytogenes NOT DETECTED NOT DETECTED Final   Staphylococcus species NOT DETECTED NOT DETECTED Final   Staphylococcus aureus (BCID) NOT DETECTED NOT DETECTED Final   Staphylococcus epidermidis NOT DETECTED NOT DETECTED Final   Staphylococcus lugdunensis NOT DETECTED NOT DETECTED Final   Streptococcus species DETECTED (A) NOT DETECTED Final    Comment: CRITICAL RESULT CALLED TO, READ BACK BY AND VERIFIED WITH: PHARMD BAILEY A 1153 FCP    Streptococcus agalactiae NOT DETECTED NOT DETECTED Final   Streptococcus pneumoniae NOT DETECTED NOT DETECTED Final   Streptococcus pyogenes DETECTED (A) NOT DETECTED Final    Comment: CRITICAL RESULT CALLED TO, READ BACK BY AND VERIFIED WITH: PHARMD BAILEY A 1153 FCP    A.calcoaceticus-baumannii NOT DETECTED NOT DETECTED Final   Bacteroides fragilis NOT DETECTED NOT DETECTED Final   Enterobacterales NOT DETECTED NOT DETECTED Final   Enterobacter cloacae complex NOT DETECTED NOT DETECTED Final   Escherichia coli NOT DETECTED NOT DETECTED Final   Klebsiella aerogenes NOT DETECTED NOT DETECTED Final   Klebsiella oxytoca NOT DETECTED NOT DETECTED Final   Klebsiella pneumoniae NOT DETECTED NOT DETECTED Final   Proteus species NOT DETECTED NOT DETECTED Final   Salmonella species NOT DETECTED NOT DETECTED Final   Serratia marcescens NOT DETECTED NOT DETECTED Final   Haemophilus influenzae NOT DETECTED NOT DETECTED Final    Neisseria meningitidis NOT DETECTED NOT DETECTED Final   Pseudomonas aeruginosa NOT DETECTED NOT DETECTED Final   Stenotrophomonas maltophilia NOT DETECTED NOT DETECTED Final   Candida albicans NOT DETECTED NOT DETECTED Final   Candida auris NOT DETECTED NOT DETECTED Final   Candida glabrata NOT DETECTED NOT DETECTED Final   Candida krusei NOT DETECTED NOT DETECTED Final   Candida parapsilosis NOT DETECTED NOT DETECTED Final   Candida tropicalis NOT DETECTED NOT DETECTED Final   Cryptococcus neoformans/gattii NOT DETECTED NOT DETECTED Final    Comment: Performed at [facility], [address] [place], [place] [address]  Culture, blood (Routine x 2)     Status: None   Collection Time: 10/18/22  3:31 PM   Specimen: BLOOD  Result Value Ref Range Status   Specimen Description   Final    BLOOD LEFT ANTECUBITAL Performed at [facility], [address] [place], [place] [address]    Special Requests   Final    BOTTLES DRAWN AEROBIC AND ANAEROBIC Blood Culture adequate volume Performed at [facility], [address] [place], [place] [address]    Culture   Final    NO GROWTH 5 DAYS Performed at [facility], [address] [place], [place] [address]    Report Status 10/23/2022 FINAL  Final  Culture, blood (Routine X 2) w Reflex to ID Panel     Status: None   Collection Time: 10/20/22  5:52 AM   Specimen: BLOOD  Result Value Ref Range Status   Specimen Description BLOOD BLOOD RIGHT ARM  Final   Special Requests   Final    BOTTLES DRAWN AEROBIC AND ANAEROBIC Blood Culture adequate volume   Culture   Final    NO GROWTH 5 DAYS Performed at [facility], [address], [place], [place] [address]    Report Status 10/25/2022 FINAL  Final  Culture, blood (Routine X 2) w Reflex to ID Panel     Status: None   Collection Time: 10/20/22  5:57 AM   Specimen: BLOOD  Result Value Ref Range Status   Specimen Description BLOOD BLOOD  RIGHT ARM  Final   Special Requests   Final    BOTTLES DRAWN AEROBIC AND ANAEROBIC Blood Culture adequate volume   Culture   Final    NO GROWTH 5 DAYS Performed at [facility], [address] [place], [place] [address]    Report Status 10/25/2022 FINAL  Final  Aerobic/Anaerobic Culture w Gram Stain (surgical/deep wound)     Status: None   Collection Time: 10/22/22  2:51 PM   Specimen: Soft Tissue, Other  Result Value Ref Range Status   Specimen Description TISSUE  Final   Special Requests right gastroc fascia  Final   Gram Stain NO WBC SEEN NO ORGANISMS SEEN   Final   Culture   Final    No growth aerobically or anaerobically. Performed at [facility], [address] [place], [place] [address]    Report Status 10/27/2022 FINAL  Final  Surgical pcr screen     Status: None   Collection Time: 10/27/22  6:22 AM   Specimen: Nasal Mucosa; Nasal Swab  Result Value Ref Range Status   MRSA, PCR NEGATIVE NEGATIVE Final   Staphylococcus aureus NEGATIVE NEGATIVE Final    Comment: (NOTE) The Xpert SA Assay (FDA approved for NASAL specimens in patients 38 years of age and older), is one component of a comprehensive surveillance program. It is not intended to diagnose infection nor to guide or monitor treatment. Performed at [facility], [address] [place], [place] [address]   Aerobic/Anaerobic Culture w Gram Stain (surgical/deep wound)     Status: None (Preliminary result)   Collection Time: 10/27/22  3:31 PM   Specimen: PATH Soft tissue  Result Value Ref Range Status   Specimen Description TISSUE RIGHT KNEE  Final   Special Requests PT ON ANCEF  Final   Gram Stain   Final    NO WBC SEEN NO ORGANISMS SEEN Performed at [facility], [address] [place], [place] [address]    Culture PENDING  Incomplete   Report Status PENDING  Incomplete    Radiology Studies: No results found.  Scheduled Meds:  acetaminophen  1,000 mg Oral Q8H   vitamin C   1,000 mg Oral Daily   docusate sodium  100 mg Oral BID   mupirocin ointment  1 Application Nasal BID   nutrition supplement (JUVEN)  1 packet Oral BID BM   pantoprazole  40 mg Oral Daily   zinc sulfate  220 mg Oral Daily   Continuous Infusions:  sodium chloride     magnesium sulfate bolus IVPB     methocarbamol (ROBAXIN) IV     penicillin G potassium 12 Million Units in dextrose 5 % 500 mL CONTINUOUS infusion 41.7 mL/hr at 10/28/22 0639     LOS: 10 days  Time spent: 35 mins    Willeen Niece, MD [facility]   If 7PM-7AM, please contact night-coverage

## 2022-10-28 NOTE — Progress Notes (Signed)
Patient ID: Dean Nichols, male   DOB: 1995-04-14, 27 y.o.   MRN: [id number] Patient is status post repeat debridement necrotizing fasciitis of the right leg.  No drainage in the wound VAC canister.  Patient may be weightbearing as tolerated.  Once patient is safe with therapy he can discharge from an orthopedic standpoint.  Patient will discharge with the Prevena plus portable wound VAC pump.

## 2022-10-29 ENCOUNTER — Encounter (HOSPITAL_COMMUNITY): Payer: Self-pay | Admitting: Orthopedic Surgery

## 2022-10-29 ENCOUNTER — Other Ambulatory Visit: Payer: Self-pay

## 2022-10-29 DIAGNOSIS — A419 Sepsis, unspecified organism: Secondary | ICD-10-CM | POA: Diagnosis not present

## 2022-10-29 DIAGNOSIS — L0390 Cellulitis, unspecified: Secondary | ICD-10-CM | POA: Diagnosis not present

## 2022-10-29 MED ORDER — SODIUM CHLORIDE 0.9% FLUSH: 10.0000 mL | INTRAVENOUS | Status: DC | PRN

## 2022-10-29 MED ORDER — CHLORHEXIDINE GLUCONATE CLOTH 2 % EX PADS
6.0000 | MEDICATED_PAD | Freq: Every day | CUTANEOUS | Status: DC
  Administered 2022-10-29 – 2022-10-30 (×2): 6 via TOPICAL

## 2022-10-29 NOTE — Progress Notes (Signed)
Physical Therapy Treatment Patient Details Name: Dean Nichols MRN: [id number] DOB: 1995-08-07 Today's Date: 10/29/2022   History of Present Illness 27 yo male admitted 10/2 with RLE pain, edema with sepsis and cellulits after cutting leg on metal 9/30. Pt with necrotizing fasciitis s/p debridement of Rt calf and thigh 10/6 & 10/9, 10/11. PMhx; Rt ACL tear    PT Comments  Continuing work on functional mobility and activity tolerance;  Session focused on progressive amb and initiating stair training, with good progress;  Managing quite wll with RW, and has used crutches before; for next session, will practice wth crutches and RW and see which pt prefers; Will likely not need HHPT; on track for dc home tomorrow   If plan is discharge home, recommend the following: A little help with bathing/dressing/bathroom   Can travel by private vehicle        Equipment Recommendations  Rolling walker (2 wheels);Crutches;Other (comment) (and DME recs by OT; will work with crutches vs RW next session, and see which one pt prefers)    Recommendations for Other Services OT consult     Precautions / Restrictions Precautions Precautions: Fall;Other (comment) Precaution Comments: R LE wound vac Restrictions Weight Bearing Restrictions: No     Mobility  Bed Mobility Overal bed mobility: Needs Assistance Bed Mobility: Supine to Sit, Sit to Supine     Supine to sit: Supervision Sit to supine: Supervision   General bed mobility comments: no physical assist, supervision for lines    Transfers Overall transfer level: Needs assistance Equipment used: Rolling walker (2 wheels) Transfers: Sit to/from Stand Sit to Stand: Supervision           General transfer comment: Patient able to complete sit<>stands at supervision level without issue, good RW technique throughout    Ambulation/Gait Ambulation/Gait assistance: Supervision Gait Distance (Feet): 200 Feet Assistive device: Rolling walker (2  wheels) Gait Pattern/deviations: Step-through pattern (emerging)       General Gait Details: Overall walking well, accepting some wiehgt onto RLE in stance, and using RW to [place] RLE for help with pain control and activity tolerance; Tends to keep RLE in semi plantarflexed position, heel slightly up; when makes an effort to get heel to floor, tends to felx the knee   Stairs         General stair comments: Demonstrated options for negotiating stairs with single rail R and L   Wheelchair Mobility     Tilt Bed    Modified Rankin (Stroke Patients Only)       Balance Overall balance assessment: Mild deficits observed, not formally tested                                          Cognition Arousal: Alert Behavior During Therapy: WFL for tasks assessed/performed Overall Cognitive Status: Within Functional Limits for tasks assessed                                          Exercises      General Comments General comments (skin integrity, edema, etc.): We discussed ways to help manage IV meds and preveena dressing at home      Pertinent Vitals/Pain Pain Assessment Pain Assessment: 0-10 Pain Score: 6  Pain Location: R LE Pain Descriptors / Indicators: Aching, Guarding  Pain Intervention(s): Monitored during session    Home Living Family/patient expects to be discharged to:: Private residence Living Arrangements: Parent Available Help at Discharge: Family;Available PRN/intermittently Type of Home: House Home Access: Stairs to enter Entrance Stairs-Rails: Right Entrance Stairs-Number of Steps: 3-5 Alternate Level Stairs-Number of Steps: 14 Home Layout: Two level;1/2 bath on main level;Able to live on main level with bedroom/bathroom Home Equipment: Crutches Additional Comments: pt normally lives in 3rd floor apartment with dad who is a Naval architect, plans to stay with mom in 2 story house with couch downstairs and siblings able to  assist    Prior Function            PT Goals (current goals can now be found in the care plan section) Acute Rehab PT Goals Patient Stated Goal: return home, work, watch tv PT Goal Formulation: With patient Time For Goal Achievement: 11/09/22 Potential to Achieve Goals: Good Progress towards PT goals: Progressing toward goals    Frequency    Min 1X/week      PT Plan      Co-evaluation              AM-PAC PT "6 Clicks" Mobility   Outcome Measure  Help needed turning from your back to your side while in a flat bed without using bedrails?: None Help needed moving from lying on your back to sitting on the side of a flat bed without using bedrails?: None Help needed moving to and from a bed to a chair (including a wheelchair)?: None Help needed standing up from a chair using your arms (e.g., wheelchair or bedside chair)?: None Help needed to walk in hospital room?: None Help needed climbing 3-5 steps with a railing? : A Little 6 Click Score: 23    End of Session   Activity Tolerance: Patient tolerated treatment well Patient left: in bed;with call bell/phone within reach Nurse Communication: Mobility status PT Visit Diagnosis: Other abnormalities of gait and mobility (R26.89);Muscle weakness (generalized) (M62.81)     Time: 8295-6213 PT Time Calculation (min) (ACUTE ONLY): 29 min  Charges:    $Gait Training: 23-37 mins PT General Charges $$ ACUTE PT VISIT: 1 Visit                     Van Clines, PT  Acute Rehabilitation Services Office [phone] Secure Chat welcomed    Dean Nichols 10/29/2022, 9:46 AM

## 2022-10-29 NOTE — Progress Notes (Signed)
Peripherally Inserted Central Catheter Placement  The IV Nurse has discussed with the patient and/or persons authorized to consent for the patient, the purpose of this procedure and the potential benefits and risks involved with this procedure.  The benefits include less needle sticks, lab draws from the catheter, and the patient may be discharged home with the catheter. Risks include, but not limited to, infection, bleeding, blood clot (thrombus formation), and puncture of an artery; nerve damage and irregular heartbeat and possibility to perform a PICC exchange if needed/ordered by physician.  Alternatives to this procedure were also discussed.  Bard Power PICC patient education guide, fact sheet on infection prevention and patient information card has been provided to patient /or left at bedside.    PICC Placement Documentation  PICC Single Lumen 10/29/22 Right Basilic 41 cm 0 cm (Active)  Indication for Insertion or Continuance of Line Home intravenous therapies (PICC only) 10/29/22 1300  Exposed Catheter (cm) 0 cm 10/29/22 1300  Site Assessment Clean, Dry, Intact 10/29/22 1300  Line Status Saline locked;Blood return noted 10/29/22 1300  Dressing Type Transparent;Securing device 10/29/22 1300  Dressing Status Antimicrobial disc in place;Clean, Dry, Intact 10/29/22 1300  Line Care Connections checked and tightened 10/29/22 1300  Line Adjustment (NICU/IV Team Only) No 10/29/22 1300  Dressing Intervention New dressing;Adhesive placed at insertion site (IV team only) 10/29/22 1300  Dressing Change Due 11/05/22 10/29/22 1300       Burnard Bunting Chenice 10/29/2022, 1:02 PM

## 2022-10-29 NOTE — Progress Notes (Signed)
Occupational Therapy Treatment Patient Details Name: Dean Nichols MRN: [id number] DOB: 09-15-1995 Today's Date: 10/29/2022   History of present illness 27 yo male admitted 10/2 with RLE pain, edema with sepsis and cellulits after cutting leg on metal 9/30. Pt with necrotizing fasciitis s/p debridement of Rt calf and thigh 10/6 & 10/9. PMhx; Rt ACL tear   OT comments  Patient educated on lower body dressing techniques with the wound vac, endorsed that he will have help as he is still having some limitations reaching his R foot (patient purchasing Crocs to assist with independence and function) Educated on use of shower seat and completing tiolet transfer and shower transfer with OT at supervision level. All questions answered at end of session. OT will continue to follow acutely, however does not need further OT services at discharge.       If plan is discharge home, recommend the following:  A little help with walking and/or transfers;Assistance with cooking/housework;Assist for transportation;Help with stairs or ramp for entrance;A little help with bathing/dressing/bathroom   Equipment Recommendations  None recommended by OT    Recommendations for Other Services      Precautions / Restrictions Precautions Precautions: Fall;Other (comment) Precaution Comments: R LE wound vac Restrictions Weight Bearing Restrictions: No       Mobility Bed Mobility Overal bed mobility: Needs Assistance Bed Mobility: Supine to Sit, Sit to Supine     Supine to sit: Supervision Sit to supine: Supervision   General bed mobility comments: no physical assist    Transfers Overall transfer level: Needs assistance Equipment used: Rolling walker (2 wheels) Transfers: Sit to/from Stand Sit to Stand: Supervision           General transfer comment: Patient able to complete sit<>stands at supervision level without issue, good RW technique throughout     Balance Overall balance assessment: Mild  deficits observed, not formally tested                                         ADL either performed or assessed with clinical judgement   ADL Overall ADL's : Needs assistance/impaired Eating/Feeding: Independent;Sitting   Grooming: Set up;Sitting                                 General ADL Comments: Patient educated on lower body dressing techniques with the wound vac, endorsed that he will have help as he is still having some limitations reaching his R foot (patient purchasing Crocs to assist with independence and function) Educated on use of shower seat and completing tiolet transfer and shower transfer with OT at supervision level. All questions answered at end of session. OT will continue to follow acutely, however does not need further OT services at discharge.    Extremity/Trunk Assessment         Cervical / Trunk Assessment Cervical / Trunk Assessment: Normal    Vision Baseline Vision/History: 0 No visual deficits     Perception     Praxis      Cognition Arousal: Alert Behavior During Therapy: WFL for tasks assessed/performed Overall Cognitive Status: Within Functional Limits for tasks assessed  Exercises      Shoulder Instructions       General Comments      Pertinent Vitals/ Pain       Pain Assessment Pain Assessment: Faces Faces Pain Scale: No hurt Pain Intervention(s): Repositioned, Premedicated before session  Home Living Family/patient expects to be discharged to:: Private residence Living Arrangements: Parent Available Help at Discharge: Family;Available PRN/intermittently Type of Home: House Home Access: Stairs to enter Entrance Stairs-Number of Steps: 3-5 Entrance Stairs-Rails: Right Home Layout: Two level;1/2 bath on main level;Able to live on main level with bedroom/bathroom Alternate Level Stairs-Number of Steps: 14   Bathroom Shower/Tub: Multimedia programmer: Standard     Home Equipment: Crutches   Additional Comments: pt normally lives in 3rd floor apartment with dad who is a Naval architect, plans to stay with mom in 2 story house with couch downstairs and siblings able to assist      Prior Functioning/Environment              Frequency  Min 1X/week        Progress Toward Goals  OT Goals(current goals can now be found in the care plan section)  Progress towards OT goals: Progressing toward goals  Acute Rehab OT Goals OT Goal Formulation: With patient Time For Goal Achievement: 11/10/22 Potential to Achieve Goals: Good  Plan      Co-evaluation                 AM-PAC OT "6 Clicks" Daily Activity     Outcome Measure   Help from another person eating meals?: None Help from another person taking care of personal grooming?: A Little Help from another person toileting, which includes using toliet, bedpan, or urinal?: A Little Help from another person bathing (including washing, rinsing, drying)?: A Little Help from another person to put on and taking off regular upper body clothing?: A Little Help from another person to put on and taking off regular lower body clothing?: A Little 6 Click Score: 19    End of Session Equipment Utilized During Treatment: Rolling walker (2 wheels)  OT Visit Diagnosis: Pain Pain - Right/Left: Right Pain - part of body: Leg   Activity Tolerance Patient tolerated treatment well   Patient Left in bed;with call bell/phone within reach;with family/visitor present   Nurse Communication Mobility status        Time: 9604-5409 OT Time Calculation (min): 14 min  Charges: OT General Charges $OT Visit: 1 Visit OT Treatments $Self Care/Home Management : 8-22 mins  Pollyann Glen E. Matther Labell, OTR/L Acute Rehabilitation Services [phone]   Cherlyn Cushing 10/29/2022, 8:41 AM

## 2022-10-29 NOTE — Plan of Care (Signed)
  Problem: Education: Goal: Knowledge of General Education information will improve Description: Including pain rating scale, medication(s)/side effects and non-pharmacologic comfort measures Outcome: Progressing   Problem: Health Behavior/Discharge Planning: Goal: Ability to manage health-related needs will improve Outcome: Progressing   Problem: Clinical Measurements: Goal: Ability to maintain clinical measurements within normal limits will improve Outcome: Progressing Goal: Will remain free from infection Outcome: Progressing Goal: Diagnostic test results will improve Outcome: Progressing Goal: Respiratory complications will improve Outcome: Progressing Goal: Cardiovascular complication will be avoided Outcome: Progressing   Problem: Activity: Goal: Risk for activity intolerance will decrease Outcome: Progressing   Problem: Nutrition: Goal: Adequate nutrition will be maintained Outcome: Progressing   Problem: Coping: Goal: Level of anxiety will decrease Outcome: Progressing   Problem: Elimination: Goal: Will not experience complications related to bowel motility Outcome: Progressing Goal: Will not experience complications related to urinary retention Outcome: Progressing   Problem: Pain Managment: Goal: General experience of comfort will improve Outcome: Progressing   Problem: Safety: Goal: Ability to remain free from injury will improve Outcome: Progressing   Problem: Skin Integrity: Goal: Risk for impaired skin integrity will decrease Outcome: Progressing   Problem: Fluid Volume: Goal: Hemodynamic stability will improve Outcome: Progressing   Problem: Clinical Measurements: Goal: Diagnostic test results will improve Outcome: Progressing Goal: Signs and symptoms of infection will decrease Outcome: Progressing   Problem: Respiratory: Goal: Ability to maintain adequate ventilation will improve Outcome: Progressing   Problem: Clinical  Measurements: Goal: Ability to avoid or minimize complications of infection will improve Outcome: Progressing   Problem: Skin Integrity: Goal: Skin integrity will improve Outcome: Progressing   Problem: Clinical Measurements: Goal: Ability to avoid or minimize complications of infection will improve Outcome: Progressing   Problem: Skin Integrity: Goal: Skin integrity will improve Outcome: Progressing   Problem: Education: Goal: Knowledge of the prescribed therapeutic regimen will improve Outcome: Progressing Goal: Ability to verbalize activity precautions or restrictions will improve Outcome: Progressing Goal: Understanding of discharge needs will improve Outcome: Progressing   Problem: Activity: Goal: Ability to perform//tolerate increased activity and mobilize with assistive devices will improve Outcome: Progressing   Problem: Clinical Measurements: Goal: Postoperative complications will be avoided or minimized Outcome: Progressing   Problem: Self-Care: Goal: Ability to meet self-care needs will improve Outcome: Progressing   Problem: Self-Concept: Goal: Ability to maintain and perform role responsibilities to the fullest extent possible will improve Outcome: Progressing   Problem: Pain Management: Goal: Pain level will decrease with appropriate interventions Outcome: Progressing   Problem: Skin Integrity: Goal: Demonstration of wound healing without infection will improve Outcome: Progressing

## 2022-10-29 NOTE — Progress Notes (Signed)
PROGRESS NOTE    Dean Nichols  JXB:[id number] DOB: [birth year] DOA: 10/18/2022 PCP: Pcp, No   Brief Narrative:  This 27 yrs old male with medical history significant of previous history of ACL tear of the right knee joint, otherwise no significant medical history who was working apparently in Aeronautical engineer.  He was working in the yard when he accidentally cut his leg around the lateral aspect of the right knee with a metal pipe.  He presented to the ED from urgent care with fevers, chills, nausea, vomiting -> sepsis -> was admitted for sepsis due to right lower extremity cellulitis. He is found to have necrotizing fasciitis.  ID and ortho following.  Plan for OR 10/9 and 10/11 (s/p R leg debridement on 10/6).   Assessment & Plan:   Principal Problem:   Sepsis due to cellulitis [facility]) Active Problems:   AKI (acute kidney injury) (HCC)   Hyponatremia   Necrotizing fasciitis due to Streptococcus pyogenes (HCC)   Sepsis due to group A Streptococcus with acute organ dysfunction and septic shock (HCC)  Sepsis  / Streptococcus pyogenes bacteremia: Necrotizing fasciitis of right lower extremity Met criteria for sepsis on presentation, continues to meet criteria for sepsis with tachycardia, fever, leukocytosis. He spiked fever on 10/5, leukocytosis is persistent. Blood cultures showed Strep pyogenes in 1/2 bottles. Appreciate ortho recs - s/p R leg excisional debridement, necrotizing fasciitis of thigh and calf  Status post repeat debridement for necrotizing fasciitis.  POD 4 Tissue margins were clear, status post repeat debridement necrotizing fasciitis of right leg. Repeat cultures from 10/4 Ng x 2 Surgical culture 10/6 Neg x 2 days  Penicillin and linezolid (per ID, likely to d/c zyvox after surgery ) ID c/s -> appreciate recs. Echocardiogram EF 55-60%, no RWMA S/p Tdap 10/2 in ED. Patient will be discharged with Prevena plus portable wound VAC pump. PICC line ordered.   Left Upper Lip  Ulceration: Possibly related to surgery. Continue to monitor.   Elevated LFT's: ? Shock liver RUQ [place] without concerning findings of liver and gallbladder. Acute hepatitis panel  negative Fluctuating, Continue to monitor   Acute kidney Injury: > Resolved. Baseline creatinine unknown Suspect due to sepsis Follow renal US (normal), UA unrevealing   Hypokalemia: Replaced and resolved.   Leukocytosis: Likely secondary to above. Continue antibiotics.  DVT prophylaxis: Lovenox Code Status: Full code Family Communication: Spoke with mother on the phone. Disposition Plan: Status is: Inpatient Remains inpatient appropriate because: Admitted for necrotizing fasciitis , underwent multiple debridements of necrotizing fasciitis right lower extremity. Patient weightbearing as tolerated,  would be discharged with wound VAC.  Consultants:  Infectious diseases Orthopedics  Procedures: Antimicrobials: Anti-infectives (From admission, onward)    Start     Dose/Rate Route Frequency Ordered Stop   10/27/22 0630  ceFAZolin (ANCEF) IVPB 2g/100 mL premix        2 g 200 mL/hr over 30 Minutes Intravenous On call to O.R. 10/27/22 0539 10/27/22 1514   10/25/22 1550  vancomycin (VANCOCIN) powder  Status:  Discontinued          As needed 10/25/22 1551 10/25/22 1612   10/25/22 0600  ceFAZolin (ANCEF) IVPB 2g/100 mL premix        2 g 200 mL/hr over 30 Minutes Intravenous On call to O.R. 10/24/22 1617 10/25/22 1527   10/20/22 2200  linezolid (ZYVOX) tablet 600 mg        600 mg Oral Every 12 hours 10/20/22 1018 10/27/22 2200   10/19/22 1300  penicillin G potassium 12 Million Units in dextrose 5 % 500 mL CONTINUOUS infusion        12 Million Units 41.7 mL/hr over 12 Hours Intravenous Every 12 hours 10/19/22 1213     10/19/22 1300  linezolid (ZYVOX) IVPB 600 mg  Status:  Discontinued        600 mg 300 mL/hr over 60 Minutes Intravenous Every 12 hours 10/19/22 1213 10/20/22 1018   10/19/22 0600   vancomycin (VANCOCIN) IVPB 1000 mg/200 mL premix  Status:  Discontinued        1,000 mg 200 mL/hr over 60 Minutes Intravenous Every 12 hours 10/18/22 1734 10/19/22 1213   10/19/22 0000  ceFEPIme (MAXIPIME) 2 g in sodium chloride 0.9 % 100 mL IVPB  Status:  Discontinued        2 g 200 mL/hr over 30 Minutes Intravenous Every 8 hours 10/18/22 1734 10/19/22 1213   10/18/22 2130  vancomycin (VANCOCIN) IVPB 1000 mg/200 mL premix  Status:  Discontinued        1,000 mg 200 mL/hr over 60 Minutes Intravenous  Once 10/18/22 2033 10/18/22 2047   10/18/22 1630  ceFEPIme (MAXIPIME) 2 g in sodium chloride 0.9 % 100 mL IVPB        2 g 200 mL/hr over 30 Minutes Intravenous  Once 10/18/22 1616 10/18/22 1820   10/18/22 1630  metroNIDAZOLE (FLAGYL) IVPB 500 mg        500 mg 100 mL/hr over 60 Minutes Intravenous  Once 10/18/22 1616 10/18/22 1819   10/18/22 1630  vancomycin (VANCOCIN) IVPB 1000 mg/200 mL premix        1,000 mg 200 mL/hr over 60 Minutes Intravenous  Once 10/18/22 1616 10/18/22 1924      Subjective: Patient was seen and examined at bedside. Overnight events noted.   Patient reports pain is reasonably controlled.  Doing overall better.  RLE swelling is much improved. Wound VAC still shows bloody fluid. He is status post debridement for the necrotizing fasciitis.   Objective: Vitals:   10/28/22 1610 10/28/22 2027 10/29/22 0456 10/29/22 0755  BP: 119/67 116/65 122/68 108/62  Pulse: 88 71 79 64  Resp: 16 18 16 16   Temp: 98 F (36.7 C) 98.5 F (36.9 C) 98.2 F (36.8 C) 97.7 F (36.5 C)  TempSrc: Oral Oral Oral Oral  SpO2: 99% 99% 99% 100%  Weight:      Height:        Intake/Output Summary (Last 24 hours) at 10/29/2022 1036 Last data filed at 10/29/2022 0635 Gross per 24 hour  Intake 873.64 ml  Output 601 ml  Net 272.64 ml   Filed Weights   10/18/22 1518 10/19/22 0350  Weight: 74.8 kg 75.1 kg    Examination:  General exam: Appears calm and comfortable , not in any acute  distress. Respiratory system: CTA bilaterally. Respiratory effort normal.  RR 15 Cardiovascular system: S1 & S2 heard, RRR. No JVD, murmurs, rubs, gallops or clicks.  Gastrointestinal system: Abdomen is non distended, soft and non tender. Normal bowel sounds heard. Central nervous system: Alert and oriented x 3. No focal neurological deficits. Extremities: RLE less swollen, tender, covered in dressing, blood noted in wound VAC. Skin: No rashes, lesions or ulcers Psychiatry: Judgement and insight appear normal. Mood & affect appropriate.     Data Reviewed: I have personally reviewed following labs and imaging studies  CBC: Recent Labs  Lab 10/23/22 1008 10/24/22 0522 10/25/22 0531 10/25/22 2158 10/26/22 0805 10/27/22 1610 10/28/22 9604  WBC 18.9*   < > 18.7* 29.4* 17.6* 15.4* 18.4*  NEUTROABS 14.7*  --   --   --   --   --   --   HGB 12.8*   < > 12.7* 12.1* 11.0* 10.7* 9.8*  HCT 35.9*   < > 36.4* 34.9* 31.6* 30.6* 28.0*  MCV 85.7   < > 86.7 87.7 88.3 89.5 89.5  PLT 298   < > 388 447* 447* 465* 514*   < > = values in this interval not displayed.   Basic Metabolic Panel: Recent Labs  Lab 10/23/22 1008 10/24/22 0522 10/25/22 0531 10/26/22 0805 10/27/22 0751  NA 136 138  --  135 135  K 4.3 4.0  --  4.3 4.3  CL 100 102  --  98 98  CO2 26 27  --  27 27  GLUCOSE 120* 94  --  105* 106*  BUN 12 15  --  11 18  CREATININE 1.02 1.01 0.97 1.02 1.04  CALCIUM 8.1* 8.0*  --  8.8* 9.0  MG 2.3 2.0  --   --  2.1  PHOS 3.2 3.6  --   --  3.2   GFR: Estimated Creatinine Clearance: 112.3 mL/min (by C-G formula based on SCr of 1.04 mg/dL). Liver Function Tests: Recent Labs  Lab 10/23/22 1008 10/24/22 0522 10/26/22 0805  AST 67* 84* 58*  ALT 162* 152* 131*  ALKPHOS 191* 165* 190*  BILITOT 0.5 0.4 0.6  PROT 5.8* 5.3* 6.7  ALBUMIN 2.1* 2.1* 2.7*   No results for input(s): "LIPASE", "AMYLASE" in the last 168 hours. No results for input(s): "AMMONIA" in the last 168  hours. Coagulation Profile: No results for input(s): "INR", "PROTIME" in the last 168 hours.  Cardiac Enzymes: No results for input(s): "CKTOTAL", "CKMB", "CKMBINDEX", "TROPONINI" in the last 168 hours. BNP (last 3 results) No results for input(s): "PROBNP" in the last 8760 hours. HbA1C: No results for input(s): "HGBA1C" in the last 72 hours. CBG: No results for input(s): "GLUCAP" in the last 168 hours. Lipid Profile: No results for input(s): "CHOL", "HDL", "LDLCALC", "TRIG", "CHOLHDL", "LDLDIRECT" in the last 72 hours. Thyroid Function Tests: No results for input(s): "TSH", "T4TOTAL", "FREET4", "T3FREE", "THYROIDAB" in the last 72 hours. Anemia Panel: No results for input(s): "VITAMINB12", "FOLATE", "FERRITIN", "TIBC", "IRON", "RETICCTPCT" in the last 72 hours. Sepsis Labs: No results for input(s): "PROCALCITON", "LATICACIDVEN" in the last 168 hours.   Recent Results (from the past 240 hour(s))  Culture, blood (Routine X 2) w Reflex to ID Panel     Status: None   Collection Time: 10/20/22  5:52 AM   Specimen: BLOOD  Result Value Ref Range Status   Specimen Description BLOOD BLOOD RIGHT ARM  Final   Special Requests   Final    BOTTLES DRAWN AEROBIC AND ANAEROBIC Blood Culture adequate volume   Culture   Final    NO GROWTH 5 DAYS Performed at [facility], [address] [place], [place] [address]    Report Status 10/25/2022 FINAL  Final  Culture, blood (Routine X 2) w Reflex to ID Panel     Status: None   Collection Time: 10/20/22  5:57 AM   Specimen: BLOOD  Result Value Ref Range Status   Specimen Description BLOOD BLOOD RIGHT ARM  Final   Special Requests   Final    BOTTLES DRAWN AEROBIC AND ANAEROBIC Blood Culture adequate volume   Culture   Final    NO GROWTH 5 DAYS  Performed at [facility], [address], [place], [place] [address]    Report Status 10/25/2022 FINAL  Final  Aerobic/Anaerobic Culture w Gram Stain (surgical/deep wound)     Status: None    Collection Time: 10/22/22  2:51 PM   Specimen: Soft Tissue, Other  Result Value Ref Range Status   Specimen Description TISSUE  Final   Special Requests right gastroc fascia  Final   Gram Stain NO WBC SEEN NO ORGANISMS SEEN   Final   Culture   Final    No growth aerobically or anaerobically. Performed at [facility], [address] [place], [place] [address]    Report Status 10/27/2022 FINAL  Final  Surgical pcr screen     Status: None   Collection Time: 10/27/22  6:22 AM   Specimen: Nasal Mucosa; Nasal Swab  Result Value Ref Range Status   MRSA, PCR NEGATIVE NEGATIVE Final   Staphylococcus aureus NEGATIVE NEGATIVE Final    Comment: (NOTE) The Xpert SA Assay (FDA approved for NASAL specimens in patients 65 years of age and older), is one component of a comprehensive surveillance program. It is not intended to diagnose infection nor to guide or monitor treatment. Performed at [facility], [address], [place], [place] [address]   Aerobic/Anaerobic Culture w Gram Stain (surgical/deep wound)     Status: None (Preliminary result)   Collection Time: 10/27/22  3:31 PM   Specimen: PATH Soft tissue  Result Value Ref Range Status   Specimen Description TISSUE RIGHT KNEE  Final   Special Requests PT ON ANCEF  Final   Gram Stain NO WBC SEEN NO ORGANISMS SEEN   Final   Culture   Final    NO GROWTH < 24 HOURS Performed at [facility], [address], [place], [place] [address]    Report Status PENDING  Incomplete    Radiology Studies: [place] EKG SITE RITE  Result Date: 10/29/2022 If Site Rite image not attached, placement could not be confirmed due to current cardiac rhythm.   Scheduled Meds:  acetaminophen  1,000 mg Oral Q8H   vitamin C  1,000 mg Oral Daily   docusate sodium  100 mg Oral BID   enoxaparin (LOVENOX) injection  40 mg Subcutaneous Q24H   mupirocin ointment  1 Application Nasal BID   nutrition supplement (JUVEN)  1 packet Oral BID BM    pantoprazole  40 mg Oral Daily   zinc sulfate  220 mg Oral Daily   Continuous Infusions:  sodium chloride     magnesium sulfate bolus IVPB     methocarbamol (ROBAXIN) IV     penicillin G potassium 12 Million Units in dextrose 5 % 500 mL CONTINUOUS infusion 12 Million Units (10/29/22 0631)     LOS: 11 days    Time spent: 35 mins    Willeen Niece, MD [facility]   If 7PM-7AM, please contact night-coverage

## 2022-10-30 DIAGNOSIS — A419 Sepsis, unspecified organism: Secondary | ICD-10-CM | POA: Diagnosis not present

## 2022-10-30 DIAGNOSIS — B950 Streptococcus, group A, as the cause of diseases classified elsewhere: Secondary | ICD-10-CM | POA: Diagnosis not present

## 2022-10-30 DIAGNOSIS — L0390 Cellulitis, unspecified: Secondary | ICD-10-CM | POA: Diagnosis not present

## 2022-10-30 DIAGNOSIS — A400 Sepsis due to streptococcus, group A: Secondary | ICD-10-CM | POA: Diagnosis not present

## 2022-10-30 DIAGNOSIS — M726 Necrotizing fasciitis: Secondary | ICD-10-CM | POA: Diagnosis not present

## 2022-10-30 LAB — CBC
HCT: 24.4 % — ABNORMAL LOW (ref 39.0–52.0)
Hemoglobin: 8.6 g/dL — ABNORMAL LOW (ref 13.0–17.0)
MCH: 32.3 pg (ref 26.0–34.0)
MCHC: 35.2 g/dL (ref 30.0–36.0)
MCV: 91.7 fL (ref 80.0–100.0)
Platelets: 481 10*3/uL — ABNORMAL HIGH (ref 150–400)
RBC: 2.66 MIL/uL — ABNORMAL LOW (ref 4.22–5.81)
RDW: 13.7 % (ref 11.5–15.5)
WBC: 10.8 10*3/uL — ABNORMAL HIGH (ref 4.0–10.5)
nRBC: 0 % (ref 0.0–0.2)

## 2022-10-30 LAB — PHOSPHORUS: Phosphorus: 3.8 mg/dL (ref 2.5–4.6)

## 2022-10-30 LAB — COMPREHENSIVE METABOLIC PANEL
ALT: 53 U/L — ABNORMAL HIGH (ref 0–44)
AST: 25 U/L (ref 15–41)
Albumin: 2.8 g/dL — ABNORMAL LOW (ref 3.5–5.0)
Alkaline Phosphatase: 108 U/L (ref 38–126)
Anion gap: 7 (ref 5–15)
BUN: 22 mg/dL — ABNORMAL HIGH (ref 6–20)
CO2: 28 mmol/L (ref 22–32)
Calcium: 8.8 mg/dL — ABNORMAL LOW (ref 8.9–10.3)
Chloride: 101 mmol/L (ref 98–111)
Creatinine, Ser: 1.02 mg/dL (ref 0.61–1.24)
GFR, Estimated: >60 mL/min (ref >60)
Glucose, Bld: 106 mg/dL — ABNORMAL HIGH (ref 70–99)
Potassium: 3.9 mmol/L (ref 3.5–5.1)
Sodium: 136 mmol/L (ref 135–145)
Total Bilirubin: 0.4 mg/dL (ref 0.3–1.2)
Total Protein: 6.6 g/dL (ref 6.5–8.1)

## 2022-10-30 LAB — MAGNESIUM: Magnesium: 2.1 mg/dL (ref 1.7–2.4)

## 2022-10-30 MED ORDER — OXYCODONE HCL 5 MG PO TABS: 5.0000 mg | ORAL_TABLET | ORAL | 0 refills | Status: DC | PRN

## 2022-10-30 MED ORDER — METHOCARBAMOL 500 MG PO TABS: 500.0000 mg | ORAL_TABLET | Freq: Four times a day (QID) | ORAL | 0 refills | Status: AC | PRN

## 2022-10-30 MED ORDER — IBUPROFEN 400 MG PO TABS: 400.0000 mg | ORAL_TABLET | Freq: Four times a day (QID) | ORAL | 0 refills | Status: AC | PRN

## 2022-10-30 MED ORDER — AMOXICILLIN 500 MG PO CAPS: 1000.0000 mg | ORAL_CAPSULE | Freq: Three times a day (TID) | ORAL | 0 refills | Status: AC

## 2022-10-30 MED ORDER — PANTOPRAZOLE SODIUM 40 MG PO TBEC: 40.0000 mg | DELAYED_RELEASE_TABLET | Freq: Every day | ORAL | 0 refills | Status: DC

## 2022-10-30 MED ORDER — AMOXICILLIN 500 MG PO CAPS
1000.0000 mg | ORAL_CAPSULE | Freq: Three times a day (TID) | ORAL | Status: DC
  Administered 2022-10-30: 1000 mg via ORAL
  Filled 2022-10-30: qty 2

## 2022-10-30 NOTE — Progress Notes (Signed)
Physical Therapy Treatment Patient Details Name: Dean Nichols MRN: [id number] DOB: [birth year] Today's Date: 10/30/2022   History of Present Illness 27 yo male admitted 10/2 with RLE pain, edema with sepsis and cellulits after cutting leg on metal 9/30. Pt with necrotizing fasciitis s/p debridement of Rt calf and thigh 10/6 & 10/9, 10/11. PMhx; Rt ACL tear    PT Comments  Pt making good progress.  He has improved pain control and ability to WB on R LE.  He is transferring at mod I level and was able to ambulate 200' with crutches or single crutch.  Pt also demonstrating safe stair training with a crutch and rail.  Pt does still have limited knee ROM - encouraged to perform AROM as able, educated on transfer techniques, and possible need for outpt PT in future.    If plan is discharge home, recommend the following: Help with stairs or ramp for entrance   Can travel by private vehicle        Equipment Recommendations  Crutches    Recommendations for Other Services       Precautions / Restrictions Precautions Precautions: Fall;Other (comment) Precaution Comments: R LE wound vac Restrictions Weight Bearing Restrictions: Yes RLE Weight Bearing: Weight bearing as tolerated     Mobility  Bed Mobility Overal bed mobility: Modified Independent Bed Mobility: Supine to Sit, Sit to Supine     Supine to sit: Modified independent (Device/Increase time) Sit to supine: Modified independent (Device/Increase time)   General bed mobility comments: No assist; pt managing lines    Transfers Overall transfer level: Needs assistance Equipment used: Crutches Transfers: Sit to/from Stand Sit to Stand: Supervision           General transfer comment: Able to complete STS with crutches without assist or cues    Ambulation/Gait Ambulation/Gait assistance: Supervision Gait Distance (Feet): 200 Feet Assistive device: Crutches, None Gait Pattern/deviations: Step-through pattern Gait  velocity: decreased but functional     General Gait Details: Pt reports walked to restroom without AD earlier.  For session, starting initially with bil crutches.  He began with a swing through pattern and NWB on R LE.  Educated on stepping and normal gait pattern as he was able to WB earlier.  Pt able to progress to step through pattern with R heel flat on floor.  He requested to progress to 1 crutch - educated on crutch on opposite side - pt tolerated and demonstrated safe pattern.   Stairs Stairs: Yes Stairs assistance: Contact guard assist Stair Management: One rail Right, With crutches, Forwards Number of Stairs: 3 General stair comments: Rail on R and Cructh on L - pt with good crutch placement without cues , provided education on "up with good and down with bad." Demonstrated safely and without difficulty.   Wheelchair Mobility     Tilt Bed    Modified Rankin (Stroke Patients Only)       Balance Overall balance assessment: Needs assistance Sitting-balance support: No upper extremity supported Sitting balance-Leahy Scale: Normal     Standing balance support: No upper extremity supported Standing balance-Leahy Scale: Good                              Cognition Arousal: Alert Behavior During Therapy: WFL for tasks assessed/performed Overall Cognitive Status: Within Functional Limits for tasks assessed  Exercises      General Comments General comments (skin integrity, edema, etc.): Educated on gradually increasing ROM as able.  Pt demonstrating LAQ and knee flexion.  Educated on car transfers, use of AD, and safety.  Educated on elevating at rest.      Pertinent Vitals/Pain Pain Assessment Pain Assessment: Faces Faces Pain Scale: Hurts a little bit Pain Location: R LE Pain Descriptors / Indicators: Discomfort Pain Intervention(s): Limited activity within patient's tolerance, Monitored during  session    Home Living                          Prior Function            PT Goals (current goals can now be found in the care plan section) Progress towards PT goals: Progressing toward goals    Frequency    Min 1X/week      PT Plan      Co-evaluation              AM-PAC PT "6 Clicks" Mobility   Outcome Measure  Help needed turning from your back to your side while in a flat bed without using bedrails?: None Help needed moving from lying on your back to sitting on the side of a flat bed without using bedrails?: None Help needed moving to and from a bed to a chair (including a wheelchair)?: None Help needed standing up from a chair using your arms (e.g., wheelchair or bedside chair)?: None Help needed to walk in hospital room?: A Little Help needed climbing 3-5 steps with a railing? : A Little 6 Click Score: 22    End of Session Equipment Utilized During Treatment: Gait belt Activity Tolerance: Patient tolerated treatment well Patient left: in bed;with call bell/phone within reach Nurse Communication: Mobility status PT Visit Diagnosis: Other abnormalities of gait and mobility (R26.89);Muscle weakness (generalized) (M62.81)     Time: 1250-1311 PT Time Calculation (min) (ACUTE ONLY): 21 min  Charges:    $Gait Training: 8-22 mins PT General Charges $$ ACUTE PT VISIT: 1 Visit                     Anise Salvo, PT Acute Rehab Services [facility] [phone]    Rayetta Humphrey 10/30/2022, 1:47 PM

## 2022-10-30 NOTE — Progress Notes (Signed)
Occupational Therapy Treatment Patient Details Name: Dean Nichols MRN: [id number] DOB: 08-15-1995 Today's Date: 10/30/2022   History of present illness 27 yo male admitted 10/2 with RLE pain, edema with sepsis and cellulits after cutting leg on metal 9/30. Pt with necrotizing fasciitis s/p debridement of Rt calf and thigh 10/6 & 10/9, 10/11. PMhx; Rt ACL tear   OT comments  Pt progressing towards goals, reports no concerns with plan for d/c home today. Pt able to complete simulated tub transfer and toilet transfer with supervision, no AD. Reiterated technique for LB dressing and bathing when pt has preveena wound vac. Pt verbalized understanding. Pt presenting with impairments listed below, will follow acutely. Anticipate no OT follow up needs at d/c.       If plan is discharge home, recommend the following:  A little help with walking and/or transfers;Assistance with cooking/housework;Assist for transportation;Help with stairs or ramp for entrance;A little help with bathing/dressing/bathroom   Equipment Recommendations  None recommended by OT    Recommendations for Other Services      Precautions / Restrictions Precautions Precautions: Fall;Other (comment) Precaution Comments: R LE wound vac Restrictions Weight Bearing Restrictions: Yes RLE Weight Bearing: Weight bearing as tolerated       Mobility Bed Mobility Overal bed mobility: Modified Independent                  Transfers Overall transfer level: Needs assistance Equipment used: None Transfers: Sit to/from Stand Sit to Stand: Supervision                 Balance Overall balance assessment: Mild deficits observed, not formally tested                                         ADL either performed or assessed with clinical judgement   ADL Overall ADL's : Needs assistance/impaired                       Lower Body Dressing Details (indicate cue type and reason): verbally  reviewed LB dressing technique, pt not on preveena wound vac yet Toilet Transfer: Contact guard Architect: Tub transfer;Supervision/safety          Extremity/Trunk Assessment Upper Extremity Assessment Upper Extremity Assessment: Overall WFL for tasks assessed   Lower Extremity Assessment Lower Extremity Assessment: Defer to PT evaluation        Vision   Vision Assessment?: No apparent visual deficits   Perception Perception Perception: Not tested   Praxis Praxis Praxis: Not tested    Cognition Arousal: Alert Behavior During Therapy: WFL for tasks assessed/performed Overall Cognitive Status: Within Functional Limits for tasks assessed                                          Exercises      Shoulder Instructions       General Comments VSS    Pertinent Vitals/ Pain       Pain Assessment Pain Assessment: Faces Pain Score: 2  Faces Pain Scale: Hurts a little bit Pain Location: R LE Pain Descriptors / Indicators: Aching, Guarding Pain Intervention(s): Limited activity within patient's tolerance, Monitored during session, Repositioned  Home Living  Prior Functioning/Environment              Frequency  Min 1X/week        Progress Toward Goals  OT Goals(current goals can now be found in the care plan section)  Progress towards OT goals: Progressing toward goals  Acute Rehab OT Goals OT Goal Formulation: With patient Time For Goal Achievement: 11/10/22 Potential to Achieve Goals: Good ADL Goals Pt Will Perform Grooming: with modified independence;standing Pt Will Perform Lower Body Bathing: with modified independence;sit to/from stand Pt Will Perform Lower Body Dressing: with modified independence;sit to/from stand Pt Will Transfer to Toilet: with modified independence;ambulating;regular height toilet Pt Will Perform  Toileting - Clothing Manipulation and hygiene: with modified independence;sit to/from stand;sitting/lateral leans Pt Will Perform Tub/Shower Transfer: Tub transfer;with modified independence;ambulating;shower seat  Plan      Co-evaluation                 AM-PAC OT "6 Clicks" Daily Activity     Outcome Measure   Help from another person eating meals?: None Help from another person taking care of personal grooming?: A Little Help from another person toileting, which includes using toliet, bedpan, or urinal?: A Little Help from another person bathing (including washing, rinsing, drying)?: A Little Help from another person to put on and taking off regular upper body clothing?: A Little Help from another person to put on and taking off regular lower body clothing?: A Little 6 Click Score: 19    End of Session    OT Visit Diagnosis: Pain Pain - Right/Left: Right Pain - part of body: Leg   Activity Tolerance Patient tolerated treatment well   Patient Left in bed;with call bell/phone within reach;with family/visitor present   Nurse Communication Mobility status        Time: 4098-1191 OT Time Calculation (min): 10 min  Charges: OT Treatments $Self Care/Home Management : 8-22 mins  Carver Fila, OTD, OTR/L SecureChat Preferred Acute Rehab [phone]   Carver Fila Koonce 10/30/2022, 1:38 PM

## 2022-10-30 NOTE — Progress Notes (Signed)
Arrived to patient's room and educated patient on the procedure. HOB =/less 45*.  Pt held breath upon line removal. Pressure held for 5 min with no s/sx of bleeding. Instructed to remain in bed for 30 min and keep drsg CDI for 24 hour. And report any s/sx of bleeding applying pressure directly to the site. Pt VU. SO at bedside. Notified nurse. Tomasita Morrow, RN VAST

## 2022-10-30 NOTE — Discharge Instructions (Signed)
Advised to follow-up with primary care physician in 1 week. Advised to take amoxicillin 1000 mg 3 times daily for 12 days to complete 2 weeks treatment postoperatively for coagulase-negative staph bacteremia. Advised to follow-up with Dr. Lajoyce Corners for postoperative follow-up in 1 week. Patient being discharged with wound VAC.

## 2022-10-30 NOTE — Progress Notes (Signed)
Orthopedic Tech Progress Note Patient Details:  Dean Nichols [birth year] [id number] Dropped off crutches per order.  Ortho Devices Type of Ortho Device: Crutches Ortho Device/Splint Location: BLE Ortho Device/Splint Interventions: Ordered, Application, Adjustment   Post Interventions Patient Tolerated: Well Instructions Provided: Adjustment of device, Care of device  [name] 10/30/2022, 2:08 PM

## 2022-10-30 NOTE — TOC Progression Note (Addendum)
Transition of Care [facility]) - Progression Note    Patient Details  Name: Dean Nichols MRN: [id number] Date of Birth: 08-11-1995  Transition of Care [facility]) CM/SW Contact  Janae Bridgeman, RN Phone Number: 10/30/2022, 9:21 AM  Clinical Narrative:    CM called and spoke with Jeri Modena, RNCM with [employer] to follow up regarding needed Home IV antibx.  ID is following the patient.  Patient has Prevena Wound Vac present at the bedside to be sent home with the patient once patient is stable and IV antibiotics have been coordinated and teaching completed.  Pending ID MD to follow for determination of IV antibiotics for home.  10/30/22 1042 - CM called and spoke with Maurine Minister, RNCM with [employer] and updated regarding patient's pending discharge tododay.  Patient has single lumen Right arm PICC line - pending note from ID. Attending MD states that patient has potential discharge for PO antibiotics.  Clinicals were faxed to [facility] at [phone].  I sent note to PT/OT regarding possible need for crutches.  Patient will need for home if OT/PT requests.    10/30/22 1420 - Crutches ordered and delivered to the hospital room by ortho tech.  Patient's PICC line is being removed prior to discharge to home.  Patient is discharging home via family car.  Bedside nursing will provide discharge instructions for home.  Patient is aware that [facility] will follow up tomorrow at the patient's home.  Discharge summary will be provided to the patient, [facility] and [employer].  Patient is aware.  Jeri Modena, RNCM at [facility] is aware that patient does not need IV antibiotics for home.   Expected Discharge Plan: Home/Self Care Barriers to Discharge: Continued Medical Work up  Expected Discharge Plan and Services   Discharge Planning Services: CM Consult   Living arrangements for the past 2 months: Single Family Home                                        Social Determinants of Health (SDOH) Interventions SDOH Screenings   Food Insecurity: No Food Insecurity (10/18/2022)  Housing: Patient Declined (10/18/2022)  Transportation Needs: No Transportation Needs (10/18/2022)  Utilities: Not At Risk (10/18/2022)  Tobacco Use: Low Risk  (10/27/2022)    Readmission Risk Interventions     No data to display

## 2022-10-30 NOTE — Progress Notes (Signed)
Subjective:  No new complaints   Antibiotics:  Anti-infectives (From admission, onward)    Start     Dose/Rate Route Frequency Ordered Stop   10/30/22 1400  amoxicillin (AMOXIL) capsule 1,000 mg        1,000 mg Oral Every 8 hours 10/30/22 1217 11/11/22 0559   10/27/22 0630  ceFAZolin (ANCEF) IVPB 2g/100 mL premix        2 g 200 mL/hr over 30 Minutes Intravenous On call to O.R. 10/27/22 0539 10/27/22 1514   10/25/22 1550  vancomycin (VANCOCIN) powder  Status:  Discontinued          As needed 10/25/22 1551 10/25/22 1612   10/25/22 0600  ceFAZolin (ANCEF) IVPB 2g/100 mL premix        2 g 200 mL/hr over 30 Minutes Intravenous On call to O.R. 10/24/22 1617 10/25/22 1527   10/20/22 2200  linezolid (ZYVOX) tablet 600 mg        600 mg Oral Every 12 hours 10/20/22 1018 10/27/22 2200   10/19/22 1300  penicillin G potassium 12 Million Units in dextrose 5 % 500 mL CONTINUOUS infusion  Status:  Discontinued        12 Million Units 41.7 mL/hr over 12 Hours Intravenous Every 12 hours 10/19/22 1213 10/30/22 1217   10/19/22 1300  linezolid (ZYVOX) IVPB 600 mg  Status:  Discontinued        600 mg 300 mL/hr over 60 Minutes Intravenous Every 12 hours 10/19/22 1213 10/20/22 1018   10/19/22 0600  vancomycin (VANCOCIN) IVPB 1000 mg/200 mL premix  Status:  Discontinued        1,000 mg 200 mL/hr over 60 Minutes Intravenous Every 12 hours 10/18/22 1734 10/19/22 1213   10/19/22 0000  ceFEPIme (MAXIPIME) 2 g in sodium chloride 0.9 % 100 mL IVPB  Status:  Discontinued        2 g 200 mL/hr over 30 Minutes Intravenous Every 8 hours 10/18/22 1734 10/19/22 1213   10/18/22 2130  vancomycin (VANCOCIN) IVPB 1000 mg/200 mL premix  Status:  Discontinued        1,000 mg 200 mL/hr over 60 Minutes Intravenous  Once 10/18/22 2033 10/18/22 2047   10/18/22 1630  ceFEPIme (MAXIPIME) 2 g in sodium chloride 0.9 % 100 mL IVPB        2 g 200 mL/hr over 30 Minutes Intravenous  Once 10/18/22 1616 10/18/22 1820    10/18/22 1630  metroNIDAZOLE (FLAGYL) IVPB 500 mg        500 mg 100 mL/hr over 60 Minutes Intravenous  Once 10/18/22 1616 10/18/22 1819   10/18/22 1630  vancomycin (VANCOCIN) IVPB 1000 mg/200 mL premix        1,000 mg 200 mL/hr over 60 Minutes Intravenous  Once 10/18/22 1616 10/18/22 1924       Medications: Scheduled Meds:  acetaminophen  1,000 mg Oral Q8H   amoxicillin  1,000 mg Oral Q8H   vitamin C  1,000 mg Oral Daily   Chlorhexidine Gluconate Cloth  6 each Topical Daily   docusate sodium  100 mg Oral BID   enoxaparin (LOVENOX) injection  40 mg Subcutaneous Q24H   nutrition supplement (JUVEN)  1 packet Oral BID BM   pantoprazole  40 mg Oral Daily   zinc sulfate  220 mg Oral Daily   Continuous Infusions:  sodium chloride     magnesium sulfate bolus IVPB     methocarbamol (ROBAXIN) IV  PRN Meds:.acetaminophen **FOLLOWED BY** acetaminophen, alum & mag hydroxide-simeth, bisacodyl, bisacodyl, diphenhydrAMINE, guaiFENesin-dextromethorphan, hydrALAZINE, HYDROmorphone (DILAUDID) injection, ibuprofen, labetalol, magnesium citrate, magnesium sulfate bolus IVPB, methocarbamol **OR** methocarbamol (ROBAXIN) IV, metoprolol tartrate, ondansetron, oxyCODONE **OR** oxyCODONE, phenol, polyethylene glycol, potassium chloride, sodium chloride flush    Objective: Weight change:   Intake/Output Summary (Last 24 hours) at 10/30/2022 1329 Last data filed at 10/30/2022 1008 Gross per 24 hour  Intake 1734.49 ml  Output 800 ml  Net 934.49 ml   Blood pressure 117/67, pulse 75, temperature (!) 97.5 F (36.4 C), temperature source Oral, resp. rate 18, height 6' (1.829 m), weight 75.1 kg, SpO2 99%. Temp:  [97.5 F (36.4 C)-98.7 F (37.1 C)] 97.5 F (36.4 C) (10/14 0807) Pulse Rate:  [75-98] 75 (10/14 0807) Resp:  [14-18] 18 (10/14 0807) BP: (113-133)/(65-69) 117/67 (10/14 0807) SpO2:  [98 %-100 %] 99 % (10/14 0807)  Physical Exam: Physical Exam Constitutional:      Appearance: He is  well-developed.  HENT:     Head: Normocephalic and atraumatic.  Eyes:     Conjunctiva/sclera: Conjunctivae normal.  Cardiovascular:     Rate and Rhythm: Normal rate and regular rhythm.  Pulmonary:     Effort: Pulmonary effort is normal. No respiratory distress.     Breath sounds: No wheezing.  Abdominal:     General: There is no distension.     Palpations: Abdomen is soft.  Musculoskeletal:        General: Normal range of motion.     Cervical back: Normal range of motion and neck supple.  Skin:    General: Skin is warm and dry.     Findings: No erythema or rash.  Neurological:     General: No focal deficit present.     Mental Status: He is alert and oriented to person, place, and time.  Psychiatric:        Mood and Affect: Mood normal.        Behavior: Behavior normal.        Thought Content: Thought content normal.        Judgment: Judgment normal.     Right lower extremity wrapped  CBC:    BMET Recent Labs    10/30/22 0321  NA 136  K 3.9  CL 101  CO2 28  GLUCOSE 106*  BUN 22*  CREATININE 1.02  CALCIUM 8.8*     Liver Panel  Recent Labs    10/30/22 0321  PROT 6.6  ALBUMIN 2.8*  AST 25  ALT 53*  ALKPHOS 108  BILITOT 0.4       Sedimentation Rate No results for input(s): "ESRSEDRATE" in the last 72 hours. C-Reactive Protein No results for input(s): "CRP" in the last 72 hours.  Micro Results: Recent Results (from the past 720 hour(s))  Culture, blood (Routine x 2)     Status: Abnormal   Collection Time: 10/18/22  3:26 PM   Specimen: BLOOD LEFT ARM  Result Value Ref Range Status   Specimen Description   Final    BLOOD LEFT ARM Performed at [facility], [address] [place], [place] [address]    Special Requests   Final    BOTTLES DRAWN AEROBIC AND ANAEROBIC Blood Culture adequate volume Performed at [facility], [address], [place], [place] [address]    Culture  Setup Time   Final    GRAM POSITIVE  COCCI IN CHAINS ANAEROBIC BOTTLE ONLY CRITICAL RESULT CALLED TO, READ BACK BY AND VERIFIED  WITH: PHARMD BAILEY A 1153 FCP    Culture (A)  Final    STREPTOCOCCUS PYOGENES HEALTH DEPARTMENT NOTIFIED Performed at [facility], [address], [place], [place] [address]    Report Status 10/21/2022 FINAL  Final   Organism ID, Bacteria STREPTOCOCCUS PYOGENES  Final      Susceptibility   Streptococcus pyogenes - MIC*    PENICILLIN <=0.06 SENSITIVE Sensitive     CEFTRIAXONE <=0.12 SENSITIVE Sensitive     ERYTHROMYCIN <=0.12 SENSITIVE Sensitive     LEVOFLOXACIN 0.5 SENSITIVE Sensitive     VANCOMYCIN 0.25 SENSITIVE Sensitive     * STREPTOCOCCUS PYOGENES  Blood Culture ID Panel (Reflexed)     Status: Abnormal   Collection Time: 10/18/22  3:26 PM  Result Value Ref Range Status   Enterococcus faecalis NOT DETECTED NOT DETECTED Final   Enterococcus Faecium NOT DETECTED NOT DETECTED Final   Listeria monocytogenes NOT DETECTED NOT DETECTED Final   Staphylococcus species NOT DETECTED NOT DETECTED Final   Staphylococcus aureus (BCID) NOT DETECTED NOT DETECTED Final   Staphylococcus epidermidis NOT DETECTED NOT DETECTED Final   Staphylococcus lugdunensis NOT DETECTED NOT DETECTED Final   Streptococcus species DETECTED (A) NOT DETECTED Final    Comment: CRITICAL RESULT CALLED TO, READ BACK BY AND VERIFIED WITH: PHARMD BAILEY A 1153 FCP    Streptococcus agalactiae NOT DETECTED NOT DETECTED Final   Streptococcus pneumoniae NOT DETECTED NOT DETECTED Final   Streptococcus pyogenes DETECTED (A) NOT DETECTED Final    Comment: CRITICAL RESULT CALLED TO, READ BACK BY AND VERIFIED WITH: PHARMD BAILEY A 1153 FCP    A.calcoaceticus-baumannii NOT DETECTED NOT DETECTED Final   Bacteroides fragilis NOT DETECTED NOT DETECTED Final   Enterobacterales NOT DETECTED NOT DETECTED Final   Enterobacter cloacae complex NOT DETECTED NOT DETECTED Final   Escherichia coli NOT DETECTED NOT DETECTED Final    Klebsiella aerogenes NOT DETECTED NOT DETECTED Final   Klebsiella oxytoca NOT DETECTED NOT DETECTED Final   Klebsiella pneumoniae NOT DETECTED NOT DETECTED Final   Proteus species NOT DETECTED NOT DETECTED Final   Salmonella species NOT DETECTED NOT DETECTED Final   Serratia marcescens NOT DETECTED NOT DETECTED Final   Haemophilus influenzae NOT DETECTED NOT DETECTED Final   Neisseria meningitidis NOT DETECTED NOT DETECTED Final   Pseudomonas aeruginosa NOT DETECTED NOT DETECTED Final   Stenotrophomonas maltophilia NOT DETECTED NOT DETECTED Final   Candida albicans NOT DETECTED NOT DETECTED Final   Candida auris NOT DETECTED NOT DETECTED Final   Candida glabrata NOT DETECTED NOT DETECTED Final   Candida krusei NOT DETECTED NOT DETECTED Final   Candida parapsilosis NOT DETECTED NOT DETECTED Final   Candida tropicalis NOT DETECTED NOT DETECTED Final   Cryptococcus neoformans/gattii NOT DETECTED NOT DETECTED Final    Comment: Performed at [facility], [address] [place], [place] [address]  Culture, blood (Routine x 2)     Status: None   Collection Time: 10/18/22  3:31 PM   Specimen: BLOOD  Result Value Ref Range Status   Specimen Description   Final    BLOOD LEFT ANTECUBITAL Performed at [facility], [address] [place], [place] [address]    Special Requests   Final    BOTTLES DRAWN AEROBIC AND ANAEROBIC Blood Culture adequate volume Performed at [facility], [address], [place], [place] [address]    Culture   Final    NO GROWTH 5 DAYS Performed at [facility], [address], [place], [place] [address]    Report Status 10/23/2022 FINAL  Final  Culture, blood (Routine X 2) w Reflex to ID Panel     Status: None   Collection Time: 10/20/22  5:52 AM   Specimen: BLOOD  Result Value Ref Range Status   Specimen Description BLOOD BLOOD RIGHT ARM  Final   Special Requests   Final    BOTTLES DRAWN AEROBIC AND ANAEROBIC  Blood Culture adequate volume   Culture   Final    NO GROWTH 5 DAYS Performed at [facility], [address] [place] [address] [place], [place] [address]    Report Status 10/25/2022 FINAL  Final  Culture, blood (Routine X 2) w Reflex to ID Panel     Status: None   Collection Time: 10/20/22  5:57 AM   Specimen: BLOOD  Result Value Ref Range Status   Specimen Description BLOOD BLOOD RIGHT ARM  Final   Special Requests   Final    BOTTLES DRAWN AEROBIC AND ANAEROBIC Blood Culture adequate volume   Culture   Final    NO GROWTH 5 DAYS Performed at [facility], [address], [place], [place] [address]    Report Status 10/25/2022 FINAL  Final  Aerobic/Anaerobic Culture w Gram Stain (surgical/deep wound)     Status: None   Collection Time: 10/22/22  2:51 PM   Specimen: Soft Tissue, Other  Result Value Ref Range Status   Specimen Description TISSUE  Final   Special Requests right gastroc fascia  Final   Gram Stain NO WBC SEEN NO ORGANISMS SEEN   Final   Culture   Final    No growth aerobically or anaerobically. Performed at [facility], [address], [place], [place] [address]    Report Status 10/27/2022 FINAL  Final  Surgical pcr screen     Status: None   Collection Time: 10/27/22  6:22 AM   Specimen: Nasal Mucosa; Nasal Swab  Result Value Ref Range Status   MRSA, PCR NEGATIVE NEGATIVE Final   Staphylococcus aureus NEGATIVE NEGATIVE Final    Comment: (NOTE) The Xpert SA Assay (FDA approved for NASAL specimens in patients 22 years of age and older), is one component of a comprehensive surveillance program. It is not intended to diagnose infection nor to guide or monitor treatment. Performed at [facility], [address], [place], [place] [address]   Aerobic/Anaerobic Culture w Gram Stain (surgical/deep wound)     Status: None (Preliminary result)   Collection Time: 10/27/22  3:31 PM   Specimen: PATH Soft tissue  Result Value Ref Range Status   Specimen  Description TISSUE RIGHT KNEE  Final   Special Requests PT ON ANCEF  Final   Gram Stain NO WBC SEEN NO ORGANISMS SEEN   Final   Culture   Final    NO GROWTH 3 DAYS NO ANAEROBES ISOLATED; CULTURE IN PROGRESS FOR 5 DAYS Performed at [facility], [address], [place], [place] [address]    Report Status PENDING  Incomplete    Studies/Results: [place] EKG SITE RITE  Result Date: 10/29/2022 If Site Rite image not attached, placement could not be confirmed due to current cardiac rhythm.     Assessment/Plan:  INTERVAL HISTORY: Patient had final surgery on Friday and wound closure.  Principal Problem:   Sepsis due to cellulitis [facility]) Active Problems:   AKI (acute kidney injury) (HCC)   Hyponatremia   Necrotizing fasciitis due to Streptococcus pyogenes (  HCC)   Sepsis due to group A Streptococcus with acute organ dysfunction and septic shock (HCC)    Dean Nichols is a 27 y.o. male with necrotizing fasciitis and group A streptococcal bacteremia, sepsis including shock liver, status post I&D  several times with final closure on Friday.  Zyvox was stopped on Friday and has remained on penicillin I do not think he needs any further IV antibiotics but can go home on high-dose amoxicillin 1 g orally every 8 hours to complete 2 weeks of postoperative antibiotics.  He is can to be following up with Dr. Lajoyce Corners  I have personally spent 50 minutes involved in face-to-face and non-face-to-face activities for this patient on the day of the visit. Professional time spent includes the following activities: Preparing to see the patient (review of tests), Obtaining and/or reviewing separately obtained history (admission/discharge record), Performing a medically appropriate examination and/or evaluation , Ordering medications/tests/procedures, referring and communicating with other health care professionals, Documenting clinical information in the EMR, Independently interpreting results (not  separately reported), Communicating results to the patient/family/caregiver, Counseling and educating the patient/family/caregiver and Care coordination (not separately reported).            LOS: 12 days   Acey Lav 10/30/2022, 1:29 PM

## 2022-10-30 NOTE — Plan of Care (Signed)
  Problem: Education: Goal: Knowledge of General Education information will improve Description: Including pain rating scale, medication(s)/side effects and non-pharmacologic comfort measures Outcome: Progressing   Problem: Health Behavior/Discharge Planning: Goal: Ability to manage health-related needs will improve Outcome: Progressing   Problem: Clinical Measurements: Goal: Ability to maintain clinical measurements within normal limits will improve Outcome: Progressing Goal: Will remain free from infection Outcome: Progressing Goal: Diagnostic test results will improve Outcome: Progressing Goal: Respiratory complications will improve Outcome: Progressing Goal: Cardiovascular complication will be avoided Outcome: Progressing   Problem: Activity: Goal: Risk for activity intolerance will decrease Outcome: Progressing   Problem: Nutrition: Goal: Adequate nutrition will be maintained Outcome: Progressing   Problem: Coping: Goal: Level of anxiety will decrease Outcome: Progressing   Problem: Elimination: Goal: Will not experience complications related to bowel motility Outcome: Progressing Goal: Will not experience complications related to urinary retention Outcome: Progressing   Problem: Pain Managment: Goal: General experience of comfort will improve Outcome: Progressing   Problem: Safety: Goal: Ability to remain free from injury will improve Outcome: Progressing   Problem: Skin Integrity: Goal: Risk for impaired skin integrity will decrease Outcome: Progressing   Problem: Fluid Volume: Goal: Hemodynamic stability will improve Outcome: Progressing   Problem: Clinical Measurements: Goal: Diagnostic test results will improve Outcome: Progressing Goal: Signs and symptoms of infection will decrease Outcome: Progressing   Problem: Respiratory: Goal: Ability to maintain adequate ventilation will improve Outcome: Progressing   Problem: Clinical  Measurements: Goal: Ability to avoid or minimize complications of infection will improve Outcome: Progressing   Problem: Skin Integrity: Goal: Skin integrity will improve Outcome: Progressing   Problem: Clinical Measurements: Goal: Ability to avoid or minimize complications of infection will improve Outcome: Progressing   Problem: Skin Integrity: Goal: Skin integrity will improve Outcome: Progressing   Problem: Education: Goal: Knowledge of the prescribed therapeutic regimen will improve Outcome: Progressing Goal: Ability to verbalize activity precautions or restrictions will improve Outcome: Progressing Goal: Understanding of discharge needs will improve Outcome: Progressing   Problem: Activity: Goal: Ability to perform//tolerate increased activity and mobilize with assistive devices will improve Outcome: Progressing   Problem: Clinical Measurements: Goal: Postoperative complications will be avoided or minimized Outcome: Progressing   Problem: Self-Care: Goal: Ability to meet self-care needs will improve Outcome: Progressing   Problem: Self-Concept: Goal: Ability to maintain and perform role responsibilities to the fullest extent possible will improve Outcome: Progressing   Problem: Pain Management: Goal: Pain level will decrease with appropriate interventions Outcome: Progressing   Problem: Skin Integrity: Goal: Demonstration of wound healing without infection will improve Outcome: Progressing

## 2022-10-30 NOTE — Discharge Summary (Addendum)
Physician Discharge Summary  Dean Nichols:[id number] DOB: 1995/10/25 DOA: 10/18/2022  PCP: Pcp, No  Admit date: 10/18/2022  Discharge date: 10/30/2022  Admitted From: Home  Disposition:  Home  Recommendations for Outpatient Follow-up:  Follow up with PCP in 1-2 weeks. Please obtain BMP/CBC in one week. Advised to take amoxicillin 1000 mg 3 times daily for 12 days to complete 2 weeks treatment postoperatively for Strep pyogens bacteremia. Advised to follow-up with Dr. Lajoyce Corners for postoperative follow-up in 1 week. Patient being discharged with wound VAC.  Home Health:Home PT/RN Equipment/Devices:Wound vac Prevna, crutches,  Discharge Condition: Stable CODE STATUS:Full code Diet recommendation: Heart Healthy   Brief Summary/ Hospital Course: This 27 yrs old male with medical history significant of previous history of ACL tear of the right knee joint, otherwise no significant medical history who was working apparently in Aeronautical engineer.  He was working in the yard when he accidentally cut his leg around the lateral aspect of the right knee with a metal pipe.  He presented to the ED from urgent care with fevers, chills, nausea, vomiting -> sepsis -> was admitted for sepsis due to right lower extremity cellulitis. He is found to have necrotizing fasciitis.  Infectious diseases and orthopedics consulted.  Patient initiated on IV antibiotics.  Patient underwent multiple debridements by orthopedics Dr. Lajoyce Corners.  Subsequently wound has improved and closed.  Patient has wound VAC placed.  Blood cultures grew  Streptococcus pyogenes bacteremia.  Patient initiated on Zyvox and penicillin.  Subsequently Zyvox was discontinued.  Patient is being discharged on amoxicillin 1 g every 8 hours for 12 days to complete 14 days postoperatively.  Home health services arranged.  Patient is being discharged home.   Discharge Diagnoses:  Principal Problem:   Sepsis due to cellulitis [facility]) Active Problems:   AKI  (acute kidney injury) (HCC)   Hyponatremia   Necrotizing fasciitis due to Streptococcus pyogenes (HCC)   Sepsis due to group A Streptococcus with acute organ dysfunction and septic shock (HCC)  Sepsis  / Streptococcus pyogenes bacteremia: Necrotizing fasciitis of right lower extremity Met criteria for sepsis on presentation, continues to meet criteria for sepsis with tachycardia, fever, leukocytosis. He spiked fever on 10/5, leukocytosis is persistent. Blood cultures showed Strep pyogenes in 1/2 bottles. Appreciate ortho recs - s/p R leg excisional debridement, necrotizing fasciitis of thigh and calf  Status post repeat debridement for necrotizing fasciitis.  POD 6 Tissue margins were clear, status post repeat debridement necrotizing fasciitis of right leg. Repeat cultures from 10/4 Ng x 2 Surgical culture 10/6 Neg x 2 days  Penicillin and linezolid (per ID, likely to d/c zyvox after surgery ) ID c/s -> appreciate recs. Echocardiogram EF 55-60%, no RWMA S/p Tdap 10/2 in ED. Patient will be discharged with Prevena plus portable wound VAC pump. PICC line removed.  Patient is being discharged on amoxicillin 1 g every 8 hours for 12 days to complete 14 days treatment.   Left Upper Lip Ulceration: Possibly related to surgery. Resolved.   Elevated LFT's: ? Shock liver RUQ [place] without concerning findings of liver and gallbladder. Acute hepatitis panel  negative Fluctuating, Continue to monitor   Acute kidney Injury: > Resolved. Baseline creatinine unknown Suspect due to sepsis Follow renal US (normal), UA unrevealing   Hypokalemia: Replaced and resolved.   Leukocytosis:  >Improved. Likely secondary to above. Continue antibiotics.  Discharge Instructions  Discharge Instructions     Call MD for:  difficulty breathing, headache or visual disturbances   Complete by:  As directed    Call MD for:  persistant dizziness or light-headedness   Complete by: As directed    Call MD for:   persistant nausea and vomiting   Complete by: As directed    Diet - low sodium heart healthy   Complete by: As directed    Discharge instructions   Complete by: As directed    Advised to follow-up with primary care physician in 1 week. Advised to take amoxicillin 1000 mg 3 times daily for 12 days to complete 2 weeks treatment postoperatively for coagulase-negative staph bacteremia. Advised to follow-up with Dr. Lajoyce Corners for postoperative follow-up in 1 week. Patient being discharged with wound VAC.   Discharge wound care:   Complete by: As directed    Follow up wound care   Increase activity slowly   Complete by: As directed    Negative Pressure Wound Therapy - Incisional   Complete by: As directed    Attached the wound VAC dressing to the Prevena plus portable wound VAC pump at discharge.      Allergies as of 10/30/2022   No Known Allergies      Medication List     TAKE these medications    amoxicillin 500 MG capsule Commonly known as: AMOXIL Take 2 capsules (1,000 mg total) by mouth every 8 (eight) hours for 12 days.   ibuprofen 400 MG tablet Commonly known as: ADVIL Take 1 tablet (400 mg total) by mouth every 6 (six) hours as needed for mild pain (pain score 1-3), fever or headache.   methocarbamol 500 MG tablet Commonly known as: ROBAXIN Take 1 tablet (500 mg total) by mouth every 6 (six) hours as needed for up to 10 days for muscle spasms.   oxyCODONE 5 MG immediate release tablet Commonly known as: Oxy IR/ROXICODONE Take 1 tablet (5 mg total) by mouth every 4 (four) hours as needed for up to 5 days for moderate pain (pain score 4-6).   pantoprazole 40 MG tablet Commonly known as: PROTONIX Take 1 tablet (40 mg total) by mouth daily. Start taking on: October 31, 2022               Durable Medical Equipment  (From admission, onward)           Start     Ordered   10/26/22 1618  For home use only DME lightweight manual wheelchair with seat cushion   Once       Comments: Patient suffers from right lower leg necrotizing fasciitis which impairs their ability to perform daily activities like bathing and dressing in the home.  A crutch or walker will not resolve  issue with performing activities of daily living. A wheelchair will allow patient to safely perform daily activities. Patient is not able to propel themselves in the home using a standard weight wheelchair due to general weakness. Patient can self propel in the lightweight wheelchair. Length of need 6 months . Accessories: elevating leg rests (ELRs), wheel locks, extensions and anti-tippers.   10/26/22 1618   10/26/22 1612  For home use only DME Walker rolling  Once       Question Answer Comment  Walker: With 5 Inch Wheels   Patient needs a walker to treat with the following condition Necrotizing fasciitis of lower leg (HCC)      10/26/22 1618              Discharge Care Instructions  (From admission, onward)  Start     Ordered   10/30/22 0000  Discharge wound care:       Comments: Follow up wound care   10/30/22 1335            Follow-up Information     [facility] [facility] .   Contact information: [address] [place] [address] [phone]        [name], MD Follow up in 1 week(s).   Specialty: Orthopedic Surgery Contact information: [address] [place] [address] [phone]         [facility] Follow up.   Contact information: [phone]        [facility] Follow up.   Why: [facility] will provide IV anitbiotics for home.               No Known Allergies  Consultations: Infectious Diseases Orthopaedics.   Procedures/Studies: [place] EKG SITE RITE  Result Date: 10/29/2022 If Site Rite image not attached, placement could not be confirmed due to current cardiac rhythm.  PERIPHERAL VASCULAR CATHETERIZATION  Result Date: 10/22/2022 See  surgical note for result.  CT FEMUR RIGHT W CONTRAST  Result Date: 10/22/2022 CLINICAL DATA:  Soft tissue infection suspected, thigh. Cellulitis with concern for deep infection. EXAM: CT OF THE LOWER RIGHT EXTREMITY WITH CONTRAST TECHNIQUE: Multidetector CT imaging of the lower right extremity was performed according to the standard protocol following intravenous contrast administration. RADIATION DOSE REDUCTION: This exam was performed according to the departmental dose-optimization program which includes automated exposure control, adjustment of the mA and/or kV according to patient size and/or use of iterative reconstruction technique. CONTRAST:  75mL OMNIPAQUE IOHEXOL 350 MG/ML SOLN COMPARISON:  MRI right knee 10/19/2022, right knee radiographs 10/18/2022 FINDINGS: Bones/Joint/Cartilage Normal bone mineralization. The cortices are intact. No acute fracture or dislocation. Ligaments Suboptimally assessed by CT. Muscles and Tendons Normal density and size of the regional musculature. No gross tendon tear is seen. Soft tissues There is diffuse moderate subcutaneous fat edema and swelling throughout the right thigh, greatest within the posterior and lateral aspects, and greatest specifically at the lateral aspect of the knee where the fluid measures up to approximately 1.7 cm in transverse thickness (axial series 4, image 222 through 2056 and coronal series 8, image 57). No walled-off rim enhancing abscess is seen. No knee joint effusion. IMPRESSION: Diffuse moderate subcutaneous fat edema and swelling throughout the right thigh, greatest within the posterior and lateral aspects, and greatest specifically at the lateral aspect of the knee. No walled-off rim enhancing abscess is seen. No evidence of acute osteomyelitis. Electronically Signed   By: Neita Garnet M.D.   On: 10/22/2022 13:33   CT TIBIA FIBULA RIGHT W CONTRAST  Result Date: 10/22/2022 CLINICAL DATA:  Cellulitis with concern for deep infection.  Soft tissue infection suspected. EXAM: CT OF THE LOWER RIGHT EXTREMITY WITH CONTRAST TECHNIQUE: Multidetector CT imaging of the lower right extremity (right tibia and fibula) was performed according to the standard protocol following intravenous contrast administration. RADIATION DOSE REDUCTION: This exam was performed according to the departmental dose-optimization program which includes automated exposure control, adjustment of the mA and/or kV according to patient size and/or use of iterative reconstruction technique. CONTRAST:  75mL OMNIPAQUE IOHEXOL 350 MG/ML SOLN COMPARISON:  Right knee radiographs 03/13/2020 and 10/18/2022, MRI right knee 10/19/2022 FINDINGS: Bones/Joint/Cartilage Normal bone mineralization. The cortices are intact. No acute fracture or dislocation. Ligaments Suboptimally assessed by CT. Muscles and Tendons Normal size and  density of the regional musculature. No tendon tear is seen. Soft tissues There is moderate subcutaneous fat edema and swelling throughout the visualized right calf and dorsal right foot. This is greatest at the dorsal aspect of the foot, medial and lateral aspects of the ankle, and the posteromedial aspect of the calf. No definite walled-off abscess is visualized, however the pooling fluid in between the subcutaneous fat and the distal medial and lateral heads of the gastrocnemius muscles at the proximal Achilles musculotendinous junction measures up to approximately 1.2 cm in AP thickness and 5.0 cm in transverse dimension (axial series 4, image 127). At the level of the ankle the medial soft tissue swelling measures up to approximately 1.4 cm in the lateral soft tissue swelling measures up to approximately 1.1 cm. There is also lateral right knee subcutaneous fat swelling measuring up to approximately 1.7 cm extending off the superior plane of view. IMPRESSION: 1. Moderate subcutaneous fat edema and swelling throughout the visualized right calf and dorsal right foot.  This is greatest at the dorsal aspect of the foot, medial and lateral aspects of the ankle, and the posteromedial aspect of the calf. No definite walled-off abscess is visualized. 2. No evidence osteomyelitis. Electronically Signed   By: Neita Garnet M.D.   On: 10/22/2022 13:22   US Abdomen Limited RUQ (LIVER/GB)  Result Date: 10/20/2022 CLINICAL DATA:  Elevated LFTs. EXAM: ULTRASOUND ABDOMEN LIMITED RIGHT UPPER QUADRANT COMPARISON:  None Available. FINDINGS: Gallbladder: No gallstones or wall thickening visualized. No sonographic Murphy sign noted by sonographer. The gallbladder is somewhat contracted. Maximal wall thickness is 2.4 mm. Common bile duct: Diameter: 3.6 mm, within normal limits. Liver: No focal lesion identified. Within normal limits in parenchymal echogenicity. Portal vein is patent on color Doppler imaging with normal direction of blood flow towards the liver. Other: The right kidney is incidentally imaged. The parenchyma is hyperechoic relative to the index organ, the liver. No discrete lesions or obstruction are present. IMPRESSION: 1. Normal sonographic appearance of the liver and gallbladder. 2. Hyperechoic appearance of the right kidney. This is a nonspecific finding, but can be seen in the setting of medical renal disease. Electronically Signed   By: Marin Roberts M.D.   On: 10/20/2022 19:03   ECHOCARDIOGRAM COMPLETE  Result Date: 10/20/2022    ECHOCARDIOGRAM REPORT   Patient Name:   TRA WILEMON Date of Exam: 10/19/2022 Medical Rec #:  [id number]     Height:       72.0 in Accession #:    [id number]    Weight:       165.6 lb Date of Birth:  [birth year]      BSA:          1.966 m Patient Age:    27 years      BP:           106/52 mmHg Patient Gender: M             HR:           96 bpm. Exam Location:  Inpatient Procedure: 2D Echo, Cardiac Doppler and Color Doppler Indications:    Bacteremia R78.81  History:        Patient has no prior history of Echocardiogram examinations.   Sonographer:    Lucendia Herrlich RCS Referring Phys: [phone] A CALDWELL POWELL JR IMPRESSIONS  1. Left ventricular ejection fraction, by estimation, is 55 to 60%. The left ventricle has normal function. The left ventricle has no regional wall motion abnormalities.  Left ventricular diastolic parameters were normal.  2. Right ventricular systolic function is normal. The right ventricular size is normal. There is normal pulmonary artery systolic pressure. The estimated right ventricular systolic pressure is 25.1 mmHg.  3. The mitral valve is normal in structure. No evidence of mitral valve regurgitation. No evidence of mitral stenosis.  4. The aortic valve is tricuspid. Aortic valve regurgitation is not visualized. No aortic stenosis is present.  5. The inferior vena cava is dilated in size with >50% respiratory variability, suggesting right atrial pressure of 8 mmHg.  6. No valvular vegetation noted. FINDINGS  Left Ventricle: Left ventricular ejection fraction, by estimation, is 55 to 60%. The left ventricle has normal function. The left ventricle has no regional wall motion abnormalities. The left ventricular internal cavity size was normal in size. There is  no left ventricular hypertrophy. Left ventricular diastolic parameters were normal. Right Ventricle: The right ventricular size is normal. No increase in right ventricular wall thickness. Right ventricular systolic function is normal. There is normal pulmonary artery systolic pressure. The tricuspid regurgitant velocity is 2.07 m/s, and  with an assumed right atrial pressure of 8 mmHg, the estimated right ventricular systolic pressure is 25.1 mmHg. Left Atrium: Left atrial size was normal in size. Right Atrium: Right atrial size was normal in size. Pericardium: Trivial pericardial effusion is present. Mitral Valve: The mitral valve is normal in structure. No evidence of mitral valve regurgitation. No evidence of mitral valve stenosis. Tricuspid Valve: The  tricuspid valve is normal in structure. Tricuspid valve regurgitation is trivial. Aortic Valve: The aortic valve is tricuspid. Aortic valve regurgitation is not visualized. No aortic stenosis is present. Aortic valve peak gradient measures 5.6 mmHg. Pulmonic Valve: The pulmonic valve was normal in structure. Pulmonic valve regurgitation is trivial. Aorta: The aortic root is normal in size and structure. Venous: The inferior vena cava is dilated in size with greater than 50% respiratory variability, suggesting right atrial pressure of 8 mmHg. IAS/Shunts: No atrial level shunt detected by color flow Doppler.  LEFT VENTRICLE PLAX 2D LVIDd:         5.30 cm   Diastology LVIDs:         3.40 cm   LV e' medial:    10.60 cm/s LV PW:         1.00 cm   LV E/e' medial:  8.0 LV IVS:        0.70 cm   LV e' lateral:   16.60 cm/s LVOT diam:     2.40 cm   LV E/e' lateral: 5.1 LV SV:         73 LV SV Index:   37 LVOT Area:     4.52 cm  RIGHT VENTRICLE             IVC RV S prime:     19.20 cm/s  IVC diam: 2.30 cm TAPSE (M-mode): 2.4 cm LEFT ATRIUM             Index        RIGHT ATRIUM           Index LA diam:        3.80 cm 1.93 cm/m   RA Area:     15.50 cm LA Vol (A2C):   57.9 ml 29.45 ml/m  RA Volume:   40.10 ml  20.40 ml/m LA Vol (A4C):   47.5 ml 24.16 ml/m LA Biplane Vol: 54.6 ml 27.77 ml/m  AORTIC VALVE  PULMONIC VALVE AV Area (Vmax): 3.66 cm     PR End Diast Vel: 2.03 msec AV Vmax:        118.00 cm/s AV Peak Grad:   5.6 mmHg LVOT Vmax:      95.50 cm/s LVOT Vmean:     62.400 cm/s LVOT VTI:       0.161 m  AORTA Ao Root diam: 3.30 cm Ao Asc diam:  3.00 cm MITRAL VALVE               TRICUSPID VALVE MV Area (PHT): 4.63 cm    TR Peak grad:   17.1 mmHg MV Decel Time: 164 msec    TR Vmax:        207.00 cm/s MV E velocity: 84.40 cm/s MV A velocity: 51.40 cm/s  SHUNTS MV E/A ratio:  1.64        Systemic VTI:  0.16 m                            Systemic Diam: 2.40 cm Dalton McleanMD Electronically signed by Wilfred Lacy Signature Date/Time: 10/20/2022/8:33:34 AM    Final    US RENAL  Result Date: 10/19/2022 CLINICAL DATA:  Acute kidney injury EXAM: RENAL / URINARY TRACT ULTRASOUND COMPLETE COMPARISON:  None Available. FINDINGS: Right Kidney: Renal measurements: 13 x 4.2 x 5.5 cm = volume: 157 mL. Echogenicity within normal limits. No mass or hydronephrosis visualized. Left Kidney: Renal measurements: 10.6 x 5.9 x 5.3 cm = volume: 174 mL. Echogenicity within normal limits. No mass or hydronephrosis visualized. Bladder: Incompletely distended Other: None. IMPRESSION: Normal renal ultrasound. Electronically Signed   By: Corlis Leak M.D.   On: 10/19/2022 21:18   MR KNEE RIGHT WO CONTRAST  Result Date: 10/19/2022 CLINICAL DATA:  Recent right lateral knee laceration with increasing pain, swelling and erythema. Evaluate for septic arthritis or cellulitis. History of ACL tear. EXAM: MRI OF THE RIGHT KNEE WITHOUT CONTRAST TECHNIQUE: Multiplanar, multisequence MR imaging of the knee was performed. No intravenous contrast was administered. COMPARISON:  Radiographs 10/18/2022 and 03/13/2020. FINDINGS: MENISCI Medial meniscus:  Intact with normal morphology. Lateral meniscus: Possible small radial tear at the junction of the anterior horn and body, best seen on sagittal image 8/5 and coronal image 10/4. No displaced meniscal fragment. LIGAMENTS Cruciates: The anterior cruciate ligament fibers are indistinct, especially distally. There is no edema in the intercondylar notch, and this may relate to the reported previous ACL injury. The PCL is intact. Collaterals: The medial and lateral collateral ligament complexes are intact. CARTILAGE Patellofemoral:  Preserved. Medial:  Preserved. Lateral: There is a chronic impaction injury of the lateral femoral condyle anteriorly with associated overlying chondral thinning, grossly stable from previous radiographs. MISCELLANEOUS Joint:  Small nonspecific knee joint effusion. Popliteal Fossa:  The popliteus muscle and tendon are intact. No significant Baker's cyst. Extensor Mechanism: The visualized quadriceps and patellar tendons are intact. 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 No evidence of acute fracture, dislocation or osteomyelitis. Other: Severe subcutaneous edema surrounding the knee, greatest laterally. Edema extends into the popliteal fossa and Hoffa's fat. No focal fluid collections are identified on noncontrast imaging. No focal muscular abnormalities are seen. IMPRESSION: 1. Severe subcutaneous edema surrounding the knee, greatest laterally. No focal fluid collections identified on noncontrast imaging. In this clinical context, these findings are consistent with cellulitis. The full  extent of this is not imaged on this examination of the knee, and fasciitis and compartment syndrome not excluded. 2. Small nonspecific knee joint effusion. No evidence of osteomyelitis. 3. Chronic impaction fracture of the lateral femoral condyle anteriorly with associated overlying chondral thinning, grossly stable from previous radiographs. No acute osseous findings. 4. Possible small radial tear at the junction of the anterior horn and body of the lateral meniscus. The medial meniscus, collateral ligaments and PCL are intact. 5. Indistinct anterior cruciate ligament fibers, especially distally, likely related to the reported previous ACL injury. No acute ligamentous findings. Electronically Signed   By: Carey Bullocks M.D.   On: 10/19/2022 17:34   US Venous Img Lower Right (DVT Study)  Result Date: 10/18/2022 CLINICAL DATA:  Right knee cellulitis, now with pain and edema. Evaluate for DVT. EXAM: RIGHT LOWER EXTREMITY VENOUS DOPPLER ULTRASOUND TECHNIQUE: Gray-scale sonography with graded compression, as well as color Doppler and duplex ultrasound were performed to evaluate the  lower extremity deep venous systems from the level of the common femoral vein and including the common femoral, femoral, profunda femoral, popliteal and calf veins including the posterior tibial, peroneal and gastrocnemius veins when visible. The superficial great saphenous vein was also interrogated. Spectral Doppler was utilized to evaluate flow at rest and with distal augmentation maneuvers in the common femoral, femoral and popliteal veins. COMPARISON:  None Available. FINDINGS: Common Femoral Vein: No evidence of thrombus. Normal compressibility, respiratory phasicity and response to augmentation. Saphenofemoral Junction: No evidence of thrombus. Normal compressibility and flow on color Doppler imaging. Profunda Femoral Vein: No evidence of thrombus. Normal compressibility and flow on color Doppler imaging. Femoral Vein: No evidence of thrombus. Normal compressibility, respiratory phasicity and response to augmentation. Popliteal Vein: No evidence of thrombus. Normal compressibility, respiratory phasicity and response to augmentation. Calf Veins: No evidence of thrombus. Normal compressibility and flow on color Doppler imaging. Superficial Great Saphenous Vein: No evidence of thrombus. Normal compressibility. Other Findings: Note is made of a mildly prominent though non pathologically enlarged and benign-appearing right inguinal lymph node. The lymph node is not enlarged by size criteria measuring 1 cm in greatest short axis diameter and maintains a benign fatty hilum (image 8), presumably reactive in etiology. IMPRESSION: No evidence of DVT within the right lower extremity. Electronically Signed   By: Simonne Come M.D.   On: 10/18/2022 16:46   DG Chest 2 View  Result Date: 10/18/2022 CLINICAL DATA:  Extremity cellulitis, infection, concern for sepsis EXAM: CHEST - 2 VIEW COMPARISON:  None Available. FINDINGS: The heart size and mediastinal contours are within normal limits. Both lungs are clear. The  visualized skeletal structures are unremarkable. IMPRESSION: No active cardiopulmonary disease. Electronically Signed   By: Judie Petit.  Shick M.D.   On: 10/18/2022 16:19   DG Knee Complete 4 Views Right  Result Date: 10/18/2022 CLINICAL DATA:  Right knee pain, soft tissue swelling, concern for cellulitis EXAM: RIGHT KNEE - COMPLETE 4+ VIEW COMPARISON:  03/13/2020 FINDINGS: No evidence of fracture, dislocation, or joint effusion. No evidence of arthropathy or other focal bone abnormality. Diffuse soft tissue swelling noted more pronounced laterally. IMPRESSION: Soft tissue swelling. No acute osseous finding. Electronically Signed   By: Judie Petit.  Shick M.D.   On: 10/18/2022 16:18     Subjective: Patient was seen and examined at bedside.  Overnight events noted.   Patient reports that he is feeling much better.  Patient had wound VAC,  Patient is able to use crutches.  Patient being discharged home.  Discharge Exam: Vitals:   10/30/22 0500 10/30/22 0807  BP: 113/69 117/67  Pulse: 75 75  Resp: 18 18  Temp: 98.7 F (37.1 C) (!) 97.5 F (36.4 C)  SpO2: 98% 99%   Vitals:   10/29/22 1617 10/29/22 1900 10/30/22 0500 10/30/22 0807  BP: 129/65 133/66 113/69 117/67  Pulse: 76 98 75 75  Resp: 14 18 18 18   Temp: 98.2 F (36.8 C) 98.6 F (37 C) 98.7 F (37.1 C) (!) 97.5 F (36.4 C)  TempSrc: Oral Oral Oral Oral  SpO2: 100% 98% 98% 99%  Weight:      Height:        General: Pt is alert, awake, not in acute distress Cardiovascular: RRR, S1/S2 +, no rubs, no gallops Respiratory: CTA bilaterally, no wheezing, no rhonchi Abdominal: Soft, NT, ND, bowel sounds + Extremities: no edema, no cyanosis    The results of significant diagnostics from this hospitalization (including imaging, microbiology, ancillary and laboratory) are listed below for reference.     Microbiology: Recent Results (from the past 240 hour(s))  Aerobic/Anaerobic Culture w Gram Stain (surgical/deep wound)     Status: None    Collection Time: 10/22/22  2:51 PM   Specimen: Soft Tissue, Other  Result Value Ref Range Status   Specimen Description TISSUE  Final   Special Requests right gastroc fascia  Final   Gram Stain NO WBC SEEN NO ORGANISMS SEEN   Final   Culture   Final    No growth aerobically or anaerobically. Performed at [facility], [address], [place], [place] [address]    Report Status 10/27/2022 FINAL  Final  Surgical pcr screen     Status: None   Collection Time: 10/27/22  6:22 AM   Specimen: Nasal Mucosa; Nasal Swab  Result Value Ref Range Status   MRSA, PCR NEGATIVE NEGATIVE Final   Staphylococcus aureus NEGATIVE NEGATIVE Final    Comment: (NOTE) The Xpert SA Assay (FDA approved for NASAL specimens in patients 56 years of age and older), is one component of a comprehensive surveillance program. It is not intended to diagnose infection nor to guide or monitor treatment. Performed at [facility], [address] [place], [place] [address]   Aerobic/Anaerobic Culture w Gram Stain (surgical/deep wound)     Status: None (Preliminary result)   Collection Time: 10/27/22  3:31 PM   Specimen: PATH Soft tissue  Result Value Ref Range Status   Specimen Description TISSUE RIGHT KNEE  Final   Special Requests PT ON ANCEF  Final   Gram Stain NO WBC SEEN NO ORGANISMS SEEN   Final   Culture   Final    NO GROWTH 3 DAYS NO ANAEROBES ISOLATED; CULTURE IN PROGRESS FOR 5 DAYS Performed at [facility], [address], [place], [place] [address]    Report Status PENDING  Incomplete     Labs: BNP (last 3 results) No results for input(s): "BNP" in the last 8760 hours. Basic Metabolic Panel: Recent Labs  Lab 10/24/22 0522 10/25/22 0531 10/26/22 0805 10/27/22 0751 10/30/22 0321  NA 138  --  135 135 136  K 4.0  --  4.3 4.3 3.9  CL 102  --  98 98 101  CO2 27  --  27 27 28   GLUCOSE 94  --  105* 106* 106*  BUN 15  --  11 18 22*  CREATININE 1.01 0.97 1.02 1.04 1.02   CALCIUM 8.0*  --  8.8*  9.0 8.8*  MG 2.0  --   --  2.1 2.1  PHOS 3.6  --   --  3.2 3.8   Liver Function Tests: Recent Labs  Lab 10/24/22 0522 10/26/22 0805 10/30/22 0321  AST 84* 58* 25  ALT 152* 131* 53*  ALKPHOS 165* 190* 108  BILITOT 0.4 0.6 0.4  PROT 5.3* 6.7 6.6  ALBUMIN 2.1* 2.7* 2.8*   No results for input(s): "LIPASE", "AMYLASE" in the last 168 hours. No results for input(s): "AMMONIA" in the last 168 hours. CBC: Recent Labs  Lab 10/25/22 2158 10/26/22 0805 10/27/22 0751 10/28/22 0525 10/30/22 0321  WBC 29.4* 17.6* 15.4* 18.4* 10.8*  HGB 12.1* 11.0* 10.7* 9.8* 8.6*  HCT 34.9* 31.6* 30.6* 28.0* 24.4*  MCV 87.7 88.3 89.5 89.5 91.7  PLT 447* 447* 465* 514* 481*   Cardiac Enzymes: No results for input(s): "CKTOTAL", "CKMB", "CKMBINDEX", "TROPONINI" in the last 168 hours. BNP: Invalid input(s): "POCBNP" CBG: No results for input(s): "GLUCAP" in the last 168 hours. D-Dimer No results for input(s): "DDIMER" in the last 72 hours. Hgb A1c No results for input(s): "HGBA1C" in the last 72 hours. Lipid Profile No results for input(s): "CHOL", "HDL", "LDLCALC", "TRIG", "CHOLHDL", "LDLDIRECT" in the last 72 hours. Thyroid function studies No results for input(s): "TSH", "T4TOTAL", "T3FREE", "THYROIDAB" in the last 72 hours.  Invalid input(s): "FREET3" Anemia work up No results for input(s): "VITAMINB12", "FOLATE", "FERRITIN", "TIBC", "IRON", "RETICCTPCT" in the last 72 hours. Urinalysis    Component Value Date/Time   COLORURINE YELLOW 10/18/2022 1534   APPEARANCEUR CLEAR 10/18/2022 1534   LABSPEC 1.028 10/18/2022 1534   PHURINE 6.5 10/18/2022 1534   GLUCOSEU NEGATIVE 10/18/2022 1534   HGBUR TRACE (A) 10/18/2022 1534   BILIRUBINUR NEGATIVE 10/18/2022 1534   KETONESUR TRACE (A) 10/18/2022 1534   PROTEINUR 30 (A) 10/18/2022 1534   NITRITE NEGATIVE 10/18/2022 1534   LEUKOCYTESUR TRACE (A) 10/18/2022 1534   Sepsis Labs Recent Labs  Lab 10/26/22 0805  10/27/22 0751 10/28/22 0525 10/30/22 0321  WBC 17.6* 15.4* 18.4* 10.8*   Microbiology Recent Results (from the past 240 hour(s))  Aerobic/Anaerobic Culture w Gram Stain (surgical/deep wound)     Status: None   Collection Time: 10/22/22  2:51 PM   Specimen: Soft Tissue, Other  Result Value Ref Range Status   Specimen Description TISSUE  Final   Special Requests right gastroc fascia  Final   Gram Stain NO WBC SEEN NO ORGANISMS SEEN   Final   Culture   Final    No growth aerobically or anaerobically. Performed at [facility], [address] [place], [place] [address]    Report Status 10/27/2022 FINAL  Final  Surgical pcr screen     Status: None   Collection Time: 10/27/22  6:22 AM   Specimen: Nasal Mucosa; Nasal Swab  Result Value Ref Range Status   MRSA, PCR NEGATIVE NEGATIVE Final   Staphylococcus aureus NEGATIVE NEGATIVE Final    Comment: (NOTE) The Xpert SA Assay (FDA approved for NASAL specimens in patients 22 years of age and older), is one component of a comprehensive surveillance program. It is not intended to diagnose infection nor to guide or monitor treatment. Performed at [facility], [address], [place], [place] [address]   Aerobic/Anaerobic Culture w Gram Stain (surgical/deep wound)     Status: None (Preliminary result)   Collection Time: 10/27/22  3:31 PM   Specimen: PATH Soft tissue  Result Value Ref Range Status  Specimen Description TISSUE RIGHT KNEE  Final   Special Requests PT ON ANCEF  Final   Gram Stain NO WBC SEEN NO ORGANISMS SEEN   Final   Culture   Final    NO GROWTH 3 DAYS NO ANAEROBES ISOLATED; CULTURE IN PROGRESS FOR 5 DAYS Performed at [facility], [address], [place], [place] [address]    Report Status PENDING  Incomplete     Time coordinating discharge: Over 30 minutes  SIGNED:   Willeen Niece, MD  [facility] 10/30/2022, 2:32 PM Pager   If 7PM-7AM, please contact night-coverage

## 2022-11-01 LAB — AEROBIC/ANAEROBIC CULTURE W GRAM STAIN (SURGICAL/DEEP WOUND)
Culture: NO GROWTH
Gram Stain: NONE SEEN

## 2022-11-02 ENCOUNTER — Ambulatory Visit: Payer: Worker's Compensation | Admitting: Orthopedic Surgery

## 2022-11-02 ENCOUNTER — Encounter: Payer: Self-pay | Admitting: Orthopedic Surgery

## 2022-11-02 DIAGNOSIS — M726 Necrotizing fasciitis: Secondary | ICD-10-CM

## 2022-11-02 MED ORDER — OXYCODONE HCL 5 MG PO TABS: 5.0000 mg | ORAL_TABLET | Freq: Four times a day (QID) | ORAL | 0 refills | Status: DC | PRN

## 2022-11-02 NOTE — Anesthesia Postprocedure Evaluation (Signed)
Anesthesia Post Note  Patient: Dean Nichols  Procedure(s) Performed: RIGHT LEG, THIGH DEBRIDMENT AND FASCIOTOMY CLOSURE (Right: Leg Lower) APPLICATION OF KERECIS MICRO 95 SQ CM (Leg Lower) APPLICATION OF WOUND VAC (Right: Leg Lower)     Patient location during evaluation: PACU Anesthesia Type: General Level of consciousness: awake and alert Pain management: pain level controlled Vital Signs Assessment: post-procedure vital signs reviewed and stable Respiratory status: spontaneous breathing, nonlabored ventilation and respiratory function stable Cardiovascular status: blood pressure returned to baseline and stable Postop Assessment: no apparent nausea or vomiting Anesthetic complications: no   There were no known notable events for this encounter.              [name]

## 2022-11-02 NOTE — Progress Notes (Signed)
Office Visit Note   Patient: Dean Nichols           Date of Birth: [birth year]           MRN: [id number] Visit Date: 11/02/2022              Requested by: No referring provider defined for this encounter. PCP: Pcp, No  Chief Complaint  Patient presents with   Right Leg - Routine Post Op    I/09/2022 and 10/27/2022 I&D Right leg       HPI: Patient is a 27 year old gentleman who is seen for follow-up for necrotizing fasciitis right leg.  Patient's most recent debridement was October 11.  The wound VAC is removed today.  Patient has started working with therapy.  Assessment & Plan: Visit Diagnoses:  1. Necrotizing fasciitis (HCC)     Plan: Patient will start with Dial soap cleansing dry dressing changes and compression continue the antibiotics prescription provided for Percocet.  Follow-Up Instructions: Return in about 1 week (around 11/09/2022).   Ortho Exam  Patient is alert, oriented, no adenopathy, well-dressed, normal affect, normal respiratory effort. Examination there is increased drainage beneath the wound VAC dressing with clear serosanguineous drainage.  All the incisions appear healthy and viable however there is some increased redness and swelling distal to the incisions that may be secondary to venous and lymphatic swelling.  We will reevaluate this in 1 week.  Imaging: No results found.     Labs: Lab Results  Component Value Date   HGBA1C 5.0 10/20/2022   ESRSEDRATE 26 (H) 10/18/2022   CRP 28.3 (H) 10/20/2022   CRP 34.2 (H) 10/18/2022   REPTSTATUS 11/01/2022 FINAL 10/27/2022   GRAMSTAIN NO WBC SEEN NO ORGANISMS SEEN  10/27/2022   CULT  10/27/2022    No growth aerobically or anaerobically. Performed at [facility], [address] [place], [place] [address]    [facility] STREPTOCOCCUS PYOGENES 10/18/2022     Lab Results  Component Value Date   ALBUMIN 2.8 (L) 10/30/2022   ALBUMIN 2.7 (L) 10/26/2022   ALBUMIN 2.1 (L) 10/24/2022    Lab  Results  Component Value Date   MG 2.1 10/30/2022   MG 2.1 10/27/2022   MG 2.0 10/24/2022   No results found for: "VD25OH"  No results found for: "PREALBUMIN"    Latest Ref Rng & Units 10/30/2022    3:21 AM 10/28/2022    5:25 AM 10/27/2022    7:51 AM  CBC EXTENDED  WBC 4.0 - 10.5 K/uL 10.8  18.4  15.4   RBC 4.22 - 5.81 MIL/uL 2.66  3.13  3.42   Hemoglobin 13.0 - 17.0 g/dL 8.6  9.8  62.1   HCT 30.8 - 52.0 % 24.4  28.0  30.6   Platelets 150 - 400 K/uL 481  514  465      There is no height or weight on file to calculate BMI.  Orders:  No orders of the defined types were placed in this encounter.  Meds ordered this encounter  Medications   oxyCODONE (OXY IR/ROXICODONE) 5 MG immediate release tablet    Sig: Take 1 tablet (5 mg total) by mouth every 6 (six) hours as needed for moderate pain (pain score 4-6).    Dispense:  20 tablet    Refill:  0     Procedures: No procedures performed  Clinical Data: No additional findings.  ROS:  All other systems negative, except as noted in the HPI. Review  of Systems  Objective: Vital Signs: There were no vitals taken for this visit.  Specialty Comments:  No specialty comments available.  PMFS History: Patient Active Problem List   Diagnosis Date Noted   Necrotizing fasciitis due to Streptococcus pyogenes (HCC) 10/23/2022   Sepsis due to group A Streptococcus with acute organ dysfunction and septic shock (HCC) 10/23/2022   Sepsis due to cellulitis (HCC) 10/18/2022   AKI (acute kidney injury) (HCC) 10/18/2022   Hyponatremia 10/18/2022   Past Medical History:  Diagnosis Date   ACL tear    right    History reviewed. No pertinent family history.  Past Surgical History:  Procedure Laterality Date   APPLICATION OF WOUND VAC Right 10/22/2022   Procedure: APPLICATION OF WOUND VAC;  Surgeon: Teryl Lucy, MD;  Location: [facility] OR;  Service: Orthopedics;  Laterality: Right;   APPLICATION OF WOUND VAC Right 10/27/2022    Procedure: APPLICATION OF WOUND VAC;  Surgeon: [name], MD;  Location: [facility] OR;  Service: Orthopedics;  Laterality: Right;   I & D EXTREMITY Right 10/22/2022   Procedure: IRRIGATION AND DEBRIDEMENT LEG RIGHT;  Surgeon: Teryl Lucy, MD;  Location: [facility] OR;  Service: Orthopedics;  Laterality: Right;   I & D EXTREMITY Right 10/25/2022   Procedure: DEBRIDEMENT RIGHT LEG, THIGH, AND FOOT;  Surgeon: [name], MD;  Location: [facility] OR;  Service: Orthopedics;  Laterality: Right;   I & D EXTREMITY Right 10/27/2022   Procedure: RIGHT LEG, THIGH DEBRIDMENT AND FASCIOTOMY CLOSURE;  Surgeon: [name], MD;  Location: [facility] OR;  Service: Orthopedics;  Laterality: Right;   WISDOM TOOTH EXTRACTION     Social History   Occupational History   Not on file  Tobacco Use   Smoking status: Never   Smokeless tobacco: Never  Vaping Use   Vaping status: Never Used  Substance and Sexual Activity   Alcohol use: Not Currently   Drug use: Yes    Types: Marijuana   Sexual activity: Not on file

## 2022-11-03 ENCOUNTER — Telehealth: Payer: Self-pay

## 2022-11-03 NOTE — Telephone Encounter (Signed)
Pt informed of info below.

## 2022-11-03 NOTE — Telephone Encounter (Signed)
I SW pt, he says he rechecked temp about 20 minutes ago, still in lower 95 range. I asked to make sure he was waiting 15 minutes to check temp after he drinks or eats anything. He said he didn't drink/eat before checking temp. He checked two of his siblings temps and their were normal, so he said it is not thermometer. He said he woke up this morning with sweats. Overall feels okay. Anything to be concerned about?  S/p right leg debridement 10/27/22 from [employer]

## 2022-11-03 NOTE — Telephone Encounter (Signed)
Patient called concerning a low temperature of 95.2.  Patient had right leg surgery on 10/27/2022.  Cb# [phone].  Please advise.  Thank you.

## 2022-11-07 ENCOUNTER — Encounter: Payer: Self-pay | Admitting: Family

## 2022-11-07 ENCOUNTER — Ambulatory Visit (INDEPENDENT_AMBULATORY_CARE_PROVIDER_SITE_OTHER): Payer: Managed Care, Other (non HMO) | Admitting: Family

## 2022-11-07 DIAGNOSIS — B950 Streptococcus, group A, as the cause of diseases classified elsewhere: Secondary | ICD-10-CM

## 2022-11-07 DIAGNOSIS — M726 Necrotizing fasciitis: Secondary | ICD-10-CM

## 2022-11-07 MED ORDER — OXYCODONE HCL 5 MG PO TABS: 5.0000 mg | ORAL_TABLET | Freq: Three times a day (TID) | ORAL | 0 refills | Status: DC | PRN

## 2022-11-07 NOTE — Progress Notes (Signed)
Post-Op Visit Note   Patient: Dean Nichols           Date of Birth: 03/22/1995           MRN: [id number] Visit Date: 11/07/2022 PCP: Pcp, No  Chief Complaint:  Chief Complaint  Patient presents with   Right Leg - Routine Post Op     I0/09/2022 and 10/27/2022 I&D Right leg       HPI:  HPI The patient is a 27 year old gentleman seen in follow-up for necrotizing fasciitis of the right lower leg.  He was most recently debrided on October 11.  Today he is in an Ace wrap with a dry dressing which has been in place since last week  No fever no chills no increased pain  Ortho Exam On examination of the right lower extremity he continues with some clear serosanguineous drainage from the lateral incision the anterior incision is well-healed.  Distal to the lateral incision there is an area of resolving cellulitis with mild erythema warmth and dry peeling skin.  There is no palpable fluctuance  Visit Diagnoses: No diagnosis found.  Plan: Continue daily Dial soap cleansing.  He will follow-up next week for remaining suture removal will set up home delivery of gauze and Ace wrap  Follow-Up Instructions: No follow-ups on file.   Imaging: No results found.  Orders:  No orders of the defined types were placed in this encounter.  Meds ordered this encounter  Medications   oxyCODONE (OXY IR/ROXICODONE) 5 MG immediate release tablet    Sig: Take 1 tablet (5 mg total) by mouth every 8 (eight) hours as needed for moderate pain (pain score 4-6).    Dispense:  21 tablet    Refill:  0     PMFS History: Patient Active Problem List   Diagnosis Date Noted   Necrotizing fasciitis due to Streptococcus pyogenes (HCC) 10/23/2022   Sepsis due to group A Streptococcus with acute organ dysfunction and septic shock (HCC) 10/23/2022   Sepsis due to cellulitis (HCC) 10/18/2022   AKI (acute kidney injury) (HCC) 10/18/2022   Hyponatremia 10/18/2022   Past Medical History:  Diagnosis Date   ACL  tear    right    No family history on file.  Past Surgical History:  Procedure Laterality Date   APPLICATION OF WOUND VAC Right 10/22/2022   Procedure: APPLICATION OF WOUND VAC;  Surgeon: Teryl Lucy, MD;  Location: [facility] OR;  Service: Orthopedics;  Laterality: Right;   APPLICATION OF WOUND VAC Right 10/27/2022   Procedure: APPLICATION OF WOUND VAC;  Surgeon: [name], MD;  Location: [facility] OR;  Service: Orthopedics;  Laterality: Right;   I & D EXTREMITY Right 10/22/2022   Procedure: IRRIGATION AND DEBRIDEMENT LEG RIGHT;  Surgeon: Teryl Lucy, MD;  Location: [facility] OR;  Service: Orthopedics;  Laterality: Right;   I & D EXTREMITY Right 10/25/2022   Procedure: DEBRIDEMENT RIGHT LEG, THIGH, AND FOOT;  Surgeon: [name], MD;  Location: [facility] OR;  Service: Orthopedics;  Laterality: Right;   I & D EXTREMITY Right 10/27/2022   Procedure: RIGHT LEG, THIGH DEBRIDMENT AND FASCIOTOMY CLOSURE;  Surgeon: [name], MD;  Location: [facility] OR;  Service: Orthopedics;  Laterality: Right;   WISDOM TOOTH EXTRACTION     Social History   Occupational History   Not on file  Tobacco Use   Smoking status: Never   Smokeless tobacco: Never  Vaping Use   Vaping status: Never Used  Substance and Sexual  Activity   Alcohol use: Not Currently   Drug use: Yes    Types: Marijuana   Sexual activity: Not on file

## 2022-11-15 ENCOUNTER — Telehealth (INDEPENDENT_AMBULATORY_CARE_PROVIDER_SITE_OTHER): Payer: Self-pay | Admitting: Primary Care

## 2022-11-15 NOTE — Telephone Encounter (Signed)
Called to remind pt about upcoming apt.

## 2022-11-16 ENCOUNTER — Inpatient Hospital Stay (INDEPENDENT_AMBULATORY_CARE_PROVIDER_SITE_OTHER): Payer: Managed Care, Other (non HMO) | Admitting: Primary Care

## 2022-11-16 ENCOUNTER — Telehealth (INDEPENDENT_AMBULATORY_CARE_PROVIDER_SITE_OTHER): Payer: Self-pay

## 2022-11-16 NOTE — Telephone Encounter (Signed)
Copied from CRM [id number]. Topic: Appointment Scheduling - Scheduling Inquiry for Clinic >> Nov 16, 2022 10:23 AM Clide Dales wrote: Patient is scheduled for appointment at 1030 today but is stuck in traffic. Next available Hospital f/u is November 14th. Can patient be worked in later today or sometime this week? Please advise.

## 2022-11-17 ENCOUNTER — Telehealth: Payer: Self-pay | Admitting: Family

## 2022-11-17 ENCOUNTER — Ambulatory Visit (INDEPENDENT_AMBULATORY_CARE_PROVIDER_SITE_OTHER): Payer: Managed Care, Other (non HMO) | Admitting: Family

## 2022-11-17 ENCOUNTER — Encounter: Payer: Self-pay | Admitting: Family

## 2022-11-17 DIAGNOSIS — M25661 Stiffness of right knee, not elsewhere classified: Secondary | ICD-10-CM

## 2022-11-17 DIAGNOSIS — R29898 Other symptoms and signs involving the musculoskeletal system: Secondary | ICD-10-CM

## 2022-11-17 DIAGNOSIS — Z419 Encounter for procedure for purposes other than remedying health state, unspecified: Secondary | ICD-10-CM | POA: Diagnosis not present

## 2022-11-17 DIAGNOSIS — M726 Necrotizing fasciitis: Secondary | ICD-10-CM

## 2022-11-17 DIAGNOSIS — B950 Streptococcus, group A, as the cause of diseases classified elsewhere: Secondary | ICD-10-CM

## 2022-11-17 MED ORDER — OXYCODONE HCL 5 MG PO TABS: 5.0000 mg | ORAL_TABLET | Freq: Two times a day (BID) | ORAL | 0 refills | Status: DC | PRN

## 2022-11-17 MED ORDER — OXYCODONE HCL 5 MG PO TABS: 5.0000 mg | ORAL_TABLET | Freq: Two times a day (BID) | ORAL | 0 refills | Status: AC | PRN

## 2022-11-17 NOTE — Telephone Encounter (Signed)
Pt called in regards to checking on the status of medication being sent to the pharmacy dicussed at appt today

## 2022-11-17 NOTE — Addendum Note (Signed)
Addended by: Barnie Del R on: 11/17/2022 03:18 PM   Modules accepted: Orders

## 2022-11-17 NOTE — Progress Notes (Signed)
   Post-Op Visit Note   Patient: Dean Nichols           Date of Birth: [birth year]           MRN: [id number] Visit Date: 11/17/2022 PCP: Pcp, No  Chief Complaint:  Chief Complaint  Patient presents with   Right Leg - Routine Post Op    0/09/2022 and 10/27/2022 I&D Right leg     HPI:  HPI This patient is a 27 year old gentleman seen status post repeat debridements of the right leg for necrotizing fasciitis Ortho Exam On examination of the right leg the posterior incision is nearly fully healed proximally has gaped about 2 mm sutures left in place.  The lateral incision is healing well also.  The patient has about 3 cm in length laterally that has gaped about 2 mm this is also left with sutures in place no erythema warmth or drainage  Visit Diagnoses: No diagnosis found.  Plan: Continue with daily dose of cleansing.  Dry dressings.  Progressive ambulation.  Will order PT.  Follow-Up Instructions: No follow-ups on file.   Imaging: No results found.  Orders:  No orders of the defined types were placed in this encounter.  No orders of the defined types were placed in this encounter.    PMFS History: Patient Active Problem List   Diagnosis Date Noted   Necrotizing fasciitis due to Streptococcus pyogenes (HCC) 10/23/2022   Sepsis due to group A Streptococcus with acute organ dysfunction and septic shock (HCC) 10/23/2022   Sepsis due to cellulitis (HCC) 10/18/2022   AKI (acute kidney injury) (HCC) 10/18/2022   Hyponatremia 10/18/2022   Past Medical History:  Diagnosis Date   ACL tear    right    No family history on file.  Past Surgical History:  Procedure Laterality Date   APPLICATION OF WOUND VAC Right 10/22/2022   Procedure: APPLICATION OF WOUND VAC;  Surgeon: Teryl Lucy, MD;  Location: [facility] OR;  Service: Orthopedics;  Laterality: Right;   APPLICATION OF WOUND VAC Right 10/27/2022   Procedure: APPLICATION OF WOUND VAC;  Surgeon: [name], MD;  Location: [facility]  OR;  Service: Orthopedics;  Laterality: Right;   I & D EXTREMITY Right 10/22/2022   Procedure: IRRIGATION AND DEBRIDEMENT LEG RIGHT;  Surgeon: Teryl Lucy, MD;  Location: [facility] OR;  Service: Orthopedics;  Laterality: Right;   I & D EXTREMITY Right 10/25/2022   Procedure: DEBRIDEMENT RIGHT LEG, THIGH, AND FOOT;  Surgeon: [name], MD;  Location: [facility] OR;  Service: Orthopedics;  Laterality: Right;   I & D EXTREMITY Right 10/27/2022   Procedure: RIGHT LEG, THIGH DEBRIDMENT AND FASCIOTOMY CLOSURE;  Surgeon: [name], MD;  Location: [facility] OR;  Service: Orthopedics;  Laterality: Right;   WISDOM TOOTH EXTRACTION     Social History   Occupational History   Not on file  Tobacco Use   Smoking status: Never   Smokeless tobacco: Never  Vaping Use   Vaping status: Never Used  Substance and Sexual Activity   Alcohol use: Not Currently   Drug use: Yes    Types: Marijuana   Sexual activity: Not on file

## 2022-11-17 NOTE — Telephone Encounter (Signed)
Were you going to call med in for this pt today?

## 2022-11-17 NOTE — Telephone Encounter (Signed)
done

## 2022-11-17 NOTE — Telephone Encounter (Signed)
Pt needs oxycodone sent to [facility] in [place] on [address].

## 2022-11-21 DIAGNOSIS — R29898 Other symptoms and signs involving the musculoskeletal system: Secondary | ICD-10-CM | POA: Insufficient documentation

## 2022-11-23 ENCOUNTER — Encounter (INDEPENDENT_AMBULATORY_CARE_PROVIDER_SITE_OTHER): Payer: Self-pay | Admitting: Primary Care

## 2022-11-23 ENCOUNTER — Ambulatory Visit (INDEPENDENT_AMBULATORY_CARE_PROVIDER_SITE_OTHER): Payer: Managed Care, Other (non HMO) | Admitting: Primary Care

## 2022-11-23 VITALS — BP 109/73 | HR 64 | Resp 16 | Ht 72.0 in | Wt 154.4 lb

## 2022-11-23 DIAGNOSIS — L0390 Cellulitis, unspecified: Secondary | ICD-10-CM

## 2022-11-23 DIAGNOSIS — A419 Sepsis, unspecified organism: Secondary | ICD-10-CM

## 2022-11-23 DIAGNOSIS — Z28310 Unvaccinated for covid-19: Secondary | ICD-10-CM

## 2022-11-23 DIAGNOSIS — Z09 Encounter for follow-up examination after completed treatment for conditions other than malignant neoplasm: Secondary | ICD-10-CM

## 2022-11-23 DIAGNOSIS — Z7689 Persons encountering health services in other specified circumstances: Secondary | ICD-10-CM

## 2022-11-23 DIAGNOSIS — Z2821 Immunization not carried out because of patient refusal: Secondary | ICD-10-CM | POA: Diagnosis not present

## 2022-11-23 NOTE — Therapy (Deleted)
OUTPATIENT PHYSICAL THERAPY LOWER EXTREMITY EVALUATION   Patient Name: Dean Nichols MRN: [id number] DOB:07/31/1995, 27 y.o., male Today's Date: 11/23/2022  END OF SESSION:   Past Medical History:  Diagnosis Date   ACL tear    right   Past Surgical History:  Procedure Laterality Date   APPLICATION OF WOUND VAC Right 10/22/2022   Procedure: APPLICATION OF WOUND VAC;  Surgeon: Teryl Lucy, MD;  Location: [facility] OR;  Service: Orthopedics;  Laterality: Right;   APPLICATION OF WOUND VAC Right 10/27/2022   Procedure: APPLICATION OF WOUND VAC;  Surgeon: [name], MD;  Location: [facility] OR;  Service: Orthopedics;  Laterality: Right;   I & D EXTREMITY Right 10/22/2022   Procedure: IRRIGATION AND DEBRIDEMENT LEG RIGHT;  Surgeon: Teryl Lucy, MD;  Location: [facility];  Service: Orthopedics;  Laterality: Right;   I & D EXTREMITY Right 10/25/2022   Procedure: DEBRIDEMENT RIGHT LEG, THIGH, AND FOOT;  Surgeon: [name], MD;  Location: [facility];  Service: Orthopedics;  Laterality: Right;   I & D EXTREMITY Right 10/27/2022   Procedure: RIGHT LEG, THIGH DEBRIDMENT AND FASCIOTOMY CLOSURE;  Surgeon: [name], MD;  Location: [facility] OR;  Service: Orthopedics;  Laterality: Right;   WISDOM TOOTH EXTRACTION     Patient Active Problem List   Diagnosis Date Noted   Weakness of right leg 11/21/2022   Necrotizing fasciitis due to Streptococcus pyogenes (HCC) 10/23/2022   Sepsis due to group A Streptococcus with acute organ dysfunction and septic shock (HCC) 10/23/2022   Sepsis due to cellulitis (HCC) 10/18/2022   AKI (acute kidney injury) (HCC) 10/18/2022   Hyponatremia 10/18/2022    PCP: Grayce Sessions, NP   REFERRING PROVIDER: Adonis Huguenin, NP   REFERRING DIAG: RLE weakness, knee stiffness   THERAPY DIAG:  No diagnosis found.  Rationale for Evaluation and Treatment: Rehabilitation  ONSET DATE: 11/17/22  SUBJECTIVE:   SUBJECTIVE STATEMENT: ***  PERTINENT HISTORY: Per PT notes  from recent hospitalization: 27 yo male admitted 10/2 with RLE pain, edema with sepsis and cellulits after cutting leg on metal 9/30. Pt with necrotizing fasciitis s/p debridement of Rt calf and thigh 10/6 & 10/9, 10/11. PMhx; Rt ACL tear  PAIN:  Are you having pain? {OPRCPAIN:27236}  PRECAUTIONS: {Therapy precautions:24002}  RED FLAGS: None   WEIGHT BEARING RESTRICTIONS: Yes WBAT RLE  FALLS:  Has patient fallen in last 6 months? {fallsyesno:27318}  LIVING ENVIRONMENT: Lives with: {OPRC lives with:25569::"lives with their family"} Lives in: {Lives in:25570} Stairs: {opstairs:27293} Has following equipment at home: {Assistive devices:23999}  OCCUPATION: ***  PLOF: {PLOF:24004}  PATIENT GOALS: ***  NEXT MD VISIT: ***  OBJECTIVE:  Note: Objective measures were completed at Evaluation unless otherwise noted.  DIAGNOSTIC FINDINGS: ***  PATIENT SURVEYS:  {rehab surveys:24030}  COGNITION: Overall cognitive status: {cognition:24006}     SENSATION: {sensation:27233}  EDEMA:  {edema:24020}  MUSCLE LENGTH: Hamstrings: Right *** deg; Left *** deg Maisie Fus test: Right *** deg; Left *** deg  POSTURE: {posture:25561}  PALPATION: ***  LOWER EXTREMITY ROM:  {AROM/PROM:27142} ROM Right eval Left eval  Hip flexion    Hip extension    Hip abduction    Hip adduction    Hip internal rotation    Hip external rotation    Knee flexion    Knee extension    Ankle dorsiflexion    Ankle plantarflexion    Ankle inversion    Ankle eversion     (Blank rows = not tested)  LOWER EXTREMITY  MMT:  MMT Right eval Left eval  Hip flexion    Hip extension    Hip abduction    Hip adduction    Hip internal rotation    Hip external rotation    Knee flexion    Knee extension    Ankle dorsiflexion    Ankle plantarflexion    Ankle inversion    Ankle eversion     (Blank rows = not tested)  LOWER EXTREMITY SPECIAL TESTS:  {LEspecialtests:26242}  FUNCTIONAL TESTS:   {Functional tests:24029}  GAIT: Distance walked: *** Assistive device utilized: {Assistive devices:23999} Level of assistance: {Levels of assistance:24026} Comments: ***   TODAY'S TREATMENT:                                                                                                                              DATE:  11/27/22 Education    PATIENT EDUCATION:  Education details: POC Person educated: Patient Education method: Explanation Education comprehension: verbalized understanding  HOME EXERCISE PROGRAM: ***  ASSESSMENT:  CLINICAL IMPRESSION: Patient is a 27 y.o. who was seen today for physical therapy evaluation and treatment for S/P necrotizing fasciitis in RLE with multiple subsequent surgeries and resulting weakness and stiffness.***.   OBJECTIVE IMPAIRMENTS: Abnormal gait, decreased activity tolerance, decreased balance, decreased coordination, decreased mobility, difficulty walking, decreased ROM, decreased strength, impaired flexibility, improper body mechanics, postural dysfunction, and pain.   ACTIVITY LIMITATIONS: bending, sitting, squatting, stairs, transfers, and locomotion level  PARTICIPATION LIMITATIONS: meal prep, cleaning, laundry, driving, shopping, community activity, and occupation  PERSONAL FACTORS: Past/current experiences are also affecting patient's functional outcome.   REHAB POTENTIAL: Good  CLINICAL DECISION MAKING: Stable/uncomplicated  EVALUATION COMPLEXITY: Moderate   GOALS: Goals reviewed with patient? Yes  SHORT TERM GOALS: Target date: 12/16/22 I with basic HEP Baseline: Goal status: INITIAL  2.  *** Baseline:  Goal status: INITIAL  3.  *** Baseline:  Goal status: INITIAL  4.  *** Baseline:  Goal status: INITIAL  5.  *** Baseline:  Goal status: INITIAL  6.  *** Baseline:  Goal status: INITIAL  LONG TERM GOALS: Target date: ***  I with final HEP Baseline:  Goal status: INITIAL  2.  Improve RLE ROM  to equal to L Baseline:  Goal status: INITIAL  3.  Increase RLE strength to at least 4/5 Baseline:  Goal status: INITIAL  4.  Patient will score at least 24 on FGA Baseline:  Goal status: INITIAL  5.  Patient will ambulate at least 500' with LRAD, minimized gait deviations including equal step length, equal stance time, equal WB. Baseline:  Goal status: INITIAL  6.  Up and down at least 12 steps using U rail, MI, step over step Baseline:  Goal status: INITIAL   PLAN:  PT FREQUENCY: 2x/week  PT DURATION: 10 weeks  PLANNED INTERVENTIONS: 97110-Therapeutic exercises, 97530- Therapeutic activity, 97112- Neuromuscular re-education, 97535- Self Care, 16109- Manual therapy, [id number]- Gait training, 97014- Electrical stimulation (unattended), 97016- Vasopneumatic  device, [phone]- Ionotophoresis 4mg /ml Dexamethasone, Patient/Family education, Balance training, Stair training, Dry Needling, Joint mobilization, Cryotherapy, and Moist heat  PLAN FOR NEXT SESSION: ***   Iona Beard, DPT 11/23/2022, 4:02 PM

## 2022-11-23 NOTE — Progress Notes (Signed)
[facility]   Subjective:   Dean Nichols is a 27 y.o. male presents for hospital follow up and establish care. Admit date to the hospital was 10/18/22, patient was discharged from the hospital on 10/30/22, patient was admitted for: Sepsis due to cellulitis . Hospital f/u completed by orthopedic surgeon Dr. Lajoyce Nichols. Sexually active heterosexual male uses condoms. D/W s/s of infection. Denies shortness of breath, headaches, chest pain or lower extremity edema, sudden onset, vision changes, unilateral weakness, dizziness, paresthesias . Concern with drainage spoke with Dr. Lajoyce Nichols he looked at the site just needs to clean with soap and water (antibacteria)change guaze has appt in AM.  Injury occurred at [employer] -metal rod in a box hit his knee- ignored it at the time area only bleeding. Later he develop pain and swelling. 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 A incident report should have been reported/competed time of injury other co workers witness the incident. This is the first time he reveal the truth.  Past Medical History:  Diagnosis Date   ACL tear    right     No Known Allergies    Current Outpatient Medications on File Prior to Visit  Medication Sig Dispense Refill   oxyCODONE (OXY IR/ROXICODONE) 5 MG immediate release tablet Take 1 tablet (5 mg total) by mouth every 12 (twelve) hours as needed for moderate pain (pain score 4-6). 21 tablet 0   ibuprofen (ADVIL) 400 MG tablet Take 1 tablet (400 mg total) by mouth every 6 (six) hours as needed for mild pain (pain score 1-3), fever or headache. (Patient not taking: Reported on 11/23/2022) 30 tablet 0   pantoprazole (PROTONIX) 40 MG tablet Take 1 tablet (40 mg total) by mouth daily. (Patient not taking: Reported on 11/23/2022) 30 tablet 0   No current facility-administered medications on file prior to visit.     Review of System: Comprehensive ROS Pertinent positive and negative noted in  HPI    Objective:  BP 109/73   Pulse 64   Resp 16   Ht 6' (1.829 m)   Wt 154 lb 6.4 oz (70 kg)   SpO2 99%   BMI 20.94 kg/m   Filed Weights   11/23/22 1031  Weight: 154 lb 6.4 oz (70 kg)    Physical Exam: General Appearance: Well nourished, in no apparent distress. Eyes: PERRLA, EOMs, conjunctiva no swelling or erythema Sinuses: No Frontal/maxillary tenderness ENT/Mouth: Ext aud canals clear, TMs without erythema, bulging. No erythema, swelling, or exudate on post pharynx.  Tonsils not swollen or erythematous. Hearing normal.  Neck: Supple, thyroid normal.  Respiratory: Respiratory effort normal, BS equal bilaterally without rales, rhonchi, wheezing or stridor.  Cardio: RRR with no MRGs. Brisk peripheral pulses without edema.  Abdomen: Soft, + BS.  Non tender, no guarding, rebound, hernias, masses. Lymphatics: Non tender without lymphadenopathy.  Musculoskeletal: Full ROM, 5/5 strength, normal gait.  Skin: see pictures Neuro: Cranial nerves intact. Normal muscle tone, no cerebellar symptoms. Sensation intact.  Psych: Awake and oriented X 3, normal affect, Insight and Judgment appropriate.    Assessment:  Dean Nichols was seen today for hospitalization follow-up.  Diagnoses and all orders for this visit:  Encounter to establish care  Influenza vaccination declined  COVID-19 vaccine series declined  Hospital discharge follow-up See HPI  Sepsis due to cellulitis [facility])       This note has been created with Education officer, environmental. Any  transcriptional errors are unintentional.   Dean Sessions, NP 11/23/2022, 10:49 AM

## 2022-11-24 ENCOUNTER — Ambulatory Visit (INDEPENDENT_AMBULATORY_CARE_PROVIDER_SITE_OTHER): Payer: Managed Care, Other (non HMO) | Admitting: Family

## 2022-11-24 ENCOUNTER — Encounter: Payer: Self-pay | Admitting: Family

## 2022-11-24 DIAGNOSIS — B950 Streptococcus, group A, as the cause of diseases classified elsewhere: Secondary | ICD-10-CM

## 2022-11-24 DIAGNOSIS — M726 Necrotizing fasciitis: Secondary | ICD-10-CM

## 2022-11-24 LAB — BASIC METABOLIC PANEL
BUN/Creatinine Ratio: 10 (ref 9–20)
BUN: 10 mg/dL (ref 6–20)
CO2: 27 mmol/L (ref 20–29)
Calcium: 9.7 mg/dL (ref 8.7–10.2)
Chloride: 99 mmol/L (ref 96–106)
Creatinine, Ser: 1.01 mg/dL (ref 0.76–1.27)
Glucose: 82 mg/dL (ref 70–99)
Potassium: 4.1 mmol/L (ref 3.5–5.2)
Sodium: 141 mmol/L (ref 134–144)
eGFR: 104 mL/min/{1.73_m2} (ref >59)

## 2022-11-24 LAB — CBC WITH DIFFERENTIAL/PLATELET
Basophils Absolute: 0.1 10*3/uL (ref 0.0–0.2)
Basos: 2 %
EOS (ABSOLUTE): 0.2 10*3/uL (ref 0.0–0.4)
Eos: 3 %
Hematocrit: 42.2 % (ref 37.5–51.0)
Hemoglobin: 13.7 g/dL (ref 13.0–17.7)
Immature Grans (Abs): 0 10*3/uL (ref 0.0–0.1)
Immature Granulocytes: 0 %
Lymphocytes Absolute: 2.3 10*3/uL (ref 0.7–3.1)
Lymphs: 39 %
MCH: 31.7 pg (ref 26.6–33.0)
MCHC: 32.5 g/dL (ref 31.5–35.7)
MCV: 98 fL — ABNORMAL HIGH (ref 79–97)
Monocytes Absolute: 0.6 10*3/uL (ref 0.1–0.9)
Monocytes: 10 %
Neutrophils Absolute: 2.8 10*3/uL (ref 1.4–7.0)
Neutrophils: 46 %
Platelets: 321 10*3/uL (ref 150–450)
RBC: 4.32 x10E6/uL (ref 4.14–5.80)
RDW: 13.5 % (ref 11.6–15.4)
WBC: 5.9 10*3/uL (ref 3.4–10.8)

## 2022-11-24 NOTE — Progress Notes (Signed)
   Post-Op Visit Note   Patient: Dean Nichols           Date of Birth: [birth year]           MRN: [id number] Visit Date: 11/24/2022 PCP: Grayce Sessions, NP  Chief Complaint:  Chief Complaint  Patient presents with   Right Leg - Follow-up   Right Knee - Follow-up    HPI:  HPI The patient is a 27 year old gentleman who is seen status post repeat debridements of the right leg he has currently been doing dry dressing changes has a few remaining sutures he complains of just aching pain but overall his pain is getting much better.  He is set up to begin physical therapy next week.  Has also had hospitalization follow-up with primary care expects to be out of work for several months.  Ortho Exam On examination of the right leg the posterior knee incisions are well-healed sutures harvested without incident there is no gaping drainage no erythema laterally the incision has puckered a length of 3 cm there are 2 mm of depth there is granulation in the wound bed there is no active drainage or surrounding erythema Visit Diagnoses: No diagnosis found.  Plan: Continue with daily Dial soap cleansing dry dressings proceed with physical therapy he will follow-up in 3 weeks  Follow-Up Instructions: No follow-ups on file.   Imaging: No results found.  Orders:  No orders of the defined types were placed in this encounter.  No orders of the defined types were placed in this encounter.    PMFS History: Patient Active Problem List   Diagnosis Date Noted   Weakness of right leg 11/21/2022   Necrotizing fasciitis due to Streptococcus pyogenes (HCC) 10/23/2022   Sepsis due to group A Streptococcus with acute organ dysfunction and septic shock (HCC) 10/23/2022   Sepsis due to cellulitis (HCC) 10/18/2022   AKI (acute kidney injury) (HCC) 10/18/2022   Hyponatremia 10/18/2022   Past Medical History:  Diagnosis Date   ACL tear    right    No family history on file.  Past Surgical History:   Procedure Laterality Date   APPLICATION OF WOUND VAC Right 10/22/2022   Procedure: APPLICATION OF WOUND VAC;  Surgeon: Teryl Lucy, MD;  Location: [facility] OR;  Service: Orthopedics;  Laterality: Right;   APPLICATION OF WOUND VAC Right 10/27/2022   Procedure: APPLICATION OF WOUND VAC;  Surgeon: [name], MD;  Location: [facility] OR;  Service: Orthopedics;  Laterality: Right;   I & D EXTREMITY Right 10/22/2022   Procedure: IRRIGATION AND DEBRIDEMENT LEG RIGHT;  Surgeon: Teryl Lucy, MD;  Location: [facility] OR;  Service: Orthopedics;  Laterality: Right;   I & D EXTREMITY Right 10/25/2022   Procedure: DEBRIDEMENT RIGHT LEG, THIGH, AND FOOT;  Surgeon: [name], MD;  Location: [facility] OR;  Service: Orthopedics;  Laterality: Right;   I & D EXTREMITY Right 10/27/2022   Procedure: RIGHT LEG, THIGH DEBRIDMENT AND FASCIOTOMY CLOSURE;  Surgeon: [name], MD;  Location: [facility] OR;  Service: Orthopedics;  Laterality: Right;   WISDOM TOOTH EXTRACTION     Social History   Occupational History   Not on file  Tobacco Use   Smoking status: Never   Smokeless tobacco: Never  Vaping Use   Vaping status: Never Used  Substance and Sexual Activity   Alcohol use: Not Currently   Drug use: Yes    Types: Marijuana   Sexual activity: Not on file

## 2022-11-27 ENCOUNTER — Ambulatory Visit: Payer: Managed Care, Other (non HMO) | Admitting: Physical Therapy

## 2022-12-01 ENCOUNTER — Telehealth (INDEPENDENT_AMBULATORY_CARE_PROVIDER_SITE_OTHER): Payer: Self-pay | Admitting: Primary Care

## 2022-12-01 NOTE — Telephone Encounter (Signed)
Called pt to see about moving apt time bc provider will not be in office at that time. Pt rescheduled for 12/6.

## 2022-12-07 ENCOUNTER — Encounter (INDEPENDENT_AMBULATORY_CARE_PROVIDER_SITE_OTHER): Payer: Self-pay | Admitting: Primary Care

## 2022-12-07 ENCOUNTER — Ambulatory Visit (INDEPENDENT_AMBULATORY_CARE_PROVIDER_SITE_OTHER): Payer: Managed Care, Other (non HMO) | Admitting: Primary Care

## 2022-12-07 VITALS — BP 112/72 | HR 72 | Temp 97.8°F | Resp 16 | Ht 72.0 in | Wt 158.0 lb

## 2022-12-07 DIAGNOSIS — A419 Sepsis, unspecified organism: Secondary | ICD-10-CM

## 2022-12-07 DIAGNOSIS — L84 Corns and callosities: Secondary | ICD-10-CM

## 2022-12-07 DIAGNOSIS — L0390 Cellulitis, unspecified: Secondary | ICD-10-CM

## 2022-12-07 NOTE — Progress Notes (Signed)
Left foot pain Has callus on sole of left foot

## 2022-12-07 NOTE — Progress Notes (Signed)
Established Patient Office Visit  Subjective   Patient ID: PATICK LANGELL    DOB: [birth year]  Age: 27 y.o. MRN: [id number]  Foot Pain    Estle L Burke is a 27 year old male who presents for an acute visit he complains of pain on the left bottom of his foot increase with walking.  Rates his pain-no pain at rest pain is 8 out of 10 when walking and touching and palpation. This is the only concern he presents with.  Active Ambulatory Problems    Diagnosis Date Noted   Sepsis due to cellulitis (HCC) 10/18/2022   AKI (acute kidney injury) (HCC) 10/18/2022   Hyponatremia 10/18/2022   Necrotizing fasciitis due to Streptococcus pyogenes (HCC) 10/23/2022   Sepsis due to group A Streptococcus with acute organ dysfunction and septic shock (HCC) 10/23/2022   Weakness of right leg 11/21/2022   Resolved Ambulatory Problems    Diagnosis Date Noted   No Resolved Ambulatory Problems   Past Medical History:  Diagnosis Date   ACL tear      ROS  Comprehensive ROS Pertinent positive and negative noted in HPI     Objective:   BP 112/72 (BP Location: Left Arm, Patient Position: Sitting, Cuff Size: Normal)   Pulse 72   Temp 97.8 F (36.6 C) (Oral)   Resp 16   Ht 6' (1.829 m)   Wt 158 lb (71.7 kg)   SpO2 100%   BMI 21.43 kg/m     Physical Exam Constitutional:      Appearance: Normal appearance.  HENT:     Head: Normocephalic.  Cardiovascular:     Rate and Rhythm: Normal rate and regular rhythm.  Pulmonary:     Effort: Pulmonary effort is normal.     Breath sounds: Normal breath sounds.  Abdominal:     General: Abdomen is flat. Bowel sounds are normal.     Palpations: Abdomen is soft.  Musculoskeletal:        General: Normal range of motion.     Cervical back: Normal range of motion.  Skin:    Comments: See picture callus left foot  Neurological:     Mental Status: He is alert and oriented to person, place, and time.  Psychiatric:        Mood and Affect: Mood normal.         Behavior: Behavior normal.     No results found for any visits on 11/24/21.  The ASCVD Risk score (Arnett DK, et al., 2019) failed to calculate for the following reasons:   The systolic blood pressure is missing   Cannot find a previous HDL lab   Cannot find a previous total cholesterol lab    Assessment & Plan:  Jakai was seen today for foot pain.  Diagnoses and all orders for this visit:  Callus  -     Ambulatory referral to Podiatry   Sepsis due to cellulitis [facility]) 2/2 Necrotizing fasciitis due to Streptococcus pyogenes  Follow-up by orthopedics Dr. Lajoyce Corners seen nurse practitioner on 11/24/2022.  Patient stated job was concerned when he would be returning to work.  Patient has not started physical therapy which was recommended by Ortho and he has a incision has puckered a length of 3 cm there are 2 mm of depth there is granulation in the wound bed there is no active drainage or surrounding erythema . During visit contacted Dr. Lajoyce Corners he is not ready to be released back to work and will return  to work when his treatment is complete and safe to return.  Ortho will provide him with a return to work note at that time.     Grayce Sessions, NP

## 2022-12-17 DIAGNOSIS — Z419 Encounter for procedure for purposes other than remedying health state, unspecified: Secondary | ICD-10-CM | POA: Diagnosis not present

## 2022-12-20 ENCOUNTER — Encounter (INDEPENDENT_AMBULATORY_CARE_PROVIDER_SITE_OTHER): Payer: Managed Care, Other (non HMO) | Admitting: Primary Care

## 2022-12-22 ENCOUNTER — Encounter (INDEPENDENT_AMBULATORY_CARE_PROVIDER_SITE_OTHER): Payer: Managed Care, Other (non HMO) | Admitting: Primary Care

## 2022-12-22 ENCOUNTER — Encounter: Payer: Self-pay | Admitting: Family

## 2022-12-22 ENCOUNTER — Ambulatory Visit (INDEPENDENT_AMBULATORY_CARE_PROVIDER_SITE_OTHER): Payer: Worker's Compensation | Admitting: Family

## 2022-12-22 DIAGNOSIS — B950 Streptococcus, group A, as the cause of diseases classified elsewhere: Secondary | ICD-10-CM

## 2022-12-22 DIAGNOSIS — M25661 Stiffness of right knee, not elsewhere classified: Secondary | ICD-10-CM

## 2022-12-22 DIAGNOSIS — R29898 Other symptoms and signs involving the musculoskeletal system: Secondary | ICD-10-CM

## 2022-12-22 DIAGNOSIS — M726 Necrotizing fasciitis: Secondary | ICD-10-CM

## 2022-12-22 NOTE — Progress Notes (Signed)
Office Visit Note   Patient: Dean Nichols           Date of Birth: [birth year]           MRN: [id number] Visit Date: 12/22/2022              Requested by: Grayce Sessions, NP [address] [place],  [place] [address] PCP: Grayce Sessions, NP  Chief Complaint  Patient presents with   Left Foot - Wound Check   Right Leg - Follow-up      HPI: The patient is a 27 year old gentleman who is seen in follow-up for debridement of right thigh and leg.  His incisions are all well-healed he does continue to complain of some tightness.  Reports lack of range of motion cannot fully flex his knee.  He also reports some locking and grinding of his kneecap.  He feels the kneecap is moving about.  Assessment & Plan: Visit Diagnoses: No diagnosis found.  Plan: Recommended VMO strengthening.  Physical therapy for strengthening range of motion of the right lower extremity.  He will follow-up in 6 weeks after physical therapy.  Follow-Up Instructions: No follow-ups on file.   Ortho Exam  Patient is alert, oriented, no adenopathy, well-dressed, normal affect, normal respiratory effort. On examination right leg his incisions are well-healed he has full extension does have flexion to 100 degrees.  No edema no erythema.  The patella does not track midline.  Suspect some VMO weakness.  Imaging: No results found.    Labs: Lab Results  Component Value Date   HGBA1C 5.0 10/20/2022   ESRSEDRATE 26 (H) 10/18/2022   CRP 28.3 (H) 10/20/2022   CRP 34.2 (H) 10/18/2022   REPTSTATUS 11/01/2022 FINAL 10/27/2022   GRAMSTAIN NO WBC SEEN NO ORGANISMS SEEN  10/27/2022   CULT  10/27/2022    No growth aerobically or anaerobically. Performed at [facility], [address] [place], [place] [address]    [facility] STREPTOCOCCUS PYOGENES 10/18/2022     Lab Results  Component Value Date   ALBUMIN 2.8 (L) 10/30/2022   ALBUMIN 2.7 (L) 10/26/2022   ALBUMIN 2.1 (L) 10/24/2022    Lab Results   Component Value Date   MG 2.1 10/30/2022   MG 2.1 10/27/2022   MG 2.0 10/24/2022   No results found for: "VD25OH"  No results found for: "PREALBUMIN"    Latest Ref Rng & Units 11/23/2022   11:32 AM 10/30/2022    3:21 AM 10/28/2022    5:25 AM  CBC EXTENDED  WBC 3.4 - 10.8 x10E3/uL 5.9  10.8  18.4   RBC 4.14 - 5.80 x10E6/uL 4.32  2.66  3.13   Hemoglobin 13.0 - 17.7 g/dL 82.9  8.6  9.8   HCT 56.2 - 51.0 % 42.2  24.4  28.0   Platelets 150 - 450 x10E3/uL 321  481  514   NEUT# 1.4 - 7.0 x10E3/uL 2.8     Lymph# 0.7 - 3.1 x10E3/uL 2.3        There is no height or weight on file to calculate BMI.  Orders:  No orders of the defined types were placed in this encounter.  No orders of the defined types were placed in this encounter.    Procedures: No procedures performed  Clinical Data: No additional findings.  ROS:  All other systems negative, except as noted in the HPI. Review of Systems  Objective: Vital Signs: There were no vitals taken for this visit.  Specialty Comments:  No specialty comments available.  PMFS History: Patient Active Problem List   Diagnosis Date Noted   Weakness of right leg 11/21/2022   Necrotizing fasciitis due to Streptococcus pyogenes (HCC) 10/23/2022   Sepsis due to group A Streptococcus with acute organ dysfunction and septic shock (HCC) 10/23/2022   Sepsis due to cellulitis (HCC) 10/18/2022   AKI (acute kidney injury) (HCC) 10/18/2022   Hyponatremia 10/18/2022   Past Medical History:  Diagnosis Date   ACL tear    right    No family history on file.  Past Surgical History:  Procedure Laterality Date   APPLICATION OF WOUND VAC Right 10/22/2022   Procedure: APPLICATION OF WOUND VAC;  Surgeon: Teryl Lucy, MD;  [facility]: [facility];  Service: Orthopedics;  Laterality: Right;   APPLICATION OF WOUND VAC Right 10/27/2022   Procedure: APPLICATION OF WOUND VAC;  Surgeon: [name], MD;  Location: [facility] OR;  Service: Orthopedics;   Laterality: Right;   I & D EXTREMITY Right 10/22/2022   Procedure: IRRIGATION AND DEBRIDEMENT LEG RIGHT;  Surgeon: Teryl Lucy, MD;  Location: [facility] OR;  Service: Orthopedics;  Laterality: Right;   I & D EXTREMITY Right 10/25/2022   Procedure: DEBRIDEMENT RIGHT LEG, THIGH, AND FOOT;  Surgeon: [name], MD;  Location: [facility] OR;  Service: Orthopedics;  Laterality: Right;   I & D EXTREMITY Right 10/27/2022   Procedure: RIGHT LEG, THIGH DEBRIDMENT AND FASCIOTOMY CLOSURE;  Surgeon: [name], MD;  Location: [facility] OR;  Service: Orthopedics;  Laterality: Right;   WISDOM TOOTH EXTRACTION     Social History   Occupational History   Not on file  Tobacco Use   Smoking status: Never   Smokeless tobacco: Never  Vaping Use   Vaping status: Never Used  Substance and Sexual Activity   Alcohol use: Not Currently   Drug use: Yes    Types: Marijuana   Sexual activity: Not on file

## 2022-12-25 ENCOUNTER — Ambulatory Visit: Payer: Worker's Compensation | Admitting: Podiatry

## 2022-12-28 ENCOUNTER — Ambulatory Visit (INDEPENDENT_AMBULATORY_CARE_PROVIDER_SITE_OTHER): Payer: Self-pay

## 2022-12-28 NOTE — Telephone Encounter (Signed)
Message from Glenn L sent at 12/28/2022 10:02 AM EST  Summary: Vomiting 3 days   Pt states he has been vomiting for the past 3 days. Pt states symptoms started Sunday night. Pt states he has the vomiting, headache and upset stomach.  Pt seeking clinical advice.          Chief Complaint: no sx today last vomited Tuesday night Symptoms: none Frequency: started on Monday Pertinent Negatives: Patient denies any sx - vomiting resolved, diarrhea Disposition: [] ED /[] Urgent Care (no appt availability in office) / [] Appointment(In office/virtual)/ []  [facility]/ [x] Home Care/ [] Refused Recommended Disposition /[]  Mobile Bus/ []  Follow-up with PCP Additional Notes:   Reason for Disposition  MILD or MODERATE vomiting (e.g., 1 - 5 times / day)  Answer Assessment - Initial Assessment Questions 1. VOMITING SEVERITY: "How many times have you vomited in the past 24 hours?"     - MILD:  1 - 2 times/day    - MODERATE: 3 - 5 times/day, decreased oral intake without significant weight loss or symptoms of dehydration    - SEVERE: 6 or more times/day, vomits everything or nearly everything, with significant weight loss, symptoms of dehydration      none 2. ONSET: "When did the vomiting begin?"      Monday  3. FLUIDS: "What fluids or food have you vomited up today?" "Have you been able to keep any fluids down?"     Able to eat and drink  4. ABDOMEN PAIN: "Are your having any abdomen pain?" If Yes : "How bad is it and what does it feel like?" (e.g., crampy, dull, intermittent, constant)      No 5. DIARRHEA: "Is there any diarrhea?" If Yes, ask: "How many times today?"      no  8. HYDRATION STATUS: "Any signs of dehydration?" (e.g., dry mouth [not only dry lips], too weak to stand) "When did you last urinate?"     no 9. OTHER SYMPTOMS: "Do you have any other symptoms?" (e.g., fever, headache, vertigo, vomiting blood or coffee grounds, recent head injury)     no  Protocols  used: Vomiting-A-AH

## 2023-01-03 ENCOUNTER — Ambulatory Visit (INDEPENDENT_AMBULATORY_CARE_PROVIDER_SITE_OTHER): Payer: Commercial Managed Care - HMO | Admitting: Primary Care

## 2023-01-03 ENCOUNTER — Ambulatory Visit (INDEPENDENT_AMBULATORY_CARE_PROVIDER_SITE_OTHER): Payer: Commercial Managed Care - HMO | Admitting: Podiatry

## 2023-01-03 ENCOUNTER — Encounter: Payer: Self-pay | Admitting: Podiatry

## 2023-01-03 VITALS — BP 100/65 | HR 96 | Resp 16 | Ht 72.0 in | Wt 157.2 lb

## 2023-01-03 DIAGNOSIS — B070 Plantar wart: Secondary | ICD-10-CM

## 2023-01-03 DIAGNOSIS — Z0000 Encounter for general adult medical examination without abnormal findings: Secondary | ICD-10-CM

## 2023-01-03 NOTE — Progress Notes (Signed)
[facility]  Dean Nichols is a 27 y.o. male presents to office today for annual physical exam examination.    Concerns today include: 1. No- pain continues right knee   Occupation: [employer], Marital status: S, Substance use: S Diet: Regular, Exercise: 2 times a week due to leg injury unable to exercise daily as before  Health Maintenance  Topic Date Due   COVID-19 Vaccine (1 - 2024-25 season) Never done   INFLUENZA VACCINE  04/16/2023 (Originally 08/17/2022)   DTaP/Tdap/Td (2 - Td or Tdap) 10/17/2032   Hepatitis C Screening  Completed   HIV Screening  Completed   HPV VACCINES  Aged Out     Past Medical History:  Diagnosis Date   ACL tear    right   Social History   Socioeconomic History   Marital status: Single    Spouse name: Not on file   Number of children: Not on file   Years of education: Not on file   Highest education level: Not on file  Occupational History   Not on file  Tobacco Use   Smoking status: Never   Smokeless tobacco: Never  Vaping Use   Vaping status: Never Used  Substance and Sexual Activity   Alcohol use: Not Currently   Drug use: Yes    Types: Marijuana   Sexual activity: Not on file  Other Topics Concern   Not on file  Social History Narrative   Not on file   Social Drivers of Health   Financial Resource Strain: Low Risk  (11/23/2022)   Overall Financial Resource Strain (CARDIA)    Difficulty of Paying Living Expenses: Not very hard  Food Insecurity: No Food Insecurity (11/23/2022)   Hunger Vital Sign    Worried About Running Out of Food in the Last Year: Never true    Ran Out of Food in the Last Year: Never true  Transportation Needs: No Transportation Needs (11/23/2022)   PRAPARE - Administrator, Civil Service (Medical): No    Lack of Transportation (Non-Medical): No  Physical Activity: Sufficiently Active (11/23/2022)   Exercise Vital Sign    Days of Exercise per Week: 5 days    Minutes of Exercise per  Session: 60 min  Stress: No Stress Concern Present (11/23/2022)   [employer] of Occupational Health - Occupational Stress Questionnaire    Feeling of Stress : Not at all  Social Connections: Moderately Integrated (11/23/2022)   Social Connection and Isolation Panel [NHANES]    Frequency of Communication with Friends and Family: Three times a week    Frequency of Social Gatherings with Friends and Family: Once a week    Attends Religious Services: 1 to 4 times per year    Active Member of [employer] or Organizations: Yes    Attends Banker Meetings: More than 4 times per year    Marital Status: Never married  Intimate Partner Violence: Not At Risk (11/23/2022)   Humiliation, Afraid, Rape, and Kick questionnaire    Fear of Current or Ex-Partner: No    Emotionally Abused: No    Physically Abused: No    Sexually Abused: No   Past Surgical History:  Procedure Laterality Date   APPLICATION OF WOUND VAC Right 10/22/2022   Procedure: APPLICATION OF WOUND VAC;  Surgeon: Teryl Lucy, MD;  Location: [facility];  Service: Orthopedics;  Laterality: Right;   APPLICATION OF WOUND VAC Right 10/27/2022   Procedure: APPLICATION OF WOUND VAC;  Surgeon: Aldean Baker  V, MD;  Location: [facility] OR;  Service: Orthopedics;  Laterality: Right;   I & D EXTREMITY Right 10/22/2022   Procedure: IRRIGATION AND DEBRIDEMENT LEG RIGHT;  Surgeon: Teryl Lucy, MD;  Location: [facility] OR;  Service: Orthopedics;  Laterality: Right;   I & D EXTREMITY Right 10/25/2022   Procedure: DEBRIDEMENT RIGHT LEG, THIGH, AND FOOT;  Surgeon: [name], MD;  Location: [facility] OR;  Service: Orthopedics;  Laterality: Right;   I & D EXTREMITY Right 10/27/2022   Procedure: RIGHT LEG, THIGH DEBRIDMENT AND FASCIOTOMY CLOSURE;  Surgeon: [name], MD;  Location: [facility];  Service: Orthopedics;  Laterality: Right;   WISDOM TOOTH EXTRACTION     No family history on file.  Current Outpatient Medications:    ibuprofen (ADVIL) 400 MG tablet,  Take 1 tablet (400 mg total) by mouth every 6 (six) hours as needed for mild pain (pain score 1-3), fever or headache., Disp: 30 tablet, Rfl: 0   oxyCODONE (OXY IR/ROXICODONE) 5 MG immediate release tablet, Take 1 tablet (5 mg total) by mouth every 12 (twelve) hours as needed for moderate pain (pain score 4-6)., Disp: 21 tablet, Rfl: 0 Outpatient Encounter Medications as of 01/03/2023  Medication Sig   ibuprofen (ADVIL) 400 MG tablet Take 1 tablet (400 mg total) by mouth every 6 (six) hours as needed for mild pain (pain score 1-3), fever or headache.   oxyCODONE (OXY IR/ROXICODONE) 5 MG immediate release tablet Take 1 tablet (5 mg total) by mouth every 12 (twelve) hours as needed for moderate pain (pain score 4-6).   No facility-administered encounter medications on file as of 01/03/2023.    No Known Allergies   ROS: Review of Systems Comprehensive ROS Pertinent positive and negative noted in HPI    Physical exam Physical exam: General: Vital signs reviewed.  Patient is well-developed and well-nourished, Body mass index is 21.32 kg/m.  in no acute distress and cooperative with exam. Head: Normocephalic and atraumatic. Eyes: EOMI, conjunctivae normal, no scleral icterus. Neck: Supple, trachea midline, normal ROM, no JVD, masses, thyromegaly, or carotid bruit present. Cardiovascular: RRR, S1 normal, S2 normal, no murmurs, gallops, or rubs. Pulmonary/Chest: Clear to auscultation bilaterally, no wheezes, rales, or rhonchi. Abdominal: Soft, non-tender, non-distended, BS +, no masses, organomegaly, or guarding present. Musculoskeletal: No joint deformities, erythema, or stiffness, ROM full and nontender. Extremities: No lower extremity edema bilaterally,  pulses symmetric and intact bilaterally. No cyanosis or clubbing. Neurological: A&O x3, Strength is normal Skin: Warm, dry and intact. No rashes or erythema. Psychiatric: Normal mood and affect. speech and behavior is normal. Cognition and  memory are normal.      Assessment/ Plan: Dean Nichols here for annual physical exam.   No problem-specific Assessment & Plan notes found for this encounter.   Counseled on healthy lifestyle choices, including diet (rich in fruits, vegetables and lean meats and low in salt and simple carbohydrates) and exercise (at least 30 minutes of moderate physical activity daily).  Patient to follow up in 1 year for annual exam or sooner if needed.  The above assessment and management plan was discussed with the patient. The patient verbalized understanding of and has agreed to the management plan. Patient is aware to call the clinic if symptoms persist or worsen. Patient is aware when to return to the clinic for a follow-up visit. Patient educated on when it is appropriate to go to the emergency department.   This note has been created with Teaching laboratory technician and  smart Lobbyist. Any transcriptional errors are unintentional.   Grayce Sessions, NP 01/03/2023, 11:11 AM

## 2023-01-03 NOTE — Progress Notes (Signed)
   Chief Complaint  Patient presents with   Callouses    RM#6 Left foot callus painful to walk X 1 month.    Subjective: 27 y.o. male presenting today as a new patient for evaluation of a symptomatic lesion to the plantar aspect of the left foot.  Recent history of sepsis and flesh eating bacteria secondary to an injury while working at [employer] to the right leg.  He was in the hospital for about 6 weeks.  When he returned home from the hospital within a few weeks he had noticed a skin lesion developing to the plantar aspect of the left forefoot.  It is slowly become larger and very symptomatic with walking.   Past Medical History:  Diagnosis Date   ACL tear    right    Objective: Physical Exam General: The patient is alert and oriented x3 in no acute distress.   Dermatology: Hyperkeratotic skin lesion(s) noted to the plantar aspect of the left foot approximately 1 cm in diameter. Pinpoint bleeding noted upon debridement. Skin is warm, dry and supple bilateral lower extremities. Negative for open lesions or macerations.   Vascular: Palpable pedal pulses bilaterally. No edema or erythema noted. Capillary refill within normal limits.   Neurological: Grossly intact via light touch   Musculoskeletal Exam: Pain on palpation to the noted skin lesion(s).  Range of motion within normal limits to all pedal and ankle joints bilateral. Muscle strength 5/5 in all groups bilateral.    Assessment: #1 plantar wart left foot   Plan of Care:  -Patient was evaluated. -Excisional debridement of the plantar wart lesion(s) was performed using a chisel blade. Cantharone was applied and the lesion(s) was dressed with a dry sterile dressing. -Return to clinic 3 weeks  Felecia Shelling, DPM [facility]  Dr. Felecia Shelling, DPM    [address]                                        [place], [place] [address]                Office [phone]  Fax [phone]

## 2023-01-17 DIAGNOSIS — Z419 Encounter for procedure for purposes other than remedying health state, unspecified: Secondary | ICD-10-CM | POA: Diagnosis not present

## 2023-01-29 ENCOUNTER — Ambulatory Visit (INDEPENDENT_AMBULATORY_CARE_PROVIDER_SITE_OTHER): Payer: Commercial Managed Care - HMO | Admitting: Orthopedic Surgery

## 2023-01-29 ENCOUNTER — Encounter: Payer: Self-pay | Admitting: Orthopedic Surgery

## 2023-01-29 DIAGNOSIS — B950 Streptococcus, group A, as the cause of diseases classified elsewhere: Secondary | ICD-10-CM

## 2023-01-29 DIAGNOSIS — M726 Necrotizing fasciitis: Secondary | ICD-10-CM

## 2023-01-29 NOTE — Progress Notes (Signed)
 Office Visit Note   Patient: Dean Nichols           Date of Birth: Jun 04, 1995           MRN: [id number] Visit Date: 01/29/2023              Requested by: Celestia Rosaline SQUIBB, NP [address] [place],  [place] [address] PCP: Celestia Rosaline SQUIBB, NP  Chief Complaint  Patient presents with   Right Leg - Follow-up   Left Foot - Follow-up      HPI: Patient is a 28 year old gentleman who is seen in follow-up for debridement necrotizing fasciitis right leg.  Assessment & Plan: Visit Diagnoses:  1. Necrotizing fasciitis due to Streptococcus pyogenes (HCC)     Plan: The incisions are well-healed.  Recommended quad strengthening.  Follow-Up Instructions: Return if symptoms worsen or fail to improve.   Ortho Exam  Patient is alert, oriented, no adenopathy, well-dressed, normal affect, normal respiratory effort. Examination the incisions are well-healed there is no redness no cellulitis no drainage.  Patient notes he has a wart on the left foot that is being followed by podiatry.  Imaging: No results found.   Labs: Lab Results  Component Value Date   HGBA1C 5.0 10/20/2022   ESRSEDRATE 26 (H) 10/18/2022   CRP 28.3 (H) 10/20/2022   CRP 34.2 (H) 10/18/2022   REPTSTATUS 11/01/2022 FINAL 10/27/2022   GRAMSTAIN NO WBC SEEN NO ORGANISMS SEEN  10/27/2022   CULT  10/27/2022    No growth aerobically or anaerobically. Performed at [facility], [address] [place], [place] [address]    [facility] STREPTOCOCCUS PYOGENES 10/18/2022     Lab Results  Component Value Date   ALBUMIN 2.8 (L) 10/30/2022   ALBUMIN 2.7 (L) 10/26/2022   ALBUMIN 2.1 (L) 10/24/2022    Lab Results  Component Value Date   MG 2.1 10/30/2022   MG 2.1 10/27/2022   MG 2.0 10/24/2022   No results found for: VD25OH  No results found for: PREALBUMIN    Latest Ref Rng & Units 11/23/2022   11:32 AM 10/30/2022    3:21 AM 10/28/2022    5:25 AM  CBC EXTENDED  WBC 3.4 - 10.8 x10E3/uL 5.9   10.8  18.4   RBC 4.14 - 5.80 x10E6/uL 4.32  2.66  3.13   Hemoglobin 13.0 - 17.7 g/dL 86.2  8.6  9.8   HCT 62.4 - 51.0 % 42.2  24.4  28.0   Platelets 150 - 450 x10E3/uL 321  481  514   NEUT# 1.4 - 7.0 x10E3/uL 2.8     Lymph# 0.7 - 3.1 x10E3/uL 2.3        There is no height or weight on file to calculate BMI.  Orders:  No orders of the defined types were placed in this encounter.  No orders of the defined types were placed in this encounter.    Procedures: No procedures performed  Clinical Data: No additional findings.  ROS:  All other systems negative, except as noted in the HPI. Review of Systems  Objective: Vital Signs: There were no vitals taken for this visit.  Specialty Comments:  No specialty comments available.  PMFS History: Patient Active Problem List   Diagnosis Date Noted   Weakness of right leg 11/21/2022   Necrotizing fasciitis due to Streptococcus pyogenes (HCC) 10/23/2022   Sepsis due to group A Streptococcus with acute organ dysfunction and septic shock (HCC) 10/23/2022   Sepsis due to cellulitis (  HCC) 10/18/2022   AKI (acute kidney injury) (HCC) 10/18/2022   Hyponatremia 10/18/2022   Past Medical History:  Diagnosis Date   ACL tear    right    History reviewed. No pertinent family history.  Past Surgical History:  Procedure Laterality Date   APPLICATION OF WOUND VAC Right 10/22/2022   Procedure: APPLICATION OF WOUND VAC;  Surgeon: Josefina Chew, MD;  Location: [facility];  Service: Orthopedics;  Laterality: Right;   APPLICATION OF WOUND VAC Right 10/27/2022   Procedure: APPLICATION OF WOUND VAC;  Surgeon: Harden Jerona GAILS, MD;  Location: [facility] OR;  Service: Orthopedics;  Laterality: Right;   I & D EXTREMITY Right 10/22/2022   Procedure: IRRIGATION AND DEBRIDEMENT LEG RIGHT;  Surgeon: Josefina Chew, MD;  Location: [facility] OR;  Service: Orthopedics;  Laterality: Right;   I & D EXTREMITY Right 10/25/2022   Procedure: DEBRIDEMENT RIGHT LEG, THIGH, AND FOOT;   Surgeon: Harden Jerona GAILS, MD;  Location: [facility] OR;  Service: Orthopedics;  Laterality: Right;   I & D EXTREMITY Right 10/27/2022   Procedure: RIGHT LEG, THIGH DEBRIDMENT AND FASCIOTOMY CLOSURE;  Surgeon: Harden Jerona GAILS, MD;  Location: [facility] OR;  Service: Orthopedics;  Laterality: Right;   WISDOM TOOTH EXTRACTION     Social History   Occupational History   Not on file  Tobacco Use   Smoking status: Never   Smokeless tobacco: Never  Vaping Use   Vaping status: Never Used  Substance and Sexual Activity   Alcohol use: Not Currently   Drug use: Yes    Types: Marijuana   Sexual activity: Not on file

## 2023-01-31 ENCOUNTER — Encounter: Payer: Self-pay | Admitting: Podiatry

## 2023-01-31 ENCOUNTER — Ambulatory Visit (INDEPENDENT_AMBULATORY_CARE_PROVIDER_SITE_OTHER): Payer: Worker's Comp, Other unspecified | Admitting: Podiatry

## 2023-01-31 DIAGNOSIS — B070 Plantar wart: Secondary | ICD-10-CM | POA: Diagnosis not present

## 2023-01-31 NOTE — Progress Notes (Signed)
   Chief Complaint  Patient presents with   Routine Post Op    Patient states everything is ok and it is not as painful when walking anymore, patient will also like if doctor can cut [place] off at the bottom of left foot .     Subjective: 28 y.o. male presenting today for follow-up evaluation of a symptomatic lesion to the plantar aspect of the left foot.    Brief history: Recent history of sepsis and flesh eating bacteria secondary to an injury while working at [employer] to the right leg.  He was in the hospital for about 6 weeks.  When he returned home from the hospital within a few weeks he had noticed a skin lesion developing to the plantar aspect of the left forefoot.  It is slowly become larger and very symptomatic with walking.   Past Medical History:  Diagnosis Date   ACL tear    right    Objective: Physical Exam General: The patient is alert and oriented x3 in no acute distress.   Dermatology: Overall improved however there continues to be hyperkeratotic skin lesion(s) noted to the plantar aspect of the left foot approximately 1 cm in diameter. Pinpoint bleeding noted upon debridement. Skin is warm, dry and supple bilateral lower extremities. Negative for open lesions or macerations.   Vascular: Palpable pedal pulses bilaterally. No edema or erythema noted. Capillary refill within normal limits.   Neurological: Grossly intact via light touch   Musculoskeletal Exam: Overall improvement.  There is some slight tenderness on palpation to the noted skin lesion(s).  Range of motion within normal limits to all pedal and ankle joints bilateral. Muscle strength 5/5 in all groups bilateral.    Assessment: #1 plantar wart left foot   Plan of Care:  -Patient was evaluated. -Excisional debridement of the plantar wart lesion(s) was performed using a chisel blade. Cantharone was reapplied and the lesion(s) was dressed with a dry sterile dressing. -Return to clinic 3 weeks  Dot Gazella,  DPM [facility]  Dr. Dot Gazella, DPM    [address]                                        [place], [place] [address]                Office [phone]  Fax [phone]

## 2023-02-02 ENCOUNTER — Telehealth: Payer: Self-pay | Admitting: Orthopedic Surgery

## 2023-02-02 NOTE — Telephone Encounter (Signed)
Per pt's worker's comp rep:  Are there orders for ongoing therapy?   Also, we will need the work status/restrictions addressed.     Ulice Brilliant, RN, BSN, CCM, COHN-S, [facility]  [employer] NCM,[employer]-Nurse Case Management  GRS NA Claims Workers Compensation     Pt is s/p RIGHT LEG, THIGH DEBRIDMENT AND FASCIOTOMY CLOSURE  10/27/22

## 2023-02-06 NOTE — Telephone Encounter (Signed)
Letter in pts chart that states no work restrictions. Tisha informed.

## 2023-02-17 DIAGNOSIS — Z419 Encounter for procedure for purposes other than remedying health state, unspecified: Secondary | ICD-10-CM | POA: Diagnosis not present

## 2023-02-21 ENCOUNTER — Ambulatory Visit (INDEPENDENT_AMBULATORY_CARE_PROVIDER_SITE_OTHER): Payer: Worker's Comp, Other unspecified | Admitting: Podiatry

## 2023-02-21 ENCOUNTER — Encounter: Payer: Self-pay | Admitting: Podiatry

## 2023-02-21 VITALS — Ht 72.0 in | Wt 157.2 lb

## 2023-02-21 DIAGNOSIS — D492 Neoplasm of unspecified behavior of bone, soft tissue, and skin: Secondary | ICD-10-CM | POA: Diagnosis not present

## 2023-02-21 DIAGNOSIS — B070 Plantar wart: Secondary | ICD-10-CM | POA: Diagnosis not present

## 2023-02-21 NOTE — Progress Notes (Signed)
   Chief Complaint  Patient presents with   Plantar Warts    Pt is here to f/u on plantar wart on left foot. Pt states his foot is a lot better to walk on.    Subjective: 28 y.o. male presenting today for follow-up evaluation of a symptomatic lesion to the plantar aspect of the left foot.  Overall the patient says that the foot feels significantly better.  He has no pain to the forefoot  Brief history: Recent history of sepsis and flesh eating bacteria secondary to an injury while working at [employer] to the right leg.  He was in the hospital for about 6 weeks.  When he returned home from the hospital within a few weeks he had noticed a skin lesion developing to the plantar aspect of the left forefoot.  It is slowly become larger and very symptomatic with walking.   Past Medical History:  Diagnosis Date   ACL tear    right    Objective: Physical Exam General: The patient is alert and oriented x3 in no acute distress.   Dermatology: Overall improved however there continues to be hyperkeratotic skin lesion(s) noted to the plantar aspect of the left foot approximately 1 cm in diameter. Pinpoint bleeding noted upon debridement. Skin is warm, dry and supple bilateral lower extremities. Negative for open lesions or macerations.   Vascular: Palpable pedal pulses bilaterally. No edema or erythema noted. Capillary refill within normal limits.   Neurological: Grossly intact via light touch   Musculoskeletal Exam: Overall improvement.  No tenderness on palpation to the noted skin lesion(s).  Range of motion within normal limits to all pedal and ankle joints bilateral. Muscle strength 5/5 in all groups bilateral.    Assessment: #1 plantar wart left foot   Plan of Care:  -Patient was evaluated. -Excisional debridement of the plantar wart lesion(s) was performed using a chisel blade.  Overall the lesion appears to be completely resolved -Return to clinic as needed  Thresa EMERSON Sar, DPM [facility]  Dr. Thresa EMERSON Sar, DPM    [address]                                        [place], [place] [address]                Office [phone]  Fax [phone]

## 2023-03-17 DIAGNOSIS — Z419 Encounter for procedure for purposes other than remedying health state, unspecified: Secondary | ICD-10-CM | POA: Diagnosis not present

## 2023-04-28 DIAGNOSIS — Z419 Encounter for procedure for purposes other than remedying health state, unspecified: Secondary | ICD-10-CM | POA: Diagnosis not present

## 2023-05-28 DIAGNOSIS — Z419 Encounter for procedure for purposes other than remedying health state, unspecified: Secondary | ICD-10-CM | POA: Diagnosis not present

## 2023-06-28 DIAGNOSIS — Z419 Encounter for procedure for purposes other than remedying health state, unspecified: Secondary | ICD-10-CM | POA: Diagnosis not present

## 2023-07-28 DIAGNOSIS — Z419 Encounter for procedure for purposes other than remedying health state, unspecified: Secondary | ICD-10-CM | POA: Diagnosis not present

## 2023-08-28 DIAGNOSIS — Z419 Encounter for procedure for purposes other than remedying health state, unspecified: Secondary | ICD-10-CM | POA: Diagnosis not present

## 2023-09-28 DIAGNOSIS — Z419 Encounter for procedure for purposes other than remedying health state, unspecified: Secondary | ICD-10-CM | POA: Diagnosis not present

## 2023-11-19 ENCOUNTER — Encounter: Payer: Self-pay | Admitting: Radiology

## 2023-12-28 DIAGNOSIS — Z419 Encounter for procedure for purposes other than remedying health state, unspecified: Secondary | ICD-10-CM | POA: Diagnosis not present
